# Patient Record
Sex: Female | Born: 1995 | Race: Black or African American | Hispanic: No | Marital: Single | Smoking: Never smoker
Health system: Southern US, Community
[De-identification: ages and names within clinical notes are randomized; demographics above are authoritative.]

## PROBLEM LIST (undated history)

## (undated) ENCOUNTER — Inpatient Hospital Stay (HOSPITAL_COMMUNITY): Payer: Self-pay

## (undated) DIAGNOSIS — J302 Other seasonal allergic rhinitis: Secondary | ICD-10-CM

## (undated) DIAGNOSIS — L910 Hypertrophic scar: Secondary | ICD-10-CM

## (undated) DIAGNOSIS — J40 Bronchitis, not specified as acute or chronic: Secondary | ICD-10-CM

## (undated) DIAGNOSIS — J45990 Exercise induced bronchospasm: Secondary | ICD-10-CM

## (undated) DIAGNOSIS — F329 Major depressive disorder, single episode, unspecified: Secondary | ICD-10-CM

## (undated) DIAGNOSIS — F419 Anxiety disorder, unspecified: Secondary | ICD-10-CM

## (undated) HISTORY — PX: EXTERNAL EAR SURGERY: SHX627

## (undated) HISTORY — DX: Hypertrophic scar: L91.0

## (undated) HISTORY — DX: Exercise induced bronchospasm: J45.990

## (undated) HISTORY — DX: Anxiety disorder, unspecified: F41.9

## (undated) HISTORY — DX: Other seasonal allergic rhinitis: J30.2

## (undated) HISTORY — PX: OTHER SURGICAL HISTORY: SHX169

## (undated) HISTORY — DX: Major depressive disorder, single episode, unspecified: F32.9

---
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
## 2016-02-07 ENCOUNTER — Emergency Department (HOSPITAL_COMMUNITY)
Admission: EM | Admit: 2016-02-07 | Discharge: 2016-02-08 | Disposition: A | Discharge status: Home / Self Care | Payer: Medicaid Other | Attending: Emergency Medicine | Admitting: Emergency Medicine

## 2016-02-07 ENCOUNTER — Encounter (HOSPITAL_COMMUNITY): Payer: Self-pay | Admitting: Emergency Medicine

## 2016-02-07 DIAGNOSIS — R109 Unspecified abdominal pain: Secondary | ICD-10-CM | POA: Insufficient documentation

## 2016-02-07 DIAGNOSIS — Z3202 Encounter for pregnancy test, result negative: Secondary | ICD-10-CM | POA: Insufficient documentation

## 2016-02-07 DIAGNOSIS — R110 Nausea: Secondary | ICD-10-CM | POA: Diagnosis not present

## 2016-02-07 DIAGNOSIS — F17200 Nicotine dependence, unspecified, uncomplicated: Secondary | ICD-10-CM | POA: Diagnosis not present

## 2016-02-07 DIAGNOSIS — Z7951 Long term (current) use of inhaled steroids: Secondary | ICD-10-CM | POA: Insufficient documentation

## 2016-02-07 DIAGNOSIS — R1030 Lower abdominal pain, unspecified: Secondary | ICD-10-CM | POA: Diagnosis present

## 2016-02-07 LAB — CBC
HCT: 37 % (ref 36.0–46.0)
Hemoglobin: 11.9 g/dL — ABNORMAL LOW (ref 12.0–15.0)
MCH: 28.1 pg (ref 26.0–34.0)
MCHC: 32.2 g/dL (ref 30.0–36.0)
MCV: 87.5 fL (ref 78.0–100.0)
PLATELETS: 211 10*3/uL (ref 150–400)
RBC: 4.23 MIL/uL (ref 3.87–5.11)
RDW: 12.7 % (ref 11.5–15.5)
WBC: 8.5 10*3/uL (ref 4.0–10.5)

## 2016-02-07 LAB — COMPREHENSIVE METABOLIC PANEL
ALT: 10 U/L — AB (ref 14–54)
AST: 24 U/L (ref 15–41)
Albumin: 4 g/dL (ref 3.5–5.0)
Alkaline Phosphatase: 69 U/L (ref 38–126)
Anion gap: 11 (ref 5–15)
BUN: 6 mg/dL (ref 6–20)
CALCIUM: 9.4 mg/dL (ref 8.9–10.3)
CHLORIDE: 105 mmol/L (ref 101–111)
CO2: 24 mmol/L (ref 22–32)
CREATININE: 0.72 mg/dL (ref 0.44–1.00)
Glucose, Bld: 96 mg/dL (ref 65–99)
Potassium: 3.8 mmol/L (ref 3.5–5.1)
Sodium: 140 mmol/L (ref 135–145)
Total Bilirubin: 0.6 mg/dL (ref 0.3–1.2)
Total Protein: 7.6 g/dL (ref 6.5–8.1)

## 2016-02-07 LAB — URINALYSIS, ROUTINE W REFLEX MICROSCOPIC
Bilirubin Urine: NEGATIVE
GLUCOSE, UA: NEGATIVE mg/dL
KETONES UR: NEGATIVE mg/dL
LEUKOCYTES UA: NEGATIVE
NITRITE: NEGATIVE
PROTEIN: NEGATIVE mg/dL
Specific Gravity, Urine: 1.029 (ref 1.005–1.030)
pH: 6.5 (ref 5.0–8.0)

## 2016-02-07 LAB — LIPASE, BLOOD: LIPASE: 21 U/L (ref 11–51)

## 2016-02-07 LAB — URINE MICROSCOPIC-ADD ON: WBC UA: NONE SEEN WBC/hpf (ref 0–5)

## 2016-02-07 LAB — POC URINE PREG, ED: Preg Test, Ur: NEGATIVE

## 2016-02-07 MED ORDER — IBUPROFEN 600 MG PO TABS: 600.0000 mg | ORAL_TABLET | Freq: Three times a day (TID) | ORAL | Status: DC | PRN

## 2016-02-07 MED ORDER — IBUPROFEN 400 MG PO TABS
600.0000 mg | ORAL_TABLET | Freq: Once | ORAL | Status: AC
  Administered 2016-02-07: 600 mg via ORAL
  Filled 2016-02-07: qty 1

## 2016-02-07 MED ORDER — HYDROCODONE-ACETAMINOPHEN 5-325 MG PO TABS
1.0000 | ORAL_TABLET | Freq: Once | ORAL | Status: AC
  Administered 2016-02-07: 1 via ORAL
  Filled 2016-02-07: qty 1

## 2016-02-07 MED ORDER — ONDANSETRON 8 MG PO TBDP: 8.0000 mg | ORAL_TABLET | Freq: Three times a day (TID) | ORAL | Status: DC | PRN

## 2016-02-07 NOTE — ED Provider Notes (Signed)
CSN: [id number]     Arrival date & time 02/07/16  1910 History  By signing my name below, I, Octavia Heirrianna Nassar, attest that this documentation has been prepared under the direction and in the presence of Azalia BilisKevin Eboney Claybrook, MD. Electronically Signed: Octavia HeirArianna Nassar, ED Scribe. 02/07/2016. 11:40 PM.    Chief Complaint  Patient presents with  . Abdominal Pain      The history is provided by the patient. No language interpreter was used.   HPI Comments: Kaitlin Montgomery is a 20 y.o. female who presents to the Emergency Department complaining of constant, gradual worsening, moderate lower abdominal cramps onset yesterday with associated nausea. Pt notes she started her menstrual cycle three days ago. She was having such intense cramps today that she was unable to go to work. Pt was supposed to get her depo shot on Feb 10th but missed her appointment and says she started bleeding shortly after. She says she is bleeding heavier than normal. Pt notes taking tramadol and ibuprofen to alleviate her pain with no relief. Denies fever, chills, dysuria, hematuria, or vomiting.  History reviewed. No pertinent past medical history. History reviewed. No pertinent past surgical history. Family History  Problem Relation Age of Onset  . Cancer Maternal Grandmother    Social History  Substance Use Topics  . Smoking status: Current Some Day Smoker  . Smokeless tobacco: None  . Alcohol Use: 0.0 oz/week    0 Standard drinks or equivalent per week   OB History    No data available     Review of Systems  A complete 10 system review of systems was obtained and all systems are negative except as noted in the HPI and PMH.    Allergies  Review of patient's allergies indicates no known allergies.  Home Medications   Prior to Admission medications   Medication Sig Start Date End Date Taking? Authorizing Provider  albuterol (PROVENTIL HFA;VENTOLIN HFA) 108 (90 BASE) MCG/ACT inhaler Inhale 2-4 puffs into the lungs  every 4 (four) hours as needed for wheezing (or cough). Patient not taking: Reported on 07/01/2015 04/09/15   Kalman JewelsShannon McQueen, MD  beclomethasone (QVAR) 80 MCG/ACT inhaler Inhale 1 puff into the lungs 2 (two) times daily. 07/01/15   Radene Gunningameron E Lang, MD  fluconazole (DIFLUCAN) 150 MG tablet Take 1 tablet and then take second tablet 72 hours later. Patient not taking: Reported on 07/01/2015 06/12/15   Radene Gunningameron E Lang, MD  FLUoxetine (PROZAC) 20 MG capsule Take 1 capsule (20 mg total) by mouth daily. 06/10/15 07/11/15  Kalman JewelsShannon McQueen, MD  ibuprofen (ADVIL,MOTRIN) 600 MG tablet Take 1 tab PO Q6h x 1-2 days then Q6h prn 08/17/15   Lowanda FosterMindy Brewer, NP   Triage vitals: BP 116/74 mmHg  Pulse 59  Temp(Src) 97.6 F (36.4 C) (Oral)  Resp 16  SpO2 98%  LMP 02/06/2016 Physical Exam  Constitutional: She is oriented to person, place, and time. She appears well-developed and well-nourished.  HENT:  Head: Normocephalic.  Eyes: EOM are normal.  Neck: Normal range of motion.  Pulmonary/Chest: Effort normal.  Abdominal: She exhibits no distension. There is no tenderness.  Musculoskeletal: Normal range of motion.  Neurological: She is alert and oriented to person, place, and time.  Psychiatric: She has a normal mood and affect.  Nursing note and vitals reviewed.   ED Course  Procedures  DIAGNOSTIC STUDIES: Oxygen Saturation is 98% on RA, normal by my interpretation.  COORDINATION OF CARE:  11:39 PM Discussed treatment plan which includes ibuprofen and  vicodin with pt at bedside and pt agreed to plan.  Labs Review Labs Reviewed  COMPREHENSIVE METABOLIC PANEL - Abnormal; Notable for the following:    ALT 10 (*)    All other components within normal limits  CBC - Abnormal; Notable for the following:    Hemoglobin 11.9 (*)    All other components within normal limits  URINALYSIS, ROUTINE W REFLEX MICROSCOPIC (NOT AT [facility]) - Abnormal; Notable for the following:    APPearance CLOUDY (*)    Hgb urine dipstick  LARGE (*)    All other components within normal limits  URINE MICROSCOPIC-ADD ON - Abnormal; Notable for the following:    Squamous Epithelial / LPF 0-5 (*)    Bacteria, UA RARE (*)    All other components within normal limits  LIPASE, BLOOD  POC URINE PREG, ED    Imaging Review No results found. I have personally reviewed and evaluated these images and lab results as part of my medical decision-making.   EKG Interpretation None      MDM   Final diagnoses:  None  Patient is overall well-appearing.  Vital signs are normal.  No significant abdominal tenderness to warrant additional workup at this time.  Likely related to menstrual cramping.  Pregnancy test is negative.  Labs are normal.  Discharge home in good condition.  Primary care and outpatient GYN follow-up.  She understands to return to the ER for new or worsening symptoms  I personally performed the services described in this documentation, which was scribed in my presence. The recorded information has been reviewed and is accurate.      Azalia Bilis, MD 02/07/16 [phone]

## 2016-02-07 NOTE — ED Notes (Signed)
Pt. reports low abdominal pain onset yesterday , denies nausea or vomitting , no fever or chills.

## 2016-02-07 NOTE — Discharge Instructions (Signed)

## 2016-02-07 NOTE — ED Notes (Signed)
Patient verbalized understanding of discharge instructions and denies any further needs or questions at this time. VS stable. Patient ambulatory with steady gait.  

## 2016-06-03 ENCOUNTER — Encounter: Payer: Self-pay | Admitting: Pediatrics

## 2016-06-04 ENCOUNTER — Encounter: Payer: Self-pay | Admitting: Pediatrics

## 2016-06-11 ENCOUNTER — Encounter (HOSPITAL_COMMUNITY): Payer: Self-pay | Admitting: *Deleted

## 2016-06-11 ENCOUNTER — Emergency Department (HOSPITAL_COMMUNITY)
Admission: EM | Admit: 2016-06-11 | Discharge: 2016-06-11 | Disposition: A | Discharge status: Home / Self Care | Payer: Medicaid Other | Attending: Emergency Medicine | Admitting: Emergency Medicine

## 2016-06-11 DIAGNOSIS — F17200 Nicotine dependence, unspecified, uncomplicated: Secondary | ICD-10-CM | POA: Diagnosis not present

## 2016-06-11 DIAGNOSIS — Z4800 Encounter for change or removal of nonsurgical wound dressing: Secondary | ICD-10-CM | POA: Insufficient documentation

## 2016-06-11 DIAGNOSIS — Z5189 Encounter for other specified aftercare: Secondary | ICD-10-CM

## 2016-06-11 MED ORDER — LIDOCAINE-EPINEPHRINE (PF) 2 %-1:200000 IJ SOLN
10.0000 mL | Freq: Once | INTRAMUSCULAR | Status: AC
  Administered 2016-06-11: 10 mL
  Filled 2016-06-11: qty 20

## 2016-06-11 NOTE — Discharge Instructions (Signed)
May wash wound with antibacterial soap and water. Otherwise, keep wound clean and dry. Avoid placing new jewelry in area until wound has healed. Follow up with your primary care doctor as needed. Return to the ED if you experience sever worsening of your symptoms, increased redness or swelling around your wound, fevers or chills.

## 2016-06-11 NOTE — ED Notes (Signed)
Patient able to ambulate independently  

## 2016-06-11 NOTE — ED Provider Notes (Signed)
[facility]-EMERGENCY DEPT Provider Note   CSN: [id number] Arrival date & time: 06/11/16  1528  First Provider Contact:  First MD Initiated Contact with Patient 06/11/16 1742   By signing my name below, I, Bridgette Habermann, attest that this documentation has been prepared under the direction and in the presence of Mertis Mosher, PA-C. Electronically Signed: Bridgette Habermann, ED Scribe. 06/11/16. 5:57 PM.  History   Chief Complaint Chief Complaint  Patient presents with  . Wound Check   HPI Comments: Kaitlin Montgomery is a 20 y.o. female who presents to the Emergency Department for mild swelling in the insertion site of her left lower back piercing that began to swell 2 weeks ago. Pt got the piercing a month ago. She thinks the area is "rejecting" the piercing. No drainage. Pt contacted the person who pierced the site and notes they told her to put Tylenol and witch hazel on the insertion site with no relief. Pt denies fever or any other associated symptoms. Pt denies any pain. Pt has no other complaints at this time.   The history is provided by the patient. No language interpreter was used.    History reviewed. No pertinent past medical history.  Patient Active Problem List   Diagnosis Date Noted  . Depressed mood 07/01/2015  . Anxiety 06/10/2015  . Birth control 04/09/2015  . 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 Seasonal allergies 04/09/2015  . Keloid 02/11/2015  . DUB (dysfunctional uterine bleeding) 11/30/2013    History reviewed. No pertinent surgical history.  OB History    No data available       Home Medications    Prior to Admission medications   Medication Sig Start Date End Date Taking? Authorizing Provider  ibuprofen (ADVIL,MOTRIN) 600 MG tablet Take 1 tablet (600 mg total) by mouth every 8 (eight) hours as needed. 02/07/16   Azalia Bilis, MD  ondansetron (ZOFRAN ODT) 8 MG disintegrating tablet Take 1 tablet (8 mg total) by mouth every 8 (eight) hours as needed for nausea or  vomiting. 02/07/16   Azalia Bilis, MD  traMADol (ULTRAM) 50 MG tablet Take 50 mg by mouth every 6 (six) hours as needed for moderate pain.    Historical Provider, MD    Family History Family History  Problem Relation Age of Onset  . Cancer Maternal Grandmother     Social History Social History  Substance Use Topics  . Smoking status: Current Some Day Smoker  . Smokeless tobacco: Not on file  . Alcohol use 0.0 oz/week     Allergies   Review of patient's allergies indicates no known allergies.   Review of Systems Review of Systems  Constitutional: Negative for fever.  Skin: Positive for wound.  All other systems reviewed and are negative.    Physical Exam Updated Vital Signs BP 107/72 (BP Location: Left Arm)   Pulse 80   Temp 98.1 F (36.7 C) (Oral)   Resp 14   LMP 06/03/2016   SpO2 99%   Physical Exam  Constitutional: She is oriented to person, place, and time. She appears well-developed and well-nourished. No distress.  HENT:  Head: Normocephalic and atraumatic.  Eyes: Conjunctivae are normal. Right eye exhibits no discharge. Left eye exhibits no discharge. No scleral icterus.  Cardiovascular: Normal rate.   Pulmonary/Chest: Effort normal.  Neurological: She is alert and oriented to person, place, and time. Coordination normal.  Skin: Skin is warm and dry. No rash noted. She is not diaphoretic. There is erythema. No pallor.  Right left  lower back dermal piercing with surrounding erythema.  Psychiatric: She has a normal mood and affect. Her behavior is normal.  Nursing note and vitals reviewed.    ED Treatments / Results  DIAGNOSTIC STUDIES: Oxygen Saturation is 99% on RA, normal by my interpretation.    COORDINATION OF CARE: 5:45 PM Discussed treatment plan with pt at bedside which includes removal  and pt agreed to plan.  Labs (all labs ordered are listed, but only abnormal results are displayed) Labs Reviewed - No data to display  EKG  EKG  Interpretation None       Radiology No results found.  Procedures .Foreign Body Removal Date/Time: 06/11/2016 6:15 PM Performed by: Gaylyn Rong TRIPP Authorized by: Gaylyn Rong TRIPP  Consent: Verbal consent obtained. Risks and benefits: risks, benefits and alternatives were discussed Consent given by: patient Patient understanding: patient states understanding of the procedure being performed Patient consent: the patient's understanding of the procedure matches consent given Procedure consent: procedure consent matches procedure scheduled Site marked: the operative site was marked Patient identity confirmed: verbally with patient Intake: Lower left back. Anesthesia: local infiltration  Anesthesia: Local Anesthetic: lidocaine 2% with epinephrine Patient restrained: no Patient cooperative: yes Complexity: simple 1 objects recovered. Objects recovered: Dermal piercing Post-procedure assessment: foreign body removed Patient tolerance: Patient tolerated the procedure well with no immediate complications   (including critical care time)  Medications Ordered in ED Medications - No data to display   Initial Impression / Assessment and Plan / ED Course  I have reviewed the triage vital signs and the nursing notes.  Pertinent labs & imaging results that were available during my care of the patient were reviewed by me and considered in my medical decision making (see chart for details).  Clinical Course    Patient presents with what appears to be an infected dermal piercing of her left lower back. Area was numbed with lidocaine and foreign body was removed. Patient tolerated the procedure well. No surrounding erythema. No abx indicated. Wound care discussed. Patient will follow-up with PCP as needed.  Final Clinical Impressions(s) / ED Diagnoses   Final diagnoses:  Visit for wound check    New Prescriptions New Prescriptions   No medications on file  I  personally performed the services described in this documentation, which was scribed in my presence. The recorded information has been reviewed and is accurate.      Lester Kinsman Taft, PA-C 06/12/16 4540    Arby Barrette, MD 06/13/16 [phone]

## 2016-06-11 NOTE — ED Triage Notes (Signed)
Pt has piercing to her left lower back and having mild swelling around insertion site. No redness or drainage noted.

## 2016-06-11 NOTE — ED Notes (Signed)
Suture cart at bedside 

## 2016-08-02 ENCOUNTER — Emergency Department (HOSPITAL_COMMUNITY): Payer: Medicaid Other

## 2016-08-02 ENCOUNTER — Encounter (HOSPITAL_COMMUNITY): Payer: Self-pay | Admitting: *Deleted

## 2016-08-02 ENCOUNTER — Emergency Department (HOSPITAL_COMMUNITY)
Admission: EM | Admit: 2016-08-02 | Discharge: 2016-08-02 | Disposition: A | Discharge status: Home / Self Care | Payer: Medicaid Other | Attending: Emergency Medicine | Admitting: Emergency Medicine

## 2016-08-02 DIAGNOSIS — F17200 Nicotine dependence, unspecified, uncomplicated: Secondary | ICD-10-CM | POA: Diagnosis not present

## 2016-08-02 DIAGNOSIS — J40 Bronchitis, not specified as acute or chronic: Secondary | ICD-10-CM | POA: Diagnosis not present

## 2016-08-02 DIAGNOSIS — R05 Cough: Secondary | ICD-10-CM | POA: Diagnosis present

## 2016-08-02 HISTORY — DX: Bronchitis, not specified as acute or chronic: J40

## 2016-08-02 MED ORDER — ALBUTEROL SULFATE HFA 108 (90 BASE) MCG/ACT IN AERS
2.0000 | INHALATION_SPRAY | Freq: Once | RESPIRATORY_TRACT | Status: AC
  Administered 2016-08-02: 2 via RESPIRATORY_TRACT
  Filled 2016-08-02: qty 6.7

## 2016-08-02 MED ORDER — PSEUDOEPHEDRINE HCL 30 MG PO TABS: 30.0000 mg | ORAL_TABLET | ORAL | 0 refills | Status: DC | PRN

## 2016-08-02 MED ORDER — PREDNISONE 20 MG PO TABS: 40.0000 mg | ORAL_TABLET | Freq: Every day | ORAL | 0 refills | Status: DC

## 2016-08-02 MED ORDER — CETIRIZINE HCL 10 MG PO TABS: 10.0000 mg | ORAL_TABLET | Freq: Every day | ORAL | 1 refills | Status: DC

## 2016-08-02 NOTE — ED Provider Notes (Signed)
[facility] Provider Note   CSN: [id number] Arrival date & time: 08/02/16  4098     History   Chief Complaint Chief Complaint  Patient presents with  . Cough    HPI Kaitlin Montgomery is a 20 y.o. female.  HPI Kaitlin Montgomery is a 20 y.o. female with history of seasonal allergies, exercise-induced asthma, bronchitis, presents to emergency department complaining of cough and wheezing. Patient states she has been coughing for 2 weeks. She reports associated wheezing and shortness of breath that is worse at nighttime. She reports nasal congestion and scratchy throat that is worse at nighttime and in the mornings. She has taken allergy pills and over-the-counter cold and flu medication which has not helped. She denies any known fever or chills. She reports some headache but probably because she can't sleep at night. She denies any difficulty with eating or drinking. No nausea or vomiting. No diarrhea. No chest pain.  Past Medical History:  Diagnosis Date  . Bronchitis     Patient Active Problem List   Diagnosis Date Noted  . Depressed mood 07/01/2015  . Anxiety 06/10/2015  . Birth control 04/09/2015  . 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 Seasonal allergies 04/09/2015  . Keloid 02/11/2015  . DUB (dysfunctional uterine bleeding) 11/30/2013    History reviewed. No pertinent surgical history.  OB History    No data available       Home Medications    Prior to Admission medications   Medication Sig Start Date End Date Taking? Authorizing Provider  ibuprofen (ADVIL,MOTRIN) 600 MG tablet Take 1 tablet (600 mg total) by mouth every 8 (eight) hours as needed. 02/07/16   Azalia Bilis, MD  ondansetron (ZOFRAN ODT) 8 MG disintegrating tablet Take 1 tablet (8 mg total) by mouth every 8 (eight) hours as needed for nausea or vomiting. 02/07/16   Azalia Bilis, MD  traMADol (ULTRAM) 50 MG tablet Take 50 mg by mouth every 6 (six) hours as needed for moderate pain.    Historical  Provider, MD    Family History Family History  Problem Relation Age of Onset  . Cancer Maternal Grandmother     Social History Social History  Substance Use Topics  . Smoking status: Current Some Day Smoker  . Smokeless tobacco: Never Used  . Alcohol use 0.0 oz/week     Allergies   Review of patient's allergies indicates no known allergies.   Review of Systems Review of Systems  Constitutional: Negative for chills and fever.  HENT: Positive for congestion, postnasal drip and sore throat. Negative for ear pain, mouth sores and voice change.   Respiratory: Positive for cough and wheezing. Negative for chest tightness and shortness of breath.   Cardiovascular: Negative for chest pain, palpitations and leg swelling.  Gastrointestinal: Negative for abdominal pain, diarrhea, nausea and vomiting.  Genitourinary: Negative for dysuria, flank pain, pelvic pain, vaginal bleeding, vaginal discharge and vaginal pain.  Musculoskeletal: Negative for arthralgias, myalgias, neck pain and neck stiffness.  Skin: Negative for rash.  Neurological: Negative for dizziness, weakness and headaches.  All other systems reviewed and are negative.    Physical Exam Updated Vital Signs BP 119/64 (BP Location: Left Arm)   Pulse 87   Temp 98.2 F (36.8 C) (Oral)   Resp 18   Ht 5\' 7"  (1.702 m)   Wt 68 kg   LMP 07/12/2016   SpO2 99%   BMI 23.49 kg/m   Physical Exam  Constitutional: She appears well-developed and well-nourished. No distress.  HENT:  Head: Normocephalic.  Right Ear: Tympanic membrane, external ear and ear canal normal.  Left Ear: Tympanic membrane, external ear and ear canal normal.  Nose: Mucosal edema and rhinorrhea present.  Mouth/Throat: Uvula is midline, oropharynx is clear and moist and mucous membranes are normal. No oropharyngeal exudate, posterior oropharyngeal edema, posterior oropharyngeal erythema or tonsillar abscesses.  Eyes: Conjunctivae are normal.  Neck: Neck  supple.  Cardiovascular: Normal rate, regular rhythm and normal heart sounds.   Pulmonary/Chest: Effort normal. No respiratory distress. She has wheezes. She has no rales.  Abdominal: Soft. Bowel sounds are normal. She exhibits no distension. There is no tenderness. There is no rebound.  Musculoskeletal: She exhibits no edema.  Neurological: She is alert.  Skin: Skin is warm and dry.  Psychiatric: She has a normal mood and affect. Her behavior is normal.  Nursing note and vitals reviewed.    ED Treatments / Results  Labs (all labs ordered are listed, but only abnormal results are displayed) Labs Reviewed - No data to display  EKG  EKG Interpretation None       Radiology Dg Chest 2 View  Result Date: 08/02/2016 CLINICAL DATA:  Cough and shortness of breath for the past 2 weeks. Smoker. EXAM: CHEST  2 VIEW COMPARISON:  01/03/2015. FINDINGS: Normal sized heart. Clear lungs. Mild diffuse peribronchial thickening. Unremarkable bones. IMPRESSION: Mild bronchitic changes. Electronically Signed   By: Beckie SaltsSteven  Reid M.D.   On: 08/02/2016 09:40    Procedures Procedures (including critical care time)  Medications Ordered in ED Medications  albuterol (PROVENTIL HFA;VENTOLIN HFA) 108 (90 Base) MCG/ACT inhaler 2 puff (not administered)     Initial Impression / Assessment and Plan / ED Course  I have reviewed the triage vital signs and the nursing notes.  Pertinent labs & imaging results that were available during my care of the patient were reviewed by me and considered in my medical decision making (see chart for details).  Clinical Course    Patient emergency department with nasal congestion, sore throat, cough. Symptoms for 2 weeks. Given the duration of the symptoms, will get chest x-ray to rule out pneumonia. Patient does have mild wheezing on exam, however normal vital signs. I will order her an inhaler.  cxr showing bronchitic changes. Patient is also complaining of some  allergy type symptoms. Will start on Zyrtec which she states has worked in the past, Sudafed for congestion, short course of prednisone for wheezing and cough, inhaler provided. Home with close outpatient follow-up. Patient discharged in stable condition with normal vital signs. No acute distress.  Vitals:   08/02/16 0650 08/02/16 0944  BP: 119/64 110/62  Pulse: 87 71  Resp: 18 18  Temp: 98.2 F (36.8 C) 98.1 F (36.7 C)  TempSrc: Oral Oral  SpO2: 99% 100%  Weight: 68 kg   Height: 5\' 7"  (1.702 m)      Final Clinical Impressions(s) / ED Diagnoses   Final diagnoses:  Bronchitis    New Prescriptions Discharge Medication List as of 08/02/2016  9:49 AM    START taking these medications   Details  cetirizine (ZYRTEC ALLERGY) 10 MG tablet Take 1 tablet (10 mg total) by mouth daily., Starting Sun 08/02/2016, Print    predniSONE (DELTASONE) 20 MG tablet Take 2 tablets (40 mg total) by mouth daily., Starting Sun 08/02/2016, Print    pseudoephedrine (SUDAFED) 30 MG tablet Take 1 tablet (30 mg total) by mouth every 4 (four) hours as needed for congestion., Starting Sun 08/02/2016, Print  Jaynie Crumble, PA-C 08/02/16 1142    Glynn Octave, MD 08/02/16 1243

## 2016-08-02 NOTE — Discharge Instructions (Signed)
Start zyrtec daily. Take sudafed in addition for congestion. Use inhaler 2 puffs every 4 hrs or as needed for wheezing and shortness of breath. Prednisone as prescribed until all gone. Follow up with your doctor for recheck.

## 2016-08-02 NOTE — ED Triage Notes (Signed)
The pt has had a cough for 2 weeks  She  Has a cold and is coughing up green thin sputum  She is a smoker  lmp sept 1st

## 2016-08-02 NOTE — ED Notes (Signed)
Pt escorted to xray.

## 2016-08-23 ENCOUNTER — Emergency Department (HOSPITAL_COMMUNITY)
Admission: EM | Admit: 2016-08-23 | Discharge: 2016-08-23 | Disposition: A | Discharge status: Home / Self Care | Payer: Medicaid Other | Attending: Emergency Medicine | Admitting: Emergency Medicine

## 2016-08-23 ENCOUNTER — Encounter (HOSPITAL_COMMUNITY): Payer: Self-pay | Admitting: *Deleted

## 2016-08-23 DIAGNOSIS — O26891 Other specified pregnancy related conditions, first trimester: Secondary | ICD-10-CM | POA: Insufficient documentation

## 2016-08-23 DIAGNOSIS — Z3A01 Less than 8 weeks gestation of pregnancy: Secondary | ICD-10-CM | POA: Diagnosis not present

## 2016-08-23 DIAGNOSIS — R109 Unspecified abdominal pain: Secondary | ICD-10-CM | POA: Diagnosis not present

## 2016-08-23 DIAGNOSIS — Z5321 Procedure and treatment not carried out due to patient leaving prior to being seen by health care provider: Secondary | ICD-10-CM | POA: Insufficient documentation

## 2016-08-23 LAB — COMPREHENSIVE METABOLIC PANEL
ALK PHOS: 47 U/L (ref 38–126)
ALT: 8 U/L — AB (ref 14–54)
AST: 17 U/L (ref 15–41)
Albumin: 4 g/dL (ref 3.5–5.0)
Anion gap: 7 (ref 5–15)
BUN: 6 mg/dL (ref 6–20)
CALCIUM: 9.4 mg/dL (ref 8.9–10.3)
CO2: 22 mmol/L (ref 22–32)
CREATININE: 0.67 mg/dL (ref 0.44–1.00)
Chloride: 106 mmol/L (ref 101–111)
Glucose, Bld: 88 mg/dL (ref 65–99)
Potassium: 3.9 mmol/L (ref 3.5–5.1)
Sodium: 135 mmol/L (ref 135–145)
Total Bilirubin: 0.4 mg/dL (ref 0.3–1.2)
Total Protein: 6.8 g/dL (ref 6.5–8.1)

## 2016-08-23 LAB — URINALYSIS, ROUTINE W REFLEX MICROSCOPIC
Bilirubin Urine: NEGATIVE
Glucose, UA: NEGATIVE mg/dL
HGB URINE DIPSTICK: NEGATIVE
KETONES UR: NEGATIVE mg/dL
Nitrite: NEGATIVE
PROTEIN: NEGATIVE mg/dL
Specific Gravity, Urine: 1.02 (ref 1.005–1.030)
pH: 7 (ref 5.0–8.0)

## 2016-08-23 LAB — CBC
HCT: 33.7 % — ABNORMAL LOW (ref 36.0–46.0)
Hemoglobin: 11 g/dL — ABNORMAL LOW (ref 12.0–15.0)
MCH: 28.3 pg (ref 26.0–34.0)
MCHC: 32.6 g/dL (ref 30.0–36.0)
MCV: 86.6 fL (ref 78.0–100.0)
PLATELETS: 197 10*3/uL (ref 150–400)
RBC: 3.89 MIL/uL (ref 3.87–5.11)
RDW: 13.3 % (ref 11.5–15.5)
WBC: 8.7 10*3/uL (ref 4.0–10.5)

## 2016-08-23 LAB — LIPASE, BLOOD: Lipase: 22 U/L (ref 11–51)

## 2016-08-23 LAB — URINE MICROSCOPIC-ADD ON

## 2016-08-23 LAB — HCG, QUANTITATIVE, PREGNANCY: HCG, BETA CHAIN, QUANT, S: 35777 m[IU]/mL — AB (ref <5)

## 2016-08-23 LAB — POC URINE PREG, ED: Preg Test, Ur: POSITIVE — AB

## 2016-08-23 NOTE — ED Notes (Signed)
Registration states pt left 

## 2016-08-23 NOTE — ED Triage Notes (Signed)
Ptc/o lower abd and back pain x 3 weeks. Reports blood on tissues one one episode. Pt has had a positive preg test at home. LMP 9/2

## 2016-08-27 ENCOUNTER — Ambulatory Visit (INDEPENDENT_AMBULATORY_CARE_PROVIDER_SITE_OTHER): Payer: Medicaid Other | Admitting: *Deleted

## 2016-08-27 DIAGNOSIS — Z3201 Encounter for pregnancy test, result positive: Secondary | ICD-10-CM | POA: Diagnosis not present

## 2016-08-27 DIAGNOSIS — N912 Amenorrhea, unspecified: Secondary | ICD-10-CM

## 2016-08-27 LAB — POCT URINE PREGNANCY: Preg Test, Ur: POSITIVE — AB

## 2016-09-14 ENCOUNTER — Other Ambulatory Visit (HOSPITAL_COMMUNITY)
Admission: RE | Admit: 2016-09-14 | Discharge: 2016-09-14 | Disposition: A | Discharge status: Home / Self Care | Payer: Medicaid Other | Source: Ambulatory Visit | Attending: Obstetrics & Gynecology | Admitting: Obstetrics & Gynecology

## 2016-09-14 ENCOUNTER — Encounter: Payer: Self-pay | Admitting: Obstetrics & Gynecology

## 2016-09-14 ENCOUNTER — Ambulatory Visit (INDEPENDENT_AMBULATORY_CARE_PROVIDER_SITE_OTHER): Payer: Medicaid Other | Admitting: Obstetrics & Gynecology

## 2016-09-14 VITALS — BP 119/79 | HR 106 | Temp 97.4°F | Wt 153.8 lb

## 2016-09-14 DIAGNOSIS — Z113 Encounter for screening for infections with a predominantly sexual mode of transmission: Secondary | ICD-10-CM | POA: Insufficient documentation

## 2016-09-14 DIAGNOSIS — Z3401 Encounter for supervision of normal first pregnancy, first trimester: Secondary | ICD-10-CM

## 2016-09-14 DIAGNOSIS — Z3400 Encounter for supervision of normal first pregnancy, unspecified trimester: Secondary | ICD-10-CM | POA: Insufficient documentation

## 2016-09-14 DIAGNOSIS — O219 Vomiting of pregnancy, unspecified: Secondary | ICD-10-CM

## 2016-09-14 LAB — POCT URINALYSIS DIPSTICK
BILIRUBIN UA: NEGATIVE
Blood, UA: NEGATIVE
Glucose, UA: NEGATIVE
KETONES UA: NEGATIVE
NITRITE UA: NEGATIVE
PROTEIN UA: NEGATIVE
Spec Grav, UA: <=1.005
Urobilinogen, UA: NEGATIVE
pH, UA: 8

## 2016-09-14 MED ORDER — PROMETHAZINE HCL 25 MG PO TABS: 25.0000 mg | ORAL_TABLET | Freq: Four times a day (QID) | ORAL | 2 refills | Status: DC | PRN

## 2016-09-14 MED ORDER — PREPLUS 27-1 MG PO TABS: 1.0000 | ORAL_TABLET | Freq: Every day | ORAL | 13 refills | Status: DC

## 2016-09-14 NOTE — Patient Instructions (Signed)
Thank you for enrolling in MyChart. Please follow the instructions below to securely access your online medical record. MyChart allows you to send messages to your doctor, view your test results, manage appointments, and more.   How Do I Sign Up? 1. In your Internet browser, go to [employer] and enter [email or web address]. 2. Click on the Sign Up Now link in the Sign In box. You will see the New Member Sign Up page. 3. Enter your MyChart Access Code exactly as it appears below. You will not need to use this code after you've completed the sign-up process. If you do not sign up before the expiration date, you must request a new code.  MyChart Access Code: [email or web address] Expires: 10/26/2016 10:30 AM  4. Enter your Social Security Number ([id number]) and Date of Birth (mm/dd/yyyy) as indicated and click Submit. You will be taken to the next sign-up page. 5. Create a MyChart ID. This will be your MyChart login ID and cannot be changed, so think of one that is secure and easy to remember. 6. Create a MyChart password. You can change your password at any time. 7. Enter your Password Reset Question and Answer. This can be used at a later time if you forget your password.  8. Enter your e-mail address. You will receive e-mail notification when new information is available in MyChart. 9. Click Sign Up. You can now view your medical record.   Additional Information Remember, MyChart is NOT to be used for urgent needs. For medical emergencies, dial 911.   First Trimester of Pregnancy The first trimester of pregnancy is from week 1 until the end of week 12 (months 1 through 3). A week after a sperm fertilizes an egg, the egg will implant on the wall of the uterus. This embryo will begin to develop into a baby. Genes from you and your partner are forming the baby. The female genes determine whether the baby is a boy or a girl. At 6-8 weeks, the eyes and face are formed, and the heartbeat  can be seen on ultrasound. At the end of 12 weeks, all the baby's organs are formed.  Now that you are pregnant, you will want to do everything you can to have a healthy baby. Two of the most important things are to get good prenatal care and to follow your health care provider's instructions. Prenatal care is all the medical care you receive before the baby's birth. This care will help prevent, find, and treat any problems during the pregnancy and childbirth. BODY CHANGES Your body goes through many changes during pregnancy. The changes vary from woman to woman.   You may gain or lose a couple of pounds at first.  You may feel sick to your stomach (nauseous) and throw up (vomit). If the vomiting is uncontrollable, call your health care provider.  You may tire easily.  You may develop headaches that can be relieved by medicines approved by your health care provider.  You may urinate more often. Painful urination may mean you have a bladder infection.  You may develop heartburn as a result of your pregnancy.  You may develop constipation because certain hormones are causing the muscles that push waste through your intestines to slow down.  You may develop hemorrhoids or swollen, bulging veins (varicose veins).  Your breasts may begin to grow larger and become tender. Your nipples may stick out more, and the tissue that surrounds them (areola) may become darker.  Your gums may bleed  and may be sensitive to brushing and flossing.  Dark spots or blotches (chloasma, mask of pregnancy) may develop on your face. This will likely fade after the baby is born.  Your menstrual periods will stop.  You may have a loss of appetite.  You may develop cravings for certain kinds of food.  You may have changes in your emotions from day to day, such as being excited to be pregnant or being concerned that something may go wrong with the pregnancy and baby.  You may have more vivid and strange  dreams.  You may have changes in your hair. These can include thickening of your hair, rapid growth, and changes in texture. Some women also have hair loss during or after pregnancy, or hair that feels dry or thin. Your hair will most likely return to normal after your baby is born. WHAT TO EXPECT AT YOUR PRENATAL VISITS During a routine prenatal visit:  You will be weighed to make sure you and the baby are growing normally.  Your blood pressure will be taken.  Your abdomen will be measured to track your baby's growth.  The fetal heartbeat will be listened to starting around week 10 or 12 of your pregnancy.  Test results from any previous visits will be discussed. Your health care provider may ask you:  How you are feeling.  If you are feeling the baby move.  If you have had any abnormal symptoms, such as leaking fluid, bleeding, severe headaches, or abdominal cramping.  If you are using any tobacco products, including cigarettes, chewing tobacco, and electronic cigarettes.  If you have any questions. Other tests that may be performed during your first trimester include:  Blood tests to find your blood type and to check for the presence of any previous infections. They will also be used to check for low iron levels (anemia) and Rh antibodies. Later in the pregnancy, blood tests for diabetes will be done along with other tests if problems develop.  Urine tests to check for infections, diabetes, or protein in the urine.  An ultrasound to confirm the proper growth and development of the baby.  An amniocentesis to check for possible genetic problems.  Fetal screens for spina bifida and Down syndrome.  You may need other tests to make sure you and the baby are doing well.  HIV (human immunodeficiency virus) testing. Routine prenatal testing includes screening for HIV, unless you choose not to have this test. HOME CARE INSTRUCTIONS  Medicines  Follow your health care provider's  instructions regarding medicine use. Specific medicines may be either safe or unsafe to take during pregnancy.  Take your prenatal vitamins as directed.  If you develop constipation, try taking a stool softener if your health care provider approves. Diet  Eat regular, well-balanced meals. Choose a variety of foods, such as meat or vegetable-based protein, fish, milk and low-fat dairy products, vegetables, fruits, and whole grain breads and cereals. Your health care provider will help you determine the amount of weight gain that is right for you.  Avoid raw meat and uncooked cheese. These carry germs that can cause birth defects in the baby.  Eating four or five small meals rather than three large meals a day may help relieve nausea and vomiting. If you start to feel nauseous, eating a few soda crackers can be helpful. Drinking liquids between meals instead of during meals also seems to help nausea and vomiting.  If you develop constipation, eat more high-fiber foods, such as  fresh vegetables or fruit and whole grains. Drink enough fluids to keep your urine clear or pale yellow. Activity and Exercise  Exercise only as directed by your health care provider. Exercising will help you:  Control your weight.  Stay in shape.  Be prepared for labor and delivery.  Experiencing pain or cramping in the lower abdomen or low back is a good sign that you should stop exercising. Check with your health care provider before continuing normal exercises.  Try to avoid standing for long periods of time. Move your legs often if you must stand in one place for a long time.  Avoid heavy lifting.  Wear low-heeled shoes, and practice good posture.  You may continue to have sex unless your health care provider directs you otherwise. Relief of Pain or Discomfort  Wear a good support bra for breast tenderness.   Take warm sitz baths to soothe any pain or discomfort caused by hemorrhoids. Use hemorrhoid  cream if your health care provider approves.   Rest with your legs elevated if you have leg cramps or low back pain.  If you develop varicose veins in your legs, wear support hose. Elevate your feet for 15 minutes, 3-4 times a day. Limit salt in your diet. Prenatal Care  Schedule your prenatal visits by the twelfth week of pregnancy. They are usually scheduled monthly at first, then more often in the last 2 months before delivery.  Write down your questions. Take them to your prenatal visits.  Keep all your prenatal visits as directed by your health care provider. Safety  Wear your seat belt at all times when driving.  Make a list of emergency phone numbers, including numbers for family, friends, the hospital, and police and fire departments. General Tips  Ask your health care provider for a referral to a local prenatal education class. Begin classes no later than at the beginning of month 6 of your pregnancy.  Ask for help if you have counseling or nutritional needs during pregnancy. Your health care provider can offer advice or refer you to specialists for help with various needs.  Do not use hot tubs, steam rooms, or saunas.  Do not douche or use tampons or scented sanitary pads.  Do not cross your legs for long periods of time.  Avoid cat litter boxes and soil used by cats. These carry germs that can cause birth defects in the baby and possibly loss of the fetus by miscarriage or stillbirth.  Avoid all smoking, herbs, alcohol, and medicines not prescribed by your health care provider. Chemicals in these affect the formation and growth of the baby.  Do not use any tobacco products, including cigarettes, chewing tobacco, and electronic cigarettes. If you need help quitting, ask your health care provider. You may receive counseling support and other resources to help you quit.  Schedule a dentist appointment. At home, brush your teeth with a soft toothbrush and be gentle when you  floss. SEEK MEDICAL CARE IF:   You have dizziness.  You have mild pelvic cramps, pelvic pressure, or nagging pain in the abdominal area.  You have persistent nausea, vomiting, or diarrhea.  You have a bad smelling vaginal discharge.  You have pain with urination.  You notice increased swelling in your face, hands, legs, or ankles. SEEK IMMEDIATE MEDICAL CARE IF:   You have a fever.  You are leaking fluid from your vagina.  You have spotting or bleeding from your vagina.  You have severe abdominal cramping or  pain.  You have rapid weight gain or loss.  You vomit blood or material that looks like coffee grounds.  You are exposed to [place] measles and have never had them.  You are exposed to fifth disease or chickenpox.  You develop a severe headache.  You have shortness of breath.  You have any kind of trauma, such as from a fall or a car accident.   This information is not intended to replace advice given to you by your health care provider. Make sure you discuss any questions you have with your health care provider.   Document Released: 10/20/2001 Document Revised: 11/16/2014 Document Reviewed: 09/05/2013 Elsevier Interactive Patient Education [employer]2016 Elsevier Inc.

## 2016-09-14 NOTE — Addendum Note (Signed)
Addended by: Natale MilchSTALLING, Ellamae Lybeck D on: 09/14/2016 02:46 PM   Modules accepted: Orders

## 2016-09-14 NOTE — Progress Notes (Signed)
Subjective:   Nicolette BangDestiny Rodelo is a 20 y.o. G1P0 at 7227w3d by LMP being seen today for her first obstetrical visit.  Her obstetrical history is significant for being a teenager. Patient does intend to breast feed. Pregnancy history fully reviewed.  Patient reports nausea and vomiting, desires other medication as Vitamin B6 + Unisom is not working.  HISTORY: Obstetric History   G1   P0   T0   P0   A0   L0    SAB0   TAB0   Ectopic0   Multiple0   Live Births0     # Outcome Date GA Lbr Len/2nd Weight Sex Delivery Anes PTL Lv  1 Current              Past Medical History:  Diagnosis Date  . Anxiety 06/10/2015  . Bronchitis   . Bronchitis   . Depressed mood 07/01/2015  . 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 Keloid 02/11/2015  . Seasonal allergies 04/09/2015   History reviewed. No pertinent surgical history. Family History  Problem Relation Age of Onset  . Cancer Maternal Grandmother    Social History  Substance Use Topics  . Smoking status: Former Smoker    Types: Cigars    Quit date: 08/25/2016  . Smokeless tobacco: Never Used  . Alcohol use No   No Known Allergies Current Outpatient Prescriptions on File Prior to Visit  Medication Sig Dispense Refill  . cetirizine (ZYRTEC ALLERGY) 10 MG tablet Take 1 tablet (10 mg total) by mouth daily. (Patient not taking: Reported on 09/14/2016) 30 tablet 1  . ibuprofen (ADVIL,MOTRIN) 600 MG tablet Take 1 tablet (600 mg total) by mouth every 8 (eight) hours as needed. (Patient not taking: Reported on 09/14/2016) 15 tablet 0  . ondansetron (ZOFRAN ODT) 8 MG disintegrating tablet Take 1 tablet (8 mg total) by mouth every 8 (eight) hours as needed for nausea or vomiting. (Patient not taking: Reported on 09/14/2016) 12 tablet 0  . predniSONE (DELTASONE) 20 MG tablet Take 2 tablets (40 mg total) by mouth daily. (Patient not taking: Reported on 09/14/2016) 10 tablet 0  . pseudoephedrine (SUDAFED) 30 MG tablet Take 1 tablet (30 mg total) by mouth every  4 (four) hours as needed for congestion. (Patient not taking: Reported on 09/14/2016) 15 tablet 0  . traMADol (ULTRAM) 50 MG tablet Take 50 mg by mouth every 6 (six) hours as needed for moderate pain.     No current facility-administered medications on file prior to visit.      Exam   Vitals:   09/14/16 1355  BP: 119/79  Pulse: (!) 106  Temp: 97.4 F (36.3 C)  Weight: 153 lb 12.8 oz (69.8 kg)   Fetal Heart Rate (bpm): + on u/s. Bedside u/s showed SIUP with +heartbeat.  System: Breast:  normal appearance, no masses or tenderness   Skin: normal coloration and turgor, no rashes   Neurologic: oriented, normal, negative, normal mood   Extremities: normal strength, tone, and muscle mass, ROM of all joints is normal   HEENT PERRLA, extra ocular movement intact and sclera clear, anicteric   Mouth/Teeth mucous membranes moist, pharynx normal without lesions and dental hygiene good   Neck supple and no masses   Cardiovascular: regular rate and rhythm   Respiratory:  appears well, vitals normal, no respiratory distress, normal breath sounds   Abdomen: soft, non-tender; bowel sounds normal; no masses,  no organomegaly     Assessment:   Pregnancy: G1P0 Patient Active Problem  List   Diagnosis Date Noted  . Supervision of normal first teen pregnancy 09/14/2016  . Depressed mood 07/01/2015  . Anxiety 06/10/2015     Plan:  1. Nausea and vomiting during pregnancy Antiemetic prescribed. - promethazine (PHENERGAN) 25 MG tablet; Take 1 tablet (25 mg total) by mouth every 6 (six) hours as needed for nausea or vomiting.  Dispense: 30 tablet; Refill: 2  2. Supervision of normal first teen pregnancy in first trimester Initial labs drawn, prenatal vitamins prescribed. - GC/Chlamydia probe amp ([place] - Culture, OB Urine - ToxASSURE Select 13 (MW), Urine - Hemoglobinopathy evaluation - US MFM Fetal Nuchal Translucency; Future - Obstetric Panel, Including HIV - Prenatal  Vit-Fe Fumarate-FA (PREPLUS) 27-1 MG TABS; Take 1 tablet by mouth daily.  Dispense: 30 tablet; Refill: 13 Genetic Screening discussed, First trimester screen: ordered. Ultrasound discussed; fetal anatomic survey: to be ordered later. Problem list reviewed and updated. The nature [facility] with multiple MDs and other Advanced Practice Providers was explained to patient; also emphasized that residents, students are part of our team. Routine obstetric precautions reviewed. Follow up in 4 weeks.    Jaynie CollinsUGONNA  ANYANWU, MD, FACOG Attending Obstetrician & Gynecologist, [facility], [facility]

## 2016-09-15 LAB — GC/CHLAMYDIA PROBE AMP (~~LOC~~) NOT AT ARMC
CHLAMYDIA, DNA PROBE: NEGATIVE
Neisseria Gonorrhea: NEGATIVE

## 2016-09-16 ENCOUNTER — Encounter (HOSPITAL_COMMUNITY): Payer: Self-pay | Admitting: *Deleted

## 2016-09-16 ENCOUNTER — Inpatient Hospital Stay (HOSPITAL_COMMUNITY)
Admission: AD | Admit: 2016-09-16 | Discharge: 2016-09-16 | Disposition: A | Discharge status: Home / Self Care | Payer: Medicaid Other | Source: Ambulatory Visit | Attending: Obstetrics & Gynecology | Admitting: Obstetrics & Gynecology

## 2016-09-16 DIAGNOSIS — Y93E1 Activity, personal bathing and showering: Secondary | ICD-10-CM | POA: Insufficient documentation

## 2016-09-16 DIAGNOSIS — R109 Unspecified abdominal pain: Secondary | ICD-10-CM | POA: Diagnosis present

## 2016-09-16 DIAGNOSIS — O209 Hemorrhage in early pregnancy, unspecified: Secondary | ICD-10-CM | POA: Diagnosis not present

## 2016-09-16 DIAGNOSIS — O9A211 Injury, poisoning and certain other consequences of external causes complicating pregnancy, first trimester: Secondary | ICD-10-CM

## 2016-09-16 DIAGNOSIS — W182XXA Fall in (into) shower or empty bathtub, initial encounter: Secondary | ICD-10-CM | POA: Insufficient documentation

## 2016-09-16 DIAGNOSIS — O26891 Other specified pregnancy related conditions, first trimester: Secondary | ICD-10-CM

## 2016-09-16 DIAGNOSIS — Z87891 Personal history of nicotine dependence: Secondary | ICD-10-CM | POA: Insufficient documentation

## 2016-09-16 DIAGNOSIS — Z3A09 9 weeks gestation of pregnancy: Secondary | ICD-10-CM | POA: Diagnosis not present

## 2016-09-16 LAB — URINALYSIS, ROUTINE W REFLEX MICROSCOPIC
BILIRUBIN URINE: NEGATIVE
GLUCOSE, UA: NEGATIVE mg/dL
Hgb urine dipstick: NEGATIVE
KETONES UR: 40 mg/dL — AB
Nitrite: NEGATIVE
PROTEIN: NEGATIVE mg/dL
Specific Gravity, Urine: 1.015 (ref 1.005–1.030)
pH: 7 (ref 5.0–8.0)

## 2016-09-16 LAB — URINE MICROSCOPIC-ADD ON

## 2016-09-16 NOTE — MAU Note (Signed)
Pt states she fell getting out of the shower last night, hit chest & abdomen on the toilet.  Started having lower abd pain last night, took tylenol.  Started vomiting & spotting this a.m., pain continues.

## 2016-09-16 NOTE — MAU Provider Note (Signed)
Faculty Practice OB/GYN MAU Attending Note  History     CSN: [id number]  Arrival date & time 09/16/16  1657   First Provider Initiated Contact with Patient 09/16/16 1836      Chief Complaint  Patient presents with  . Fall  . Abdominal Pain    Kaitlin Montgomery is a 20 y.o. G2P0010 at 301w5d who presents to MAU today for evaluation of vaginal spotting, abdominal pain after fall.  [facility] for initial OB visit. According to patient and RN note: "She fell getting out of the shower last night, hit chest & abdomen on the toilet.  Started having lower abdominal pain last night, took tylenol.  Started vomiting & spotting this a.m., pain continues." She currently complains of cramping, and some nausea.  Had some brown spotting, no current bleeding currently.  She is very worried about baby.  Accompanied by her mother. Denies any abnormal vaginal discharge, fevers, chills, sweats, dysuria, other GI or GU symptoms or other general symptoms.   Obstetric History   G2   P0   T0   P0   A1   L0    SAB1   TAB0   Ectopic0   Multiple0   Live Births0     # Outcome Date GA Lbr Len/2nd Weight Sex Delivery Anes PTL Lv  2 Current           1 SAB               Past Medical History:  Diagnosis Date  . Anxiety 06/10/2015  . Bronchitis   . Bronchitis   . Depressed mood 07/01/2015  . 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 Keloid 02/11/2015  . Seasonal allergies 04/09/2015    Past Surgical History:  Procedure Laterality Date  . EXTERNAL EAR SURGERY Right     Family History  Problem Relation Age of Onset  . Cancer Maternal Grandmother     Social History  Substance Use Topics  . Smoking status: Former Smoker    Types: Cigars    Quit date: 08/25/2016  . Smokeless tobacco: Never Used  . Alcohol use No    No Known Allergies  Prescriptions Prior to Admission  Medication Sig Dispense Refill Last Dose  . acetaminophen (TYLENOL) 325 MG tablet Take 325 mg by mouth every 6 (six)  hours as needed for moderate pain.   09/15/2016 at Unknown time  . Meclizine HCl (BONINE PO) Take 1 tablet by mouth daily as needed (motion sickness).   Past Week at Unknown time  . cetirizine (ZYRTEC ALLERGY) 10 MG tablet Take 1 tablet (10 mg total) by mouth daily. (Patient not taking: Reported on 09/14/2016) 30 tablet 1 Not Taking  . ibuprofen (ADVIL,MOTRIN) 600 MG tablet Take 1 tablet (600 mg total) by mouth every 8 (eight) hours as needed. (Patient not taking: Reported on 09/14/2016) 15 tablet 0 Not Taking  . ondansetron (ZOFRAN ODT) 8 MG disintegrating tablet Take 1 tablet (8 mg total) by mouth every 8 (eight) hours as needed for nausea or vomiting. (Patient not taking: Reported on 09/14/2016) 12 tablet 0 Not Taking  . predniSONE (DELTASONE) 20 MG tablet Take 2 tablets (40 mg total) by mouth daily. (Patient not taking: Reported on 09/14/2016) 10 tablet 0 Not Taking  . Prenatal Vit-Fe Fumarate-FA (PREPLUS) 27-1 MG TABS Take 1 tablet by mouth daily. (Patient not taking: Reported on 09/16/2016) 30 tablet 13 Not Taking at Unknown time  . promethazine (PHENERGAN) 25 MG tablet Take 1 tablet (  25 mg total) by mouth every 6 (six) hours as needed for nausea or vomiting. (Patient not taking: Reported on 09/16/2016) 30 tablet 2 Not Taking at Unknown time  . pseudoephedrine (SUDAFED) 30 MG tablet Take 1 tablet (30 mg total) by mouth every 4 (four) hours as needed for congestion. (Patient not taking: Reported on 09/14/2016) 15 tablet 0 Not Taking     Physical Exam  BP 107/58 (BP Location: Right Arm)   Pulse 90   Temp 98 F (36.7 C) (Oral)   Resp 16   Ht 5\' 7"  (1.702 m)   Wt 150 lb (68 kg)   LMP 07/10/2016   BMI 23.49 kg/m  GENERAL: Well-developed, well-nourished female in no acute distress  SKIN: Warm, dry and without erythema PSYCH: Normal mood and affect HEENT: Normocephalic, atraumatic.   LUNGS: Normal respiratory effort, normal breath sounds HEART: Regular rate noted ABDOMEN: Soft, nondistended,  nontender PELVIC: Scant brown blood on pad EXTREMITIES: No edema, no cyanosis, normal range of movement  MAU Course/MDM  Bedside ultrasound showed viable SIUP, +fetal movement, normal AFV, CRL measured [redacted]w[redacted]d c/w with LMP.  Patient and mother reassured.   Labs and Imaging   Results for orders placed or performed during the hospital encounter of 09/16/16 (from the past 24 hour(s))  Urinalysis, Routine w reflex microscopic (not at [facility])     Status: Abnormal   Collection Time: 09/16/16  5:15 PM  Result Value Ref Range   Color, Urine YELLOW YELLOW   APPearance CLOUDY (A) CLEAR   Specific Gravity, Urine 1.015 1.005 - 1.030   pH 7.0 5.0 - 8.0   Glucose, UA NEGATIVE NEGATIVE mg/dL   Hgb urine dipstick NEGATIVE NEGATIVE   Bilirubin Urine NEGATIVE NEGATIVE   Ketones, ur 40 (A) NEGATIVE mg/dL   Protein, ur NEGATIVE NEGATIVE mg/dL   Nitrite NEGATIVE NEGATIVE   Leukocytes, UA MODERATE (A) NEGATIVE  Urine microscopic-add on     Status: Abnormal   Collection Time: 09/16/16  5:15 PM  Result Value Ref Range   Squamous Epithelial / LPF 6-30 (A) NONE SEEN   WBC, UA 0-5 0 - 5 WBC/hpf   RBC / HPF 0-5 0 - 5 RBC/hpf   Bacteria, UA FEW (A) NONE SEEN   Urine-Other AMORPHOUS URATES/PHOSPHATES    No results found.  Assessment and Plan   1. Abdominal pain during pregnancy, first trimester   2. First trimester bleeding   3. Traumatic injury during pregnancy, antepartum, first trimester     IUP at [redacted]w[redacted]d Told to take Tylenol for pain; take Phenergan as prescribed for nausea.  Was told to return to MAU for any pain, bleeding or other concerns, or if her condition were to change or worsen. Discharged to home in stable condition      Medication List    STOP taking these medications   cetirizine 10 MG tablet Commonly known as:  ZYRTEC ALLERGY   ibuprofen 600 MG tablet Commonly known as:  ADVIL,MOTRIN   ondansetron 8 MG disintegrating tablet Commonly known as:  ZOFRAN ODT   predniSONE 20 MG  tablet Commonly known as:  DELTASONE   pseudoephedrine 30 MG tablet Commonly known as:  SUDAFED     TAKE these medications   acetaminophen 325 MG tablet Commonly known as:  TYLENOL Take 325 mg by mouth every 6 (six) hours as needed for moderate pain.   BONINE PO Take 1 tablet by mouth daily as needed (motion sickness).   PREPLUS 27-1 MG Tabs Take 1 tablet by  mouth daily.   promethazine 25 MG tablet Commonly known as:  PHENERGAN Take 1 tablet (25 mg total) by mouth every 6 (six) hours as needed for nausea or vomiting.        Jaynie CollinsUGONNA  Myka Lukins, MD, FACOG Attending Obstetrician & Gynecologist, [facility], [facility]

## 2016-09-16 NOTE — Discharge Instructions (Signed)
Vaginal Bleeding During Pregnancy, First Trimester A small amount of bleeding (spotting) from the vagina is relatively common in early pregnancy. It usually stops on its own. Various things may cause bleeding or spotting in early pregnancy. Some bleeding may be related to the pregnancy, and some may not. In most cases, the bleeding is normal and is not a problem. However, bleeding can also be a sign of something serious. Be sure to tell your health care provider about any vaginal bleeding right away. Some possible causes of vaginal bleeding during the first trimester include:  Infection or inflammation of the cervix.  Growths (polyps) on the cervix.  Miscarriage or threatened miscarriage.  Trauma or fall.  Pregnancy tissue has developed outside of the uterus and in a fallopian tube (tubal pregnancy).  Tiny cysts have developed in the uterus instead of pregnancy tissue (molar pregnancy). HOME CARE INSTRUCTIONS  Watch your condition for any changes. The following actions may help to lessen any discomfort you are feeling:  Follow your health care provider's instructions for limiting your activity. If your health care provider orders bed rest, you may need to stay in bed and only get up to use the bathroom. However, your health care provider may allow you to continue light activity.  If needed, make plans for someone to help with your regular activities and responsibilities while you are on bed rest.  Keep track of the number of pads you use each day, how often you change pads, and how soaked (saturated) they are. Write this down.  Do not use tampons. Do not douche.  Do not have sexual intercourse or orgasms until approved by your health care provider.  If you pass any tissue from your vagina, save the tissue so you can show it to your health care provider.  Only take over-the-counter or prescription medicines as directed by your health care provider.  Do not take aspirin because it can  make you bleed.  Keep all follow-up appointments as directed by your health care provider. SEEK MEDICAL CARE IF:  You have any vaginal bleeding during any part of your pregnancy.  You have cramps or labor pains.  You have a fever, not controlled by medicine. SEEK IMMEDIATE MEDICAL CARE IF:   You have severe cramps in your back or belly (abdomen).  You pass large clots or tissue from your vagina.  Your bleeding increases.  You feel light-headed or weak, or you have fainting episodes.  You have chills.  You are leaking fluid or have a gush of fluid from your vagina.  You pass out while having a bowel movement. MAKE SURE YOU:  Understand these instructions.  Will watch your condition.  Will get help right away if you are not doing well or get worse.   This information is not intended to replace advice given to you by your health care provider. Make sure you discuss any questions you have with your health care provider.   Document Released: 08/05/2005 Document Revised: 10/31/2013 Document Reviewed: 07/03/2013 Elsevier Interactive Patient Education [employer]2016 [employer] Inc.

## 2016-09-18 ENCOUNTER — Encounter (HOSPITAL_COMMUNITY): Payer: Self-pay | Admitting: Obstetrics & Gynecology

## 2016-09-18 LAB — OBSTETRIC PANEL, INCLUDING HIV
ANTIBODY SCREEN: NEGATIVE
BASOS ABS: 0 10*3/uL (ref 0.0–0.2)
BASOS: 0 %
EOS (ABSOLUTE): 0.2 10*3/uL (ref 0.0–0.4)
Eos: 3 %
HEMATOCRIT: 32.2 % — AB (ref 34.0–46.6)
HIV SCREEN 4TH GENERATION: NONREACTIVE
Hemoglobin: 10.9 g/dL — ABNORMAL LOW (ref 11.1–15.9)
Hepatitis B Surface Ag: NEGATIVE
Immature Grans (Abs): 0 10*3/uL (ref 0.0–0.1)
Immature Granulocytes: 0 %
LYMPHS ABS: 1.7 10*3/uL (ref 0.7–3.1)
Lymphs: 23 %
MCH: 29 pg (ref 26.6–33.0)
MCHC: 33.9 g/dL (ref 31.5–35.7)
MCV: 86 fL (ref 79–97)
Monocytes Absolute: 0.4 10*3/uL (ref 0.1–0.9)
Monocytes: 5 %
NEUTROS ABS: 5.2 10*3/uL (ref 1.4–7.0)
Neutrophils: 69 %
PLATELETS: 214 10*3/uL (ref 150–379)
RBC: 3.76 x10E6/uL — ABNORMAL LOW (ref 3.77–5.28)
RDW: 13.6 % (ref 12.3–15.4)
RPR: NONREACTIVE
RUBELLA: 5.11 {index} (ref >0.99)
Rh Factor: POSITIVE
WBC: 7.6 10*3/uL (ref 3.4–10.8)

## 2016-09-18 LAB — HEMOGLOBINOPATHY EVALUATION
HGB A: 97.6 % (ref 94.0–98.0)
HGB C: 0 %
HGB S: 0 %
Hemoglobin A2 Quantitation: 2.4 % (ref 0.7–3.1)
Hemoglobin F Quantitation: 0 % (ref 0.0–2.0)

## 2016-09-18 LAB — TOXASSURE SELECT 13 (MW), URINE

## 2016-09-18 LAB — CULTURE, OB URINE

## 2016-09-18 LAB — URINE CULTURE, OB REFLEX

## 2016-09-26 ENCOUNTER — Inpatient Hospital Stay (HOSPITAL_COMMUNITY)
Admission: AD | Admit: 2016-09-26 | Discharge: 2016-09-26 | Disposition: A | Discharge status: Home / Self Care | Payer: Medicaid Other | Source: Ambulatory Visit | Attending: Family Medicine | Admitting: Family Medicine

## 2016-09-26 ENCOUNTER — Encounter (HOSPITAL_COMMUNITY): Payer: Self-pay

## 2016-09-26 DIAGNOSIS — Z3A11 11 weeks gestation of pregnancy: Secondary | ICD-10-CM | POA: Insufficient documentation

## 2016-09-26 DIAGNOSIS — B373 Candidiasis of vulva and vagina: Secondary | ICD-10-CM | POA: Diagnosis not present

## 2016-09-26 DIAGNOSIS — Z87891 Personal history of nicotine dependence: Secondary | ICD-10-CM | POA: Diagnosis not present

## 2016-09-26 DIAGNOSIS — O210 Mild hyperemesis gravidarum: Secondary | ICD-10-CM | POA: Diagnosis present

## 2016-09-26 DIAGNOSIS — B3731 Acute candidiasis of vulva and vagina: Secondary | ICD-10-CM

## 2016-09-26 DIAGNOSIS — O98811 Other maternal infectious and parasitic diseases complicating pregnancy, first trimester: Secondary | ICD-10-CM | POA: Insufficient documentation

## 2016-09-26 LAB — URINE MICROSCOPIC-ADD ON

## 2016-09-26 LAB — URINALYSIS, ROUTINE W REFLEX MICROSCOPIC
Glucose, UA: NEGATIVE mg/dL
Hgb urine dipstick: NEGATIVE
Ketones, ur: 40 mg/dL — AB
NITRITE: NEGATIVE
PH: 6 (ref 5.0–8.0)
Protein, ur: 30 mg/dL — AB
SPECIFIC GRAVITY, URINE: 1.025 (ref 1.005–1.030)

## 2016-09-26 MED ORDER — ONDANSETRON 4 MG PO TBDP: 4.0000 mg | ORAL_TABLET | Freq: Three times a day (TID) | ORAL | 0 refills | Status: DC | PRN

## 2016-09-26 MED ORDER — ACETAMINOPHEN 500 MG PO TABS
1000.0000 mg | ORAL_TABLET | Freq: Once | ORAL | Status: AC
  Administered 2016-09-26: 1000 mg via ORAL
  Filled 2016-09-26: qty 2

## 2016-09-26 MED ORDER — DEXTROSE 5 % IN LACTATED RINGERS IV BOLUS
1000.0000 mL | Freq: Once | INTRAVENOUS | Status: AC
  Administered 2016-09-26: 1000 mL via INTRAVENOUS

## 2016-09-26 MED ORDER — PROMETHAZINE HCL 25 MG/ML IJ SOLN
25.0000 mg | Freq: Once | INTRAVENOUS | Status: AC
  Administered 2016-09-26: 25 mg via INTRAVENOUS
  Filled 2016-09-26: qty 1

## 2016-09-26 MED ORDER — ACETAMINOPHEN 500 MG PO TABS: 1000.0000 mg | ORAL_TABLET | Freq: Once | ORAL | Status: DC

## 2016-09-26 MED ORDER — TERCONAZOLE 0.4 % VA CREA: 1.0000 | TOPICAL_CREAM | Freq: Every day | VAGINAL | 0 refills | Status: DC

## 2016-09-26 NOTE — MAU Note (Signed)
Nausea for several days. Nasal congestion. Bad headache. Unable to keep down anything since last night. Has promethazine but unable to keep it down. Leaking clear fld all day. Some abd cramping and lower back pain

## 2016-09-26 NOTE — MAU Note (Addendum)
Pt here for n/v-states that she always has n/v but started getting worse yesterday. Has promethazine rx, took last night but threw it up. Did not take any today. Has a HA-took tylenol last night, but none today. States that she is unable to keep anything down. Denies vag bleeding, but is having some lower abdominal and back pain/cramping. States she started leaking some clear fluid and some vaginal itching-denies odor.

## 2016-09-26 NOTE — Discharge Instructions (Signed)
Eating Plan for Hyperemesis Gravidarum °Hyperemesis gravidarum is a severe form of morning sickness. Because this condition causes severe nausea and vomiting, it can lead to dehydration, malnutrition, and weight loss. One way to lessen the symptoms of nausea and vomiting is to follow the eating plan for hyperemesis gravidarum. It is often used along with prescribed medicines to control your symptoms. °What can I do to relieve my symptoms? °Listen to your body. Everyone is different and has different preferences. Find what works best for you. Take any of the following actions that are helpful to you: °· Eat and drink slowly. °· Eat 5-6 small meals daily instead of 3 large meals. °· Eat crackers before you get out of bed in the morning. °· Try having a snack in the middle of the night. °· Starchy foods are usually tolerated well. Examples include cereal, toast, bread, potatoes, pasta, rice, and pretzels. °· Ginger may help with nausea. Add ¼ tsp ground ginger to hot tea or choose ginger tea. °· Try drinking 100% fruit juice or an electrolyte drink. An electrolyte drink contains sodium, potassium, and chloride. °· Continue to take your prenatal vitamins as told by your health care provider. If you are having trouble taking your prenatal vitamins, talk with your health care provider about different options. °· Include at least 1 serving of protein with your meals and snacks. Protein options include meats or poultry, beans, nuts, eggs, and yogurt. Try eating a protein-rich snack before bed. Examples of these snacks include cheese and crackers or half of a peanut butter or turkey sandwich. °· Consider eliminating foods that trigger your symptoms. These may include spicy foods, coffee, high-fat foods, very sweet foods, and acidic foods. °· Try meals that have more protein combined with bland, salty, lower-fat, and dry foods, such as nuts, seeds, pretzels, crackers, and cereal. °· Talk with your healthcare provider about  starting a supplement of vitamin B6. °· Have fluids that are cold, clear, and carbonated or sour. Examples include lemonade, ginger ale, lemon-lime soda, ice water, and sparkling water. °· Try lemon or mint tea. °· Try brushing your teeth or using a mouth rinse after meals. °What should I avoid to reduce my symptoms? °Avoiding some of the following things may help reduce your symptoms. °· Foods with strong smells. Try eating meals in well-ventilated areas that are free of odors. °· Drinking water or other beverages with meals. Try not to drink anything during the 30 minutes before and after your meals. °· Drinking more than 1 cup of fluid at a time. Sometimes using a straw helps. °· Fried or high-fat foods, such as butter and cream sauces. °· Spicy foods. °· Skipping meals as best as you can. Nausea can be more intense on an empty stomach. If you cannot tolerate food at that time, do not force it. Try sucking on ice chips or other frozen items, and make up for missed calories later. °· Lying down within 2 hours after eating. °· Environmental triggers. These may include smoky rooms, closed spaces, rooms with strong smells, warm or humid places, overly loud and noisy rooms, and rooms with motion or flickering lights. °· Quick and sudden changes in your movement. °This information is not intended to replace advice given to you by your health care provider. Make sure you discuss any questions you have with your health care provider. °Document Released: 08/23/2007 Document Revised: 06/24/2016 Document Reviewed: 05/26/2016 °Elsevier Interactive Patient Education © 2017 Elsevier Inc. °Hyperemesis Gravidarum °Hyperemesis gravidarum is a   severe form of nausea and vomiting that happens during pregnancy. Hyperemesis is worse than morning sickness. It may cause you to have nausea or vomiting all day for many days. It may keep you from eating and drinking enough food and liquids. Hyperemesis usually occurs during the first half  (the first 20 weeks) of pregnancy. It often goes away once a woman is in her second half of pregnancy. However, sometimes hyperemesis continues through an entire pregnancy. °What are the causes? °The cause of this condition is not known. It may be related to changes in chemicals (hormones) in the body during pregnancy, such as the high level of pregnancy hormone (human chorionic gonadotropin) or the increase in the female sex hormone (estrogen). °What are the signs or symptoms? °Symptoms of this condition include: °· Severe nausea and vomiting. °· Nausea that does not go away. °· Vomiting that does not allow you to keep any food down. °· Weight loss. °· Body fluid loss (dehydration). °· Having no desire to eat, or not liking food that you have previously enjoyed. °How is this diagnosed? °This condition may be diagnosed based on: °· A physical exam. °· Your medical history. °· Your symptoms. °· Blood tests. °· Urine tests. °How is this treated? °This condition may be managed with medicine. If medicines to do not help relieve nausea and vomiting, you may need to receive fluids through an IV tube at the hospital. °Follow these instructions at home: °· Take over-the-counter and prescription medicines only as told by your health care provider. °· Avoid iron pills and multivitamins that contain iron for the first 3-4 months of pregnancy. If you take prescription iron pills, do not stop taking them unless your health care provider approves. °· Take the following actions to help prevent nausea and vomiting: °¨ In the morning, before getting out of bed, try eating a couple of dry crackers or a piece of toast. °¨ Avoid foods and smells that upset your stomach. Fatty and spicy foods may make nausea worse. °¨ Eat 5-6 small meals a day. °¨ Do not drink fluids while eating meals. Drink between meals. °¨ Eat or suck on things that have ginger in them. Ginger can help relieve nausea. °¨ Avoid food preparation. The smell of food can  spoil your appetite or trigger nausea. °· Follow instructions from your health care provider about eating or drinking restrictions. °· For snacks, eat high-protein foods, such as cheese. °· Keep all follow-up and pre-birth (prenatal) visits as told by your health care provider. This is important. °Contact a health care provider if: °· You have pain in your abdomen. °· You have a severe headache. °· You have vision problems. °· You are losing weight. °Get help right away if: °· You cannot drink fluids without vomiting. °· You vomit blood. °· You have constant nausea and vomiting. °· You are very weak. °· You are very thirsty. °· You feel dizzy. °· You faint. °· You have a fever or other symptoms that last for more than 2-3 days. °· You have a fever and your symptoms suddenly get worse. °Summary °· Hyperemesis gravidarum is a severe form of nausea and vomiting that happens during pregnancy. °· Making some changes to your eating habits may help relieve nausea and vomiting. °· This condition may be managed with medicine. °· If medicines to do not help relieve nausea and vomiting, you may need to receive fluids through an IV tube at the hospital. °This information is not intended to replace advice   given to you by your health care provider. Make sure you discuss any questions you have with your health care provider. °Document Released: 10/26/2005 Document Revised: 06/24/2016 Document Reviewed: 06/24/2016 °Elsevier Interactive Patient Education © 2017 Elsevier Inc. ° °

## 2016-09-26 NOTE — MAU Provider Note (Signed)
History     CSN: [id number]  Arrival date and time: 09/26/16 1946   None     Chief Complaint  Patient presents with  . Emesis During Pregnancy  . Headache   HPI   Kaitlin Montgomery is a  20 y.o. female G2P0010 @ [id number] here in MAU with nausea/vomiting and HA.  She has had off and on N/V throughout the pregnancy and she feels like it is getting worse. She tried take phenergan PO last night and she vomited.  She has vomited twice this morning, however has had nausea all day long. She vomited 8 times today. She had a bag of chips here while waiting and feels ok after eating that. She reports weight loss.   + clumpy white discharge and clear discharge at times.   OB History    Gravida Para Term Preterm AB Living   2       1 0   SAB TAB Ectopic Multiple Live Births   1              Past Medical History:  Diagnosis Date  . Anxiety 06/10/2015  . Bronchitis   . Bronchitis   . Depressed mood 07/01/2015  . 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 Keloid 02/11/2015  . Seasonal allergies 04/09/2015    Past Surgical History:  Procedure Laterality Date  . EXTERNAL EAR SURGERY Right     Family History  Problem Relation Age of Onset  . Cancer Maternal Grandmother     Social History  Substance Use Topics  . Smoking status: Former Smoker    Types: Cigars    Quit date: 08/25/2016  . Smokeless tobacco: Never Used  . Alcohol use No    Allergies: No Known Allergies  Prescriptions Prior to Admission  Medication Sig Dispense Refill Last Dose  . acetaminophen (TYLENOL) 325 MG tablet Take 325 mg by mouth every 6 (six) hours as needed for moderate pain.   09/25/2016 at Unknown time  . Meclizine HCl (BONINE PO) Take 1 tablet by mouth daily as needed (motion sickness).   Past Month at Unknown time  . Prenatal Vit-Fe Fumarate-FA (PREPLUS) 27-1 MG TABS Take 1 tablet by mouth daily. 30 tablet 13 Past Week at Unknown time  . promethazine (PHENERGAN) 25 MG tablet Take 1 tablet (25 mg total)  by mouth every 6 (six) hours as needed for nausea or vomiting. 30 tablet 2 09/25/2016 at Unknown time   Results for orders placed or performed during the hospital encounter of 09/26/16 (from the past 48 hour(s))  Urinalysis, Routine w reflex microscopic ([facility])     Status: Abnormal   Collection Time: 09/26/16  8:15 PM  Result Value Ref Range   Color, Urine YELLOW YELLOW   APPearance CLEAR CLEAR   Specific Gravity, Urine 1.025 1.005 - 1.030   pH 6.0 5.0 - 8.0   Glucose, UA NEGATIVE NEGATIVE mg/dL   Hgb urine dipstick NEGATIVE NEGATIVE   Bilirubin Urine SMALL (A) NEGATIVE   Ketones, ur 40 (A) NEGATIVE mg/dL   Protein, ur 30 (A) NEGATIVE mg/dL   Nitrite NEGATIVE NEGATIVE   Leukocytes, UA SMALL (A) NEGATIVE  Urine microscopic-add on     Status: Abnormal   Collection Time: 09/26/16  8:15 PM  Result Value Ref Range   Squamous Epithelial / LPF 6-30 (A) NONE SEEN   WBC, UA 0-5 0 - 5 WBC/hpf   RBC / HPF 0-5 0 - 5 RBC/hpf   Bacteria, UA FEW (A) NONE SEEN  Urine-Other YEAST PRESENT     Comment: MUCOUS PRESENT   Review of Systems  Gastrointestinal: Positive for nausea and vomiting. Negative for diarrhea.  Genitourinary: Negative for dysuria.  Neurological: Positive for headaches.   Physical Exam   Blood pressure 111/66, pulse 92, resp. rate 18, height 5\' 7"  (1.702 m), weight 144 lb 6.4 oz (65.5 kg), last menstrual period 07/10/2016.  Physical Exam  Constitutional: She is oriented to person, place, and time. She appears well-developed and well-nourished. No distress.  HENT:  Head: Normocephalic.  Eyes: Pupils are equal, round, and reactive to light.  Respiratory: Effort normal.  Musculoskeletal: Normal range of motion.  Neurological: She is alert and oriented to person, place, and time.  Skin: Skin is warm. She is not diaphoretic.  Psychiatric: Her behavior is normal.    MAU Course  Procedures  None  MDM  UA + fetal heart tones via doppler  D5LR bolus X 1 LR bolus  with 25 mg of phenergan X 1 Patient has had a 9 lb weight loss, will add Zofran to her medication regimen.  Po challenge: pass   Assessment and Plan   A:  1. Hyperemesis of pregnancy   2. Yeast vaginitis     P:  Discharge home in stable condition Rx: Terazol, Zofran Ok to use phenergan vaginally Return to MAU if symptoms worsen  Small, frequent meals   Duane LopeJennifer I Rasch, NP 09/26/2016 11:36 PM

## 2016-10-07 ENCOUNTER — Ambulatory Visit (HOSPITAL_COMMUNITY)
Admission: RE | Admit: 2016-10-07 | Discharge: 2016-10-07 | Disposition: A | Discharge status: Home / Self Care | Payer: Medicaid Other | Source: Ambulatory Visit | Attending: Obstetrics & Gynecology | Admitting: Obstetrics & Gynecology

## 2016-10-07 ENCOUNTER — Encounter (HOSPITAL_COMMUNITY): Payer: Self-pay

## 2016-10-07 DIAGNOSIS — Z3401 Encounter for supervision of normal first pregnancy, first trimester: Secondary | ICD-10-CM | POA: Diagnosis present

## 2016-10-07 DIAGNOSIS — Z3A12 12 weeks gestation of pregnancy: Secondary | ICD-10-CM | POA: Diagnosis not present

## 2016-10-12 ENCOUNTER — Encounter: Payer: Medicaid Other | Admitting: Obstetrics and Gynecology

## 2016-10-15 ENCOUNTER — Other Ambulatory Visit (HOSPITAL_COMMUNITY): Payer: Self-pay

## 2016-10-29 ENCOUNTER — Ambulatory Visit (INDEPENDENT_AMBULATORY_CARE_PROVIDER_SITE_OTHER): Payer: Medicaid Other | Admitting: Obstetrics and Gynecology

## 2016-10-29 ENCOUNTER — Encounter: Payer: Self-pay | Admitting: Obstetrics and Gynecology

## 2016-10-29 VITALS — BP 107/65 | HR 89 | Wt 146.6 lb

## 2016-10-29 DIAGNOSIS — Z3402 Encounter for supervision of normal first pregnancy, second trimester: Secondary | ICD-10-CM

## 2016-10-29 NOTE — Progress Notes (Signed)
Patient wants to know when she can find out the gender

## 2016-10-29 NOTE — Patient Instructions (Signed)
Second Trimester of Pregnancy The second trimester is from week 13 through week 28 (months 4 through 6). The second trimester is often a time when you feel your best. Your body has also adjusted to being pregnant, and you begin to feel better physically. Usually, morning sickness has lessened or quit completely, you may have more energy, and you may have an increase in appetite. The second trimester is also a time when the fetus is growing rapidly. At the end of the sixth month, the fetus is about 9 inches long and weighs about 1 pounds. You will likely begin to feel the baby move (quickening) between 18 and 20 weeks of the pregnancy. Body changes during your second trimester Your body continues to go through many changes during your second trimester. The changes vary from woman to woman.  Your weight will continue to increase. You will notice your lower abdomen bulging out.  You may begin to get stretch marks on your hips, abdomen, and breasts.  You may develop headaches that can be relieved by medicines. The medicines should be approved by your health care provider.  You may urinate more often because the fetus is pressing on your bladder.  You may develop or continue to have heartburn as a result of your pregnancy.  You may develop constipation because certain hormones are causing the muscles that push waste through your intestines to slow down.  You may develop hemorrhoids or swollen, bulging veins (varicose veins).  You may have back pain. This is caused by:  Weight gain.  Pregnancy hormones that are relaxing the joints in your pelvis.  A shift in weight and the muscles that support your balance.  Your breasts will continue to grow and they will continue to become tender.  Your gums may bleed and may be sensitive to brushing and flossing.  Dark spots or blotches (chloasma, mask of pregnancy) may develop on your face. This will likely fade after the baby is born.  A dark line  from your belly button to the pubic area (linea nigra) may appear. This will likely fade after the baby is born.  You may have changes in your hair. These can include thickening of your hair, rapid growth, and changes in texture. Some women also have hair loss during or after pregnancy, or hair that feels dry or thin. Your hair will most likely return to normal after your baby is born. What to expect at prenatal visits During a routine prenatal visit:  You will be weighed to make sure you and the fetus are growing normally.  Your blood pressure will be taken.  Your abdomen will be measured to track your baby's growth.  The fetal heartbeat will be listened to.  Any test results from the previous visit will be discussed. Your health care provider may ask you:  How you are feeling.  If you are feeling the baby move.  If you have had any abnormal symptoms, such as leaking fluid, bleeding, severe headaches, or abdominal cramping.  If you are using any tobacco products, including cigarettes, chewing tobacco, and electronic cigarettes.  If you have any questions. Other tests that may be performed during your second trimester include:  Blood tests that check for:  Low iron levels (anemia).  Gestational diabetes (between 24 and 28 weeks).  Rh antibodies. This is to check for a protein on red blood cells (Rh factor).  Urine tests to check for infections, diabetes, or protein in the urine.  An ultrasound to   confirm the proper growth and development of the baby.  An amniocentesis to check for possible genetic problems.  Fetal screens for spina bifida and Down syndrome.  HIV (human immunodeficiency virus) testing. Routine prenatal testing includes screening for HIV, unless you choose not to have this test. Follow these instructions at home: Eating and drinking  Continue to eat regular, healthy meals.  Avoid raw meat, uncooked cheese, cat litter boxes, and soil used by cats. These  carry germs that can cause birth defects in the baby.  Take your prenatal vitamins.  Take 1500-2000 mg of calcium daily starting at the 20th week of pregnancy until you deliver your baby.  If you develop constipation:  Take over-the-counter or prescription medicines.  Drink enough fluid to keep your urine clear or pale yellow.  Eat foods that are high in fiber, such as fresh fruits and vegetables, whole grains, and beans.  Limit foods that are high in fat and processed sugars, such as fried and sweet foods. Activity  Exercise only as directed by your health care provider. Experiencing uterine cramps is a good sign to stop exercising.  Avoid heavy lifting, wear low heel shoes, and practice good posture.  Wear your seat belt at all times when driving.  Rest with your legs elevated if you have leg cramps or low back pain.  Wear a good support bra for breast tenderness.  Do not use hot tubs, steam rooms, or saunas. Lifestyle  Avoid all smoking, herbs, alcohol, and unprescribed drugs. These chemicals affect the formation and growth of the baby.  Do not use any products that contain nicotine or tobacco, such as cigarettes and e-cigarettes. If you need help quitting, ask your health care provider.  A sexual relationship may be continued unless your health care provider directs you otherwise. General instructions  Follow your health care provider's instructions regarding medicine use. There are medicines that are either safe or unsafe to take during pregnancy.  Take warm sitz baths to soothe any pain or discomfort caused by hemorrhoids. Use hemorrhoid cream if your health care provider approves.  If you develop varicose veins, wear support hose. Elevate your feet for 15 minutes, 3-4 times a day. Limit salt in your diet.  Visit your dentist if you have not gone yet during your pregnancy. Use a soft toothbrush to brush your teeth and be gentle when you floss.  Keep all follow-up  prenatal visits as told by your health care provider. This is important. Contact a health care provider if:  You have dizziness.  You have mild pelvic cramps, pelvic pressure, or nagging pain in the abdominal area.  You have persistent nausea, vomiting, or diarrhea.  You have a bad smelling vaginal discharge.  You have pain with urination. Get help right away if:  You have a fever.  You are leaking fluid from your vagina.  You have spotting or bleeding from your vagina.  You have severe abdominal cramping or pain.  You have rapid weight gain or weight loss.  You have shortness of breath with chest pain.  You notice sudden or extreme swelling of your face, hands, ankles, feet, or legs.  You have not felt your baby move in over an hour.  You have severe headaches that do not go away with medicine.  You have vision changes. Summary  The second trimester is from week 13 through week 28 (months 4 through 6). It is also a time when the fetus is growing rapidly.  Your body goes   through many changes during pregnancy. The changes vary from woman to woman.  Avoid all smoking, herbs, alcohol, and unprescribed drugs. These chemicals affect the formation and growth your baby.  Do not use any tobacco products, such as cigarettes, chewing tobacco, and e-cigarettes. If you need help quitting, ask your health care provider.  Contact your health care provider if you have any questions. Keep all prenatal visits as told by your health care provider. This is important. This information is not intended to replace advice given to you by your health care provider. Make sure you discuss any questions you have with your health care provider. Document Released: 10/20/2001 Document Revised: 04/02/2016 Document Reviewed: 12/27/2012 Elsevier Interactive Patient Education  2017 [employer]  

## 2016-10-29 NOTE — Progress Notes (Signed)
Subjective:  Kaitlin Montgomery is a 20 y.o. G1P0000 at 5116w6d being seen today for ongoing prenatal care.  She is currently monitored for the following issues for this low-risk pregnancy and has Anxiety; Depressed mood; and Supervision of normal first teen pregnancy on her problem list.  Patient reports no complaints.  Contractions: Not present. Vag. Bleeding: None.   . Denies leaking of fluid.   The following portions of the patient's history were reviewed and updated as appropriate: allergies, current medications, past family history, past medical history, past social history, past surgical history and problem list. Problem list updated.  Objective:   Vitals:   10/29/16 1052  BP: 107/65  Pulse: 89  Weight: 146 lb 9.6 oz (66.5 kg)    Fetal Status: Fetal Heart Rate (bpm): 160         General:  Alert, oriented and cooperative. Patient is in no acute distress.  Skin: Skin is warm and dry. No rash noted.   Cardiovascular: Normal heart rate noted  Respiratory: Normal respiratory effort, no problems with respiration noted  Abdomen: Soft, gravid, appropriate for gestational age. Pain/Pressure: Absent     Pelvic:  Cervical exam deferred        Extremities: Normal range of motion.  Edema: None  Mental Status: Normal mood and affect. Normal behavior. Normal judgment and thought content.   Urinalysis:      Assessment and Plan:  Pregnancy: G1P0000 at 4816w6d  1. Supervision of normal first teen pregnancy in second trimester Declines AFP and Flu vaccine N/V improving - US MFM OB COMP + 14 WK; Future  Preterm labor symptoms and general obstetric precautions including but not limited to vaginal bleeding, contractions, leaking of fluid and fetal movement were reviewed in detail with the patient. Please refer to After Visit Summary for other counseling recommendations.  Return in about 4 weeks (around 11/26/2016) for OB visit.   Hermina StaggersMichael L Shrika Milos, MD

## 2016-11-09 NOTE — L&D Delivery Note (Addendum)
21 y.o. G1P0000 at 7510w5d delivered a viable female infant in cephalic, LOP position. No nuchal cord. Right anterior shoulder delivered with ease. 60 sec delayed cord clamping. Cord clamped x2 and cut. Placenta delivered spontaneously intact, with 3VC. Fundus firm on exam with massage and pitocin. Good hemostasis noted.  Anesthesia: Epidural; Local anesthesia for repair Laceration: 1st degree Suture: 3-0 Vicryl Good hemostasis noted. EBL: 100cc  Mom and baby recovering in LDR.    Apgars: APGAR (1 MIN): 9   APGAR (5 MINS): 9     Weight: Pending skin to skin  Sponge and instrument count were correct x2. Placenta sent to L&D.  Jeanann LewandowskyBethany Eaton, PA-student, present and assisted with delivery.  Jen MowElizabeth Kealan Buchan, DO OB Fellow [facility] 04/21/2017, 7:34 AM

## 2016-11-13 ENCOUNTER — Ambulatory Visit (HOSPITAL_COMMUNITY): Payer: Medicaid Other | Attending: Obstetrics and Gynecology

## 2016-11-23 ENCOUNTER — Ambulatory Visit (HOSPITAL_COMMUNITY)
Admission: RE | Admit: 2016-11-23 | Discharge: 2016-11-23 | Disposition: A | Discharge status: Home / Self Care | Payer: Medicaid Other | Source: Ambulatory Visit | Attending: Obstetrics and Gynecology | Admitting: Obstetrics and Gynecology

## 2016-11-23 ENCOUNTER — Other Ambulatory Visit: Payer: Self-pay | Admitting: Obstetrics and Gynecology

## 2016-11-23 DIAGNOSIS — Z369 Encounter for antenatal screening, unspecified: Secondary | ICD-10-CM

## 2016-11-23 DIAGNOSIS — Z3689 Encounter for other specified antenatal screening: Secondary | ICD-10-CM | POA: Diagnosis not present

## 2016-11-23 DIAGNOSIS — Z3402 Encounter for supervision of normal first pregnancy, second trimester: Secondary | ICD-10-CM

## 2016-11-23 DIAGNOSIS — Z3A19 19 weeks gestation of pregnancy: Secondary | ICD-10-CM | POA: Insufficient documentation

## 2016-11-24 ENCOUNTER — Other Ambulatory Visit: Payer: Self-pay | Admitting: *Deleted

## 2016-11-24 DIAGNOSIS — Z3492 Encounter for supervision of normal pregnancy, unspecified, second trimester: Secondary | ICD-10-CM

## 2016-11-24 NOTE — Progress Notes (Signed)
U/s ordered per Dr Alysia PennaErvin.

## 2016-11-26 ENCOUNTER — Encounter: Payer: Medicaid Other | Admitting: Family Medicine

## 2016-12-07 ENCOUNTER — Encounter: Payer: Medicaid Other | Admitting: Obstetrics and Gynecology

## 2016-12-08 ENCOUNTER — Encounter: Payer: Medicaid Other | Admitting: Obstetrics & Gynecology

## 2016-12-17 ENCOUNTER — Ambulatory Visit (INDEPENDENT_AMBULATORY_CARE_PROVIDER_SITE_OTHER): Payer: Medicaid Other | Admitting: Certified Nurse Midwife

## 2016-12-17 ENCOUNTER — Other Ambulatory Visit (HOSPITAL_COMMUNITY)
Admission: RE | Admit: 2016-12-17 | Discharge: 2016-12-17 | Disposition: A | Discharge status: Home / Self Care | Payer: Medicaid Other | Source: Ambulatory Visit | Attending: Certified Nurse Midwife | Admitting: Certified Nurse Midwife

## 2016-12-17 VITALS — BP 102/69 | HR 89 | Wt 151.0 lb

## 2016-12-17 DIAGNOSIS — Z3A22 22 weeks gestation of pregnancy: Secondary | ICD-10-CM | POA: Diagnosis not present

## 2016-12-17 DIAGNOSIS — N898 Other specified noninflammatory disorders of vagina: Secondary | ICD-10-CM | POA: Insufficient documentation

## 2016-12-17 DIAGNOSIS — O26892 Other specified pregnancy related conditions, second trimester: Secondary | ICD-10-CM | POA: Diagnosis not present

## 2016-12-17 DIAGNOSIS — Z3402 Encounter for supervision of normal first pregnancy, second trimester: Secondary | ICD-10-CM

## 2016-12-17 NOTE — Progress Notes (Signed)
Pt states change in d/c, would like check today.

## 2016-12-17 NOTE — Progress Notes (Signed)
   PRENATAL VISIT NOTE  Subjective:  Kaitlin Montgomery is a 21 y.o. G1P0000 at 1575w6d being seen today for ongoing prenatal care.  She is currently monitored for the following issues for this low-risk pregnancy and has Anxiety; Depressed mood; and Supervision of normal first teen pregnancy on her problem list.  Patient reports no complaints.  Contractions: Not present. Vag. Bleeding: None.  Movement: Present. Denies leaking of fluid.   The following portions of the patient's history were reviewed and updated as appropriate: allergies, current medications, past family history, past medical history, past social history, past surgical history and problem list. Problem list updated.  Objective:   Vitals:   12/17/16 1550  BP: 102/69  Pulse: 89  Weight: 151 lb (68.5 kg)    Fetal Status: Fetal Heart Rate (bpm): 138 Fundal Height: 22 cm Movement: Present     General:  Alert, oriented and cooperative. Patient is in no acute distress.  Skin: Skin is warm and dry. No rash noted.   Cardiovascular: Normal heart rate noted  Respiratory: Normal respiratory effort, no problems with respiration noted  Abdomen: Soft, gravid, appropriate for gestational age. Pain/Pressure: Present     Pelvic:  Cervical exam deferred        Extremities: Normal range of motion.     Mental Status: Normal mood and affect. Normal behavior. Normal judgment and thought content.   Assessment and Plan:  Pregnancy: G1P0000 at 6975w6d  1. Supervision of normal first teen pregnancy in second trimester      Doing well.   2. Vaginal discharge during pregnancy in second trimester  - Cervicovaginal ancillary only  Preterm labor symptoms and general obstetric precautions including but not limited to vaginal bleeding, contractions, leaking of fluid and fetal movement were reviewed in detail with the patient. Please refer to After Visit Summary for other counseling recommendations.  Return in about 4 weeks (around 01/14/2017) for  ROB.   Roe Coombsachelle A Carless Slatten, CNM

## 2016-12-17 NOTE — Patient Instructions (Signed)
AREA PEDIATRIC/FAMILY PRACTICE PHYSICIANS  [facility] [address], [address] [place], [place]  [address] Phone - [phone]   Fax - [phone]  [facility] [address] [place], [place] [address] Phone - [phone]   Fax - [phone]  [facility] [address] [place], [place]  [address] Phone - [phone]   Fax - [phone]  [facility] [address] [place], [place]  [address] Phone - [phone]   Fax - [phone]  [facility] [address] [place], [place]  [address] Phone - [phone]   Fax - [phone]  [facility] [address], [address] [place], [place]  [address] Phone - [phone]   Fax - [phone]  [facility] [address] [place], [place]  [address] Phone - [phone]   Fax - [phone]  [facility] [address], [address] [place], [place]  [address] Phone - [phone]   Fax - [phone]  [facility] [address] [place], [place]  [address] Phone - [phone]   Fax - [phone] [facility] [address] [place], [place]  [address] Phone - [phone]   Fax - [phone]  [facility] [address] [place], [place]  [address] Phone - [phone]   Fax - [phone]  [facility] [address] [place], [place]  [address] Phone - [phone]   Fax - [phone]  [facility] [address] [place], [place]  [address] Phone - [phone]   Fax - [phone]  [facility] [address] [place], [place]  [address] Phone - [phone]  [facility] [address] [place], [place]  [address] Phone - [phone]   Fax - [phone]  [facility] [address] [place], [place]  [address] Phone - [phone]   Fax - [phone]  [facility] [address] [place], [place]  [address] Phone - [phone]   Fax - [phone]  [facility] [address] [place], [place]  [address] Phone - [phone]   Fax - [phone]   [facility] [address] [place], [place]  [address] Phone - [phone]   Fax - [phone]  [facility] [address] [place], [place]  [address] Phone - [phone]   Fax - [phone]  [facility] [address] [place], [place]  [address] Phone - [phone]   Fax - [phone]  [facility] [address] [place], [place]  [address] Phone - [phone]   Fax - [phone]  [facility] [address] , [place]  [address] Phone - [phone]   Fax - [phone] [facility] [address] [place], [place]  [address] Phone - [phone]   Fax - [phone]  [facility] [address] [place], [place]  [address] Phone - [phone]   Fax - [phone]  [facility] [address] ,   [address] Phone - [phone]   Fax - [phone]   

## 2016-12-18 LAB — CERVICOVAGINAL ANCILLARY ONLY
Bacterial vaginitis: NEGATIVE
CHLAMYDIA, DNA PROBE: NEGATIVE
Candida vaginitis: POSITIVE — AB
Neisseria Gonorrhea: NEGATIVE
Trichomonas: NEGATIVE

## 2016-12-21 ENCOUNTER — Other Ambulatory Visit: Payer: Self-pay | Admitting: Certified Nurse Midwife

## 2016-12-21 ENCOUNTER — Ambulatory Visit (HOSPITAL_COMMUNITY)
Admission: RE | Admit: 2016-12-21 | Discharge: 2016-12-21 | Disposition: A | Discharge status: Home / Self Care | Payer: Medicaid Other | Source: Ambulatory Visit | Attending: Obstetrics and Gynecology | Admitting: Obstetrics and Gynecology

## 2016-12-21 DIAGNOSIS — Z362 Encounter for other antenatal screening follow-up: Secondary | ICD-10-CM | POA: Diagnosis not present

## 2016-12-21 DIAGNOSIS — Z3A23 23 weeks gestation of pregnancy: Secondary | ICD-10-CM | POA: Diagnosis not present

## 2016-12-21 DIAGNOSIS — Z3492 Encounter for supervision of normal pregnancy, unspecified, second trimester: Secondary | ICD-10-CM

## 2016-12-21 DIAGNOSIS — B3731 Acute candidiasis of vulva and vagina: Secondary | ICD-10-CM

## 2016-12-21 DIAGNOSIS — B373 Candidiasis of vulva and vagina: Secondary | ICD-10-CM

## 2016-12-21 MED ORDER — FLUCONAZOLE 150 MG PO TABS: 150.0000 mg | ORAL_TABLET | Freq: Once | ORAL | 0 refills | Status: AC

## 2016-12-21 MED ORDER — TERCONAZOLE 0.8 % VA CREA: 1.0000 | TOPICAL_CREAM | Freq: Every day | VAGINAL | 0 refills | Status: DC

## 2016-12-29 ENCOUNTER — Telehealth: Payer: Self-pay

## 2016-12-29 ENCOUNTER — Observation Stay (HOSPITAL_COMMUNITY)
Admission: AD | Admit: 2016-12-29 | Discharge: 2016-12-31 | Disposition: A | Discharge status: Home / Self Care | Payer: Medicaid Other | Source: Ambulatory Visit | Attending: Family Medicine | Admitting: Family Medicine

## 2016-12-29 ENCOUNTER — Inpatient Hospital Stay (HOSPITAL_COMMUNITY): Payer: Medicaid Other

## 2016-12-29 ENCOUNTER — Encounter (HOSPITAL_COMMUNITY): Payer: Self-pay | Admitting: *Deleted

## 2016-12-29 DIAGNOSIS — Z87891 Personal history of nicotine dependence: Secondary | ICD-10-CM | POA: Insufficient documentation

## 2016-12-29 DIAGNOSIS — K824 Cholesterolosis of gallbladder: Secondary | ICD-10-CM | POA: Diagnosis present

## 2016-12-29 DIAGNOSIS — O26899 Other specified pregnancy related conditions, unspecified trimester: Secondary | ICD-10-CM

## 2016-12-29 DIAGNOSIS — Z3A24 24 weeks gestation of pregnancy: Secondary | ICD-10-CM | POA: Diagnosis not present

## 2016-12-29 DIAGNOSIS — R109 Unspecified abdominal pain: Secondary | ICD-10-CM

## 2016-12-29 DIAGNOSIS — J45909 Unspecified asthma, uncomplicated: Secondary | ICD-10-CM | POA: Diagnosis not present

## 2016-12-29 DIAGNOSIS — R112 Nausea with vomiting, unspecified: Secondary | ICD-10-CM | POA: Diagnosis present

## 2016-12-29 DIAGNOSIS — O212 Late vomiting of pregnancy: Secondary | ICD-10-CM | POA: Insufficient documentation

## 2016-12-29 DIAGNOSIS — R748 Abnormal levels of other serum enzymes: Secondary | ICD-10-CM | POA: Diagnosis not present

## 2016-12-29 DIAGNOSIS — O99612 Diseases of the digestive system complicating pregnancy, second trimester: Principal | ICD-10-CM | POA: Insufficient documentation

## 2016-12-29 DIAGNOSIS — O26892 Other specified pregnancy related conditions, second trimester: Secondary | ICD-10-CM | POA: Diagnosis present

## 2016-12-29 LAB — URINALYSIS, ROUTINE W REFLEX MICROSCOPIC
Bilirubin Urine: NEGATIVE
GLUCOSE, UA: NEGATIVE mg/dL
HGB URINE DIPSTICK: NEGATIVE
KETONES UR: NEGATIVE mg/dL
LEUKOCYTES UA: NEGATIVE
Nitrite: NEGATIVE
PROTEIN: NEGATIVE mg/dL
Specific Gravity, Urine: 1.018 (ref 1.005–1.030)
pH: 6 (ref 5.0–8.0)

## 2016-12-29 LAB — COMPREHENSIVE METABOLIC PANEL
ALK PHOS: 41 U/L (ref 38–126)
ALT: 7 U/L — AB (ref 14–54)
AST: 20 U/L (ref 15–41)
Albumin: 3.6 g/dL (ref 3.5–5.0)
Anion gap: 8 (ref 5–15)
BILIRUBIN TOTAL: 0.3 mg/dL (ref 0.3–1.2)
BUN: <5 mg/dL — ABNORMAL LOW (ref 6–20)
CALCIUM: 9 mg/dL (ref 8.9–10.3)
CO2: 22 mmol/L (ref 22–32)
CREATININE: 0.42 mg/dL — AB (ref 0.44–1.00)
Chloride: 105 mmol/L (ref 101–111)
Glucose, Bld: 82 mg/dL (ref 65–99)
Potassium: 3.3 mmol/L — ABNORMAL LOW (ref 3.5–5.1)
Sodium: 135 mmol/L (ref 135–145)
TOTAL PROTEIN: 7 g/dL (ref 6.5–8.1)

## 2016-12-29 LAB — CBC WITH DIFFERENTIAL/PLATELET
BASOS ABS: 0 10*3/uL (ref 0.0–0.1)
BASOS PCT: 0 %
EOS PCT: 2 %
Eosinophils Absolute: 0.1 10*3/uL (ref 0.0–0.7)
HEMATOCRIT: 25.4 % — AB (ref 36.0–46.0)
HEMOGLOBIN: 9.2 g/dL — AB (ref 12.0–15.0)
Lymphocytes Relative: 30 %
Lymphs Abs: 2.2 10*3/uL (ref 0.7–4.0)
MCH: 31 pg (ref 26.0–34.0)
MCHC: 36.2 g/dL — AB (ref 30.0–36.0)
MCV: 85.5 fL (ref 78.0–100.0)
MONO ABS: 0 10*3/uL — AB (ref 0.1–1.0)
Monocytes Relative: 0 %
Neutro Abs: 5 10*3/uL (ref 1.7–7.7)
Neutrophils Relative %: 68 %
Platelets: 160 10*3/uL (ref 150–400)
RBC: 2.97 MIL/uL — AB (ref 3.87–5.11)
RDW: 12.7 % (ref 11.5–15.5)
WBC: 7.3 10*3/uL (ref 4.0–10.5)

## 2016-12-29 LAB — TYPE AND SCREEN
ABO/RH(D): O POS
Antibody Screen: NEGATIVE

## 2016-12-29 LAB — AMYLASE: AMYLASE: 117 U/L — AB (ref 28–100)

## 2016-12-29 LAB — LIPASE, BLOOD: Lipase: 20 U/L (ref 11–51)

## 2016-12-29 MED ORDER — DOCUSATE SODIUM 100 MG PO CAPS
100.0000 mg | ORAL_CAPSULE | Freq: Every day | ORAL | Status: DC
  Administered 2016-12-31: 100 mg via ORAL
  Filled 2016-12-29: qty 1

## 2016-12-29 MED ORDER — CALCIUM CARBONATE ANTACID 500 MG PO CHEW: 2.0000 | CHEWABLE_TABLET | ORAL | Status: DC | PRN

## 2016-12-29 MED ORDER — ONDANSETRON 8 MG PO TBDP
8.0000 mg | ORAL_TABLET | Freq: Once | ORAL | Status: AC
  Administered 2016-12-29: 8 mg via ORAL
  Filled 2016-12-29: qty 1

## 2016-12-29 MED ORDER — PROMETHAZINE HCL 25 MG/ML IJ SOLN
12.5000 mg | INTRAMUSCULAR | Status: DC | PRN
  Administered 2016-12-30: 12.5 mg via INTRAVENOUS
  Filled 2016-12-29: qty 1

## 2016-12-29 MED ORDER — LACTATED RINGERS IV SOLN
25.0000 mg | Freq: Once | INTRAVENOUS | Status: AC
  Administered 2016-12-29: 25 mg via INTRAVENOUS
  Filled 2016-12-29: qty 1

## 2016-12-29 MED ORDER — ONDANSETRON HCL 4 MG/2ML IJ SOLN: 4.0000 mg | Freq: Once | INTRAMUSCULAR | Status: DC

## 2016-12-29 MED ORDER — ZOLPIDEM TARTRATE 5 MG PO TABS: 5.0000 mg | ORAL_TABLET | Freq: Every evening | ORAL | Status: DC | PRN

## 2016-12-29 MED ORDER — LACTATED RINGERS IV BOLUS (SEPSIS)
1000.0000 mL | Freq: Once | INTRAVENOUS | Status: AC
  Administered 2016-12-29: 1000 mL via INTRAVENOUS

## 2016-12-29 MED ORDER — KETOROLAC TROMETHAMINE 30 MG/ML IJ SOLN
30.0000 mg | Freq: Once | INTRAMUSCULAR | Status: AC
  Administered 2016-12-29: 30 mg via INTRAVENOUS
  Filled 2016-12-29: qty 1

## 2016-12-29 MED ORDER — ONDANSETRON HCL 4 MG/2ML IJ SOLN
4.0000 mg | Freq: Once | INTRAMUSCULAR | Status: AC
  Administered 2016-12-29: 4 mg via INTRAVENOUS
  Filled 2016-12-29: qty 2

## 2016-12-29 MED ORDER — PRENATAL MULTIVITAMIN CH
1.0000 | ORAL_TABLET | Freq: Every day | ORAL | Status: DC
  Administered 2016-12-31: 1 via ORAL
  Filled 2016-12-29: qty 1

## 2016-12-29 MED ORDER — LACTATED RINGERS IV SOLN
INTRAVENOUS | Status: DC
  Administered 2016-12-30: 16:00:00 via INTRAVENOUS

## 2016-12-29 MED ORDER — ACETAMINOPHEN 325 MG PO TABS: 650.0000 mg | ORAL_TABLET | ORAL | Status: DC | PRN

## 2016-12-29 MED ORDER — HYDROMORPHONE HCL 1 MG/ML IJ SOLN
1.0000 mg | INTRAMUSCULAR | Status: DC | PRN
  Administered 2016-12-29 – 2016-12-30 (×9): 1 mg via INTRAVENOUS
  Filled 2016-12-29 (×9): qty 1

## 2016-12-29 MED ORDER — OXYCODONE-ACETAMINOPHEN 5-325 MG PO TABS
2.0000 | ORAL_TABLET | Freq: Once | ORAL | Status: AC
  Administered 2016-12-29: 2 via ORAL
  Filled 2016-12-29: qty 2

## 2016-12-29 MED ORDER — HYDROMORPHONE HCL 1 MG/ML IJ SOLN
1.0000 mg | Freq: Once | INTRAMUSCULAR | Status: AC
  Administered 2016-12-29: 1 mg via INTRAMUSCULAR
  Filled 2016-12-29: qty 1

## 2016-12-29 MED ORDER — PANTOPRAZOLE SODIUM 40 MG IV SOLR
40.0000 mg | Freq: Once | INTRAVENOUS | Status: AC
  Administered 2016-12-29: 40 mg via INTRAVENOUS
  Filled 2016-12-29: qty 40

## 2016-12-29 NOTE — MAU Note (Signed)
Pain in upper abd, started about 30 min ago, is really intense and getting worse.  Never had anything like this before.  Was eating when it started

## 2016-12-29 NOTE — MAU Provider Note (Signed)
Chief Complaint:  Abdominal Pain   First Provider Initiated Contact with Patient 12/29/16 1458     HPI: Kaitlin Montgomery is a 20 y.o. G1P0000 at [redacted]w[redacted]d who presents to maternity admissions reporting w/ sudden onset of severe epigastric pain after eating bacon eggs and waffles for lunch. No Hx similar pain.   Location: Upper mid abd Quality: sharp Severity: 10/10 in pain scale Duration: Since 1330 Context: after eating Timing: Constant Modifying factors: Hasn't tried anything for the pain. Unable to lie back 2/2 pain.  Associated signs and symptoms: Initially neg for N/V, but vomited after Dilaudid in MAU. Neg for fever, chills,   Denies contractions, leakage of fluid or vaginal bleeding. Good fetal movement.   Past Medical History:  Diagnosis Date  . Anxiety 06/10/2015  . Bronchitis   . Bronchitis   . Depressed mood 07/01/2015  . 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 Keloid 02/11/2015  . Seasonal allergies 04/09/2015   OB History  Gravida Para Term Preterm AB Living  1       0 0  SAB TAB Ectopic Multiple Live Births  0            # Outcome Date GA Lbr Len/2nd Weight Sex Delivery Anes PTL Lv  1 Current              Past Surgical History:  Procedure Laterality Date  . EXTERNAL EAR SURGERY Right    Family History  Problem Relation Age of Onset  . Cancer Maternal Grandmother    Social History  Substance Use Topics  . Smoking status: Former Smoker    Types: Cigars    Quit date: 08/25/2016  . Smokeless tobacco: Never Used  . Alcohol use No   No Known Allergies Prescriptions Prior to Admission  Medication Sig Dispense Refill Last Dose  . Prenatal Vit-Fe Fumarate-FA (PREPLUS) 27-1 MG TABS Take 1 tablet by mouth daily. 30 tablet 13 Past Week at Unknown time  . ondansetron (ZOFRAN ODT) 4 MG disintegrating tablet Take 1 tablet (4 mg total) by mouth every 8 (eight) hours as needed for nausea or vomiting. (Patient not taking: Reported on 10/29/2016) 20 tablet 0 Not Taking at  Unknown time  . promethazine (PHENERGAN) 25 MG tablet Take 1 tablet (25 mg total) by mouth every 6 (six) hours as needed for nausea or vomiting. (Patient not taking: Reported on 10/29/2016) 30 tablet 2 Not Taking at Unknown time  . terconazole (TERAZOL 3) 0.8 % vaginal cream Place 1 applicator vaginally at bedtime. (Patient not taking: Reported on 12/29/2016) 20 g 0 Not Taking at Unknown time    I have reviewed patient's Past Medical Hx, Surgical Hx, Family Hx, Social Hx, medications and allergies.   ROS:  Review of Systems  Constitutional: Negative for appetite change, chills and fever.  Gastrointestinal: Positive for abdominal pain, nausea (Since arrival to MAU) and vomiting (since arrival to MAU). Negative for abdominal distention, constipation and diarrhea.  Genitourinary: Negative for dysuria, hematuria and vaginal bleeding.  Musculoskeletal: Positive for back pain (pain radiating through mid back).    Physical Exam  Patient Vitals for the past 24 hrs:  BP Temp Temp src Pulse Resp Weight  12/29/16 1743 110/63 97.8 F (36.6 C) Oral 82 16 -  12/29/16 1421 94/58 99.2 F (37.3 C) Oral 96 20 149 lb 12.8 oz (67.9 kg)   Constitutional: Well-developed, well-nourished female in severe distress.  Cardiovascular: normal rate Respiratory: normal effort GI: Abd soft, moderate, gravid appropriate for gestational age. Pos BS   x 4 Neurologic: Alert and oriented x 4.  GU: Neg CVAT.  Pelvic: deferred    FHT:  Baseline 140 , moderate variability, 10x10 accelerations present, no decelerations. Very limited tracing initially because pt unable to sit back for monitoring. Monitor held in place by CNM.  Contractions: None   Labs: Results for orders placed or performed during the hospital encounter of 12/29/16 (from the past 24 hour(s))  Urinalysis, Routine w reflex microscopic     Status: Abnormal   Collection Time: 12/29/16  2:30 PM  Result Value Ref Range   Color, Urine YELLOW YELLOW    APPearance HAZY (A) CLEAR   Specific Gravity, Urine 1.018 1.005 - 1.030   pH 6.0 5.0 - 8.0   Glucose, UA NEGATIVE NEGATIVE mg/dL   Hgb urine dipstick NEGATIVE NEGATIVE   Bilirubin Urine NEGATIVE NEGATIVE   Ketones, ur NEGATIVE NEGATIVE mg/dL   Protein, ur NEGATIVE NEGATIVE mg/dL   Nitrite NEGATIVE NEGATIVE   Leukocytes, UA NEGATIVE NEGATIVE  CBC with Differential/Platelet     Status: Abnormal   Collection Time: 12/29/16  2:34 PM  Result Value Ref Range   WBC 7.3 4.0 - 10.5 K/uL   RBC 2.97 (L) 3.87 - 5.11 MIL/uL   Hemoglobin 9.2 (L) 12.0 - 15.0 g/dL   HCT 25.4 (L) 36.0 - 46.0 %   MCV 85.5 78.0 - 100.0 fL   MCH 31.0 26.0 - 34.0 pg   MCHC 36.2 (H) 30.0 - 36.0 g/dL   RDW 12.7 11.5 - 15.5 %   Platelets 160 150 - 400 K/uL   Neutrophils Relative % 68 %   Neutro Abs 5.0 1.7 - 7.7 K/uL   Lymphocytes Relative 30 %   Lymphs Abs 2.2 0.7 - 4.0 K/uL   Monocytes Relative 0 %   Monocytes Absolute 0.0 (L) 0.1 - 1.0 K/uL   Eosinophils Relative 2 %   Eosinophils Absolute 0.1 0.0 - 0.7 K/uL   Basophils Relative 0 %   Basophils Absolute 0.0 0.0 - 0.1 K/uL  Comprehensive metabolic panel     Status: Abnormal   Collection Time: 12/29/16  2:34 PM  Result Value Ref Range   Sodium 135 135 - 145 mmol/L   Potassium 3.3 (L) 3.5 - 5.1 mmol/L   Chloride 105 101 - 111 mmol/L   CO2 22 22 - 32 mmol/L   Glucose, Bld 82 65 - 99 mg/dL   BUN <5 (L) 6 - 20 mg/dL   Creatinine, Ser 0.42 (L) 0.44 - 1.00 mg/dL   Calcium 9.0 8.9 - 10.3 mg/dL   Total Protein 7.0 6.5 - 8.1 g/dL   Albumin 3.6 3.5 - 5.0 g/dL   AST 20 15 - 41 U/L   ALT 7 (L) 14 - 54 U/L   Alkaline Phosphatase 41 38 - 126 U/L   Total Bilirubin 0.3 0.3 - 1.2 mg/dL   GFR calc non Af Amer >60 >60 mL/min   GFR calc Af Amer >60 >60 mL/min   Anion gap 8 5 - 15  Lipase, blood     Status: None   Collection Time: 12/29/16  2:34 PM  Result Value Ref Range   Lipase 20 11 - 51 U/L  Amylase     Status: Abnormal   Collection Time: 12/29/16  2:34 PM  Result  Value Ref Range   Amylase 117 (H) 28 - 100 U/L    Imaging:  Us Abdomen Complete  Result Date: 12/29/2016 CLINICAL DATA:  Right upper quadrant pain after eating 3   hours ago. Twenty-four weeks pregnant. EXAM: ABDOMEN ULTRASOUND COMPLETE COMPARISON:  None. FINDINGS: Gallbladder: 4 mm polyp. No shadowing stones. No wall thickening. No pericholecystic fluid. Common bile duct: Diameter: 2 mm Liver: No focal lesion identified. Within normal limits in parenchymal echogenicity. IVC: No abnormality visualized. Pancreas: Visualized portion unremarkable. Spleen: Size and appearance within normal limits. Right Kidney: Length: 11.3 cm. Normal parenchymal echogenicity. No mass or focal lesion. Mild dilation of the right intrarenal collecting system. This may be due to the gravid uterus. No renal stones. Left Kidney: Length: 12.4 cm. Echogenicity within normal limits. No mass or hydronephrosis visualized. Abdominal aorta: No aneurysm visualized. Other findings: None. IMPRESSION: 1. No evidence of acute cholecystitis. 4 mm gallbladder polyp. Gallbladder otherwise unremarkable. No bile duct dilation. 2. Mild dilation of the right intrarenal collecting system. Suspect that this is due to extrinsic pressure on the right ureter from the gravid uterus. Consider a ureteral stone if there are consistent clinical symptoms. 3. Exam otherwise unremarkable. Electronically Signed   By: David  Ormond M.D.   On: 12/29/2016 17:42   MAU Course: Orders Placed This Encounter  Procedures  . US Abdomen Complete  . Urinalysis, Routine w reflex microscopic  . CBC with Differential/Platelet  . Comprehensive metabolic panel  . Lipase, blood  . Amylase  . Diet clear liquid Room service appropriate? Yes; Fluid consistency: Thin  . Insert peripheral IV   Meds ordered this encounter  Medications  . HYDROmorphone (DILAUDID) injection 1 mg  . lactated ringers bolus 1,000 mL  . pantoprazole (PROTONIX) injection 40 mg  . ondansetron  (ZOFRAN) injection 4 mg  . HYDROmorphone (DILAUDID) injection 1 mg  . ketorolac (TORADOL) 30 MG/ML injection 30 mg  . lactated ringers bolus 1,000 mL  . DISCONTD: ondansetron (ZOFRAN) injection 4 mg  . oxyCODONE-acetaminophen (PERCOCET/ROXICET) 5-325 MG per tablet 2 tablet  . ondansetron (ZOFRAN-ODT) disintegrating tablet 8 mg   Discussed Hx, exam, labs w/ Dr. Stinson. Recommends General Surgery consult. May need to Obs for IV pain meds and antiemetics. Pain only down to 7/10 after 3 mg Dilaudid, 30 mg Toradol.   Pt wants trial of PO fluids and meds. Pain is back to 8/10. Requesting pain meds. Will give another dose of Zofran first.   Pt vomited. Will Obs overnight. Dr. Martin, General Surgery at [facility] for consult. Agrees w/ POC. No new orders. NPO. His partner will come reassess in am.   MDM: - Symptomatic Gallbladder polyp. Failed trial of POs. No evidence of Cholecystitis or obstruction.    Assessment: 1. Gallbladder polyp   2. Abdominal pain affecting pregnancy     Plan: 23 hour Obs on High Risk OB unit IV pain meds and antiemetics. NPO General Surgeon to reeval in am.  Breana Litts, CNM 12/29/2016 9:24 PM   

## 2016-12-29 NOTE — Consult Note (Signed)
Chief Complaint:  Acute postprandial epigastric pain, nausea and vomiting  History of Present Illness:  Kaitlin Montgomery is an 21 y.o. female who is about [redacted] weeks gestation had onset of symptoms tonight after eating and presented to [employer].  She was seen and evaluated and an ultrasound of her gallbladder showed a polyp but no evidence of cholecystitis.  Labs were unremarkable except for a slight bump in her amylase with a normal lipase.  I saw her in the eval area.  Her pain was controlled with IV pain meds.    Past Medical History:  Diagnosis Date  . Anxiety 06/10/2015  . Bronchitis   . Bronchitis   . Depressed mood 07/01/2015  . 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 Keloid 02/11/2015  . Seasonal allergies 04/09/2015    Past Surgical History:  Procedure Laterality Date  . EXTERNAL EAR SURGERY Right     Current Facility-Administered Medications  Medication Dose Route Frequency Provider Last Rate Last Dose  . acetaminophen (TYLENOL) tablet 650 mg  650 mg Oral Q4H PRN Tanna Savoy Stinson, DO      . calcium carbonate (TUMS - dosed in mg elemental calcium) chewable tablet 400 mg of elemental calcium  2 tablet Oral Q4H PRN Truett Mainland, DO      . [START ON 12/30/2016] docusate sodium (COLACE) capsule 100 mg  100 mg Oral Daily Tanna Savoy Stinson, DO      . HYDROmorphone (DILAUDID) injection 1 mg  1 mg Intravenous Q2H PRN Manya Silvas, CNM   1 mg at 12/29/16 1741  . lactated ringers infusion   Intravenous Continuous Truett Mainland, DO   Stopped at 12/29/16 1927  . [START ON 12/30/2016] prenatal multivitamin tablet 1 tablet  1 tablet Oral Q1200 Tanna Savoy Stinson, DO      . zolpidem [facility]) tablet 5 mg  5 mg Oral QHS PRN Truett Mainland, DO       Patient has no known allergies. Family History  Problem Relation Age of Onset  . Cancer Maternal Grandmother    Social History:   reports that she quit smoking about 4 months ago. Her smoking use included Cigars. She has never used smokeless tobacco. She  reports that she does not drink alcohol or use drugs.   REVIEW OF SYSTEMS : Negative except for see problem list  Physical Exam:   Blood pressure 91/70, pulse (!) 107, temperature 98.1 F (36.7 C), temperature source Oral, resp. rate 20, weight 67.9 kg (149 lb 12.8 oz), last menstrual period 07/10/2016, SpO2 100 %. Body mass index is 23.46 kg/m.  Gen:  WDWN AAF lying on her right side Neurological: Alert and oriented to person, place, and time. Motor and sensory function is grossly intact  Breast:  Not examined Respiratory: Effort normal.   Abdomen:  Appropriate for gestational age.  Pain in epigastrium.  She reports some pain radiating into her back at the onset GU:  Not examined   LABORATORY RESULTS: Results for orders placed or performed during the hospital encounter of 12/29/16 (from the past 48 hour(s))  Urinalysis, Routine w reflex microscopic     Status: Abnormal   Collection Time: 12/29/16  2:30 PM  Result Value Ref Range   Color, Urine YELLOW YELLOW   APPearance HAZY (A) CLEAR   Specific Gravity, Urine 1.018 1.005 - 1.030   pH 6.0 5.0 - 8.0   Glucose, UA NEGATIVE NEGATIVE mg/dL   Hgb urine dipstick NEGATIVE NEGATIVE   Bilirubin Urine NEGATIVE NEGATIVE  Ketones, ur NEGATIVE NEGATIVE mg/dL   Protein, ur NEGATIVE NEGATIVE mg/dL   Nitrite NEGATIVE NEGATIVE   Leukocytes, UA NEGATIVE NEGATIVE  CBC with Differential/Platelet     Status: Abnormal   Collection Time: 12/29/16  2:34 PM  Result Value Ref Range   WBC 7.3 4.0 - 10.5 K/uL   RBC 2.97 (L) 3.87 - 5.11 MIL/uL   Hemoglobin 9.2 (L) 12.0 - 15.0 g/dL   HCT 54.1 (L) 53.1 - 60.3 %   MCV 85.5 78.0 - 100.0 fL   MCH 31.0 26.0 - 34.0 pg   MCHC 36.2 (H) 30.0 - 36.0 g/dL   RDW 80.3 05.6 - 54.9 %   Platelets 160 150 - 400 K/uL   Neutrophils Relative % 68 %   Neutro Abs 5.0 1.7 - 7.7 K/uL   Lymphocytes Relative 30 %   Lymphs Abs 2.2 0.7 - 4.0 K/uL   Monocytes Relative 0 %   Monocytes Absolute 0.0 (L) 0.1 - 1.0 K/uL    Eosinophils Relative 2 %   Eosinophils Absolute 0.1 0.0 - 0.7 K/uL   Basophils Relative 0 %   Basophils Absolute 0.0 0.0 - 0.1 K/uL  Comprehensive metabolic panel     Status: Abnormal   Collection Time: 12/29/16  2:34 PM  Result Value Ref Range   Sodium 135 135 - 145 mmol/L   Potassium 3.3 (L) 3.5 - 5.1 mmol/L   Chloride 105 101 - 111 mmol/L   CO2 22 22 - 32 mmol/L   Glucose, Bld 82 65 - 99 mg/dL   BUN <5 (L) 6 - 20 mg/dL    Comment: REPEATED TO VERIFY   Creatinine, Ser 0.42 (L) 0.44 - 1.00 mg/dL   Calcium 9.0 8.9 - 96.7 mg/dL   Total Protein 7.0 6.5 - 8.1 g/dL   Albumin 3.6 3.5 - 5.0 g/dL   AST 20 15 - 41 U/L   ALT 7 (L) 14 - 54 U/L   Alkaline Phosphatase 41 38 - 126 U/L   Total Bilirubin 0.3 0.3 - 1.2 mg/dL   GFR calc non Af Amer >60 >60 mL/min   GFR calc Af Amer >60 >60 mL/min    Comment: (NOTE) The eGFR has been calculated using the CKD EPI equation. This calculation has not been validated in all clinical situations. eGFR's persistently <60 mL/min signify possible Chronic Kidney Disease.    Anion gap 8 5 - 15  Lipase, blood     Status: None   Collection Time: 12/29/16  2:34 PM  Result Value Ref Range   Lipase 20 11 - 51 U/L  Amylase     Status: Abnormal   Collection Time: 12/29/16  2:34 PM  Result Value Ref Range   Amylase 117 (H) 28 - 100 U/L     RADIOLOGY RESULTS: US Abdomen Complete  Result Date: 12/29/2016 CLINICAL DATA:  Right upper quadrant pain after eating 3 hours ago. Twenty-four weeks pregnant. EXAM: ABDOMEN ULTRASOUND COMPLETE COMPARISON:  None. FINDINGS: Gallbladder: 4 mm polyp. No shadowing stones. No wall thickening. No pericholecystic fluid. Common bile duct: Diameter: 2 mm Liver: No focal lesion identified. Within normal limits in parenchymal echogenicity. IVC: No abnormality visualized. Pancreas: Visualized portion unremarkable. Spleen: Size and appearance within normal limits. Right Kidney: Length: 11.3 cm. Normal parenchymal echogenicity. No mass or  focal lesion. Mild dilation of the right intrarenal collecting system. This may be due to the gravid uterus. No renal stones. Left Kidney: Length: 12.4 cm. Echogenicity within normal limits. No mass or hydronephrosis  visualized. Abdominal aorta: No aneurysm visualized. Other findings: None. IMPRESSION: 1. No evidence of acute cholecystitis. 4 mm gallbladder polyp. Gallbladder otherwise unremarkable. No bile duct dilation. 2. Mild dilation of the right intrarenal collecting system. Suspect that this is due to extrinsic pressure on the right ureter from the gravid uterus. Consider a ureteral stone if there are consistent clinical symptoms. 3. Exam otherwise unremarkable. Electronically Signed   By: Lajean Manes M.D.   On: 12/29/2016 17:42    Problem List: Patient Active Problem List   Diagnosis Date Noted  . Nausea and vomiting 12/29/2016  . Supervision of normal first teen pregnancy 09/14/2016  . Depressed mood 07/01/2015  . Anxiety 06/10/2015    Assessment & Plan: Will recheck amylase and lipase in the AM.  CCS MD will follow with you.    Matt B. Hassell Done, MD, [facility] [phone] beeper [phone]  12/29/2016 9:07 PM

## 2016-12-29 NOTE — Progress Notes (Signed)
Pt states pain is the same, she is sleepy & more restful but pain is same intensity.

## 2016-12-29 NOTE — H&P (Signed)
Chief Complaint:  Abdominal Pain   First Provider Initiated Contact with Patient 12/29/16 1458     HPI: Kaitlin Montgomery is a 21 y.o. G1P0000 at [redacted]w[redacted]d who presents to maternity admissions reporting w/ sudden onset of severe epigastric pain after eating bacon eggs and waffles for lunch. No Hx similar pain.   Location: Upper mid abd Quality: sharp Severity: 10/10 in pain scale Duration: Since 1330 Context: after eating Timing: Constant Modifying factors: Hasn't tried anything for the pain. Unable to lie back 2/2 pain.  Associated signs and symptoms: Initially neg for N/V, but vomited after Dilaudid in MAU. Neg for fever, chills,   Denies contractions, leakage of fluid or vaginal bleeding. Good fetal movement.   Past Medical History:  Diagnosis Date  . Anxiety 06/10/2015  . Bronchitis   . Bronchitis   . Depressed mood 07/01/2015  . 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 Keloid 02/11/2015  . Seasonal allergies 04/09/2015   OB History  Gravida Para Term Preterm AB Living  1       0 0  SAB TAB Ectopic Multiple Live Births  0            # Outcome Date GA Lbr Len/2nd Weight Sex Delivery Anes PTL Lv  1 Current              Past Surgical History:  Procedure Laterality Date  . EXTERNAL EAR SURGERY Right    Family History  Problem Relation Age of Onset  . Cancer Maternal Grandmother    Social History  Substance Use Topics  . Smoking status: Former Smoker    Types: Cigars    Quit date: 08/25/2016  . Smokeless tobacco: Never Used  . Alcohol use No   No Known Allergies Prescriptions Prior to Admission  Medication Sig Dispense Refill Last Dose  . Prenatal Vit-Fe Fumarate-FA (PREPLUS) 27-1 MG TABS Take 1 tablet by mouth daily. 30 tablet 13 Past Week at Unknown time  . ondansetron (ZOFRAN ODT) 4 MG disintegrating tablet Take 1 tablet (4 mg total) by mouth every 8 (eight) hours as needed for nausea or vomiting. (Patient not taking: Reported on 10/29/2016) 20 tablet 0 Not Taking at  Unknown time  . promethazine (PHENERGAN) 25 MG tablet Take 1 tablet (25 mg total) by mouth every 6 (six) hours as needed for nausea or vomiting. (Patient not taking: Reported on 10/29/2016) 30 tablet 2 Not Taking at Unknown time  . terconazole (TERAZOL 3) 0.8 % vaginal cream Place 1 applicator vaginally at bedtime. (Patient not taking: Reported on 12/29/2016) 20 g 0 Not Taking at Unknown time    I have reviewed patient's Past Medical Hx, Surgical Hx, Family Hx, Social Hx, medications and allergies.   ROS:  Review of Systems  Constitutional: Negative for appetite change, chills and fever.  Gastrointestinal: Positive for abdominal pain, nausea (Since arrival to MAU) and vomiting (since arrival to MAU). Negative for abdominal distention, constipation and diarrhea.  Genitourinary: Negative for dysuria, hematuria and vaginal bleeding.  Musculoskeletal: Positive for back pain (pain radiating through mid back).    Physical Exam  Patient Vitals for the past 24 hrs:  BP Temp Temp src Pulse Resp Weight  12/29/16 1743 110/63 97.8 F (36.6 C) Oral 82 16 -  12/29/16 1421 94/58 99.2 F (37.3 C) Oral 96 20 149 lb 12.8 oz (67.9 kg)   Constitutional: Well-developed, well-nourished female in severe distress.  Cardiovascular: normal rate Respiratory: normal effort GI: Abd soft, moderate, gravid appropriate for gestational age. Pos BS  x 4 Neurologic: Alert and oriented x 4.  GU: Neg CVAT.  Pelvic: deferred    FHT:  Baseline 140 , moderate variability, 10x10 accelerations present, no decelerations. Very limited tracing initially because pt unable to sit back for monitoring. Monitor held in place by CNM.  Contractions: None   Labs: Results for orders placed or performed during the hospital encounter of 12/29/16 (from the past 24 hour(s))  Urinalysis, Routine w reflex microscopic     Status: Abnormal   Collection Time: 12/29/16  2:30 PM  Result Value Ref Range   Color, Urine YELLOW YELLOW    APPearance HAZY (A) CLEAR   Specific Gravity, Urine 1.018 1.005 - 1.030   pH 6.0 5.0 - 8.0   Glucose, UA NEGATIVE NEGATIVE mg/dL   Hgb urine dipstick NEGATIVE NEGATIVE   Bilirubin Urine NEGATIVE NEGATIVE   Ketones, ur NEGATIVE NEGATIVE mg/dL   Protein, ur NEGATIVE NEGATIVE mg/dL   Nitrite NEGATIVE NEGATIVE   Leukocytes, UA NEGATIVE NEGATIVE  CBC with Differential/Platelet     Status: Abnormal   Collection Time: 12/29/16  2:34 PM  Result Value Ref Range   WBC 7.3 4.0 - 10.5 K/uL   RBC 2.97 (L) 3.87 - 5.11 MIL/uL   Hemoglobin 9.2 (L) 12.0 - 15.0 g/dL   HCT 95.625.4 (L) 21.336.0 - 08.646.0 %   MCV 85.5 78.0 - 100.0 fL   MCH 31.0 26.0 - 34.0 pg   MCHC 36.2 (H) 30.0 - 36.0 g/dL   RDW 57.812.7 46.911.5 - 62.915.5 %   Platelets 160 150 - 400 K/uL   Neutrophils Relative % 68 %   Neutro Abs 5.0 1.7 - 7.7 K/uL   Lymphocytes Relative 30 %   Lymphs Abs 2.2 0.7 - 4.0 K/uL   Monocytes Relative 0 %   Monocytes Absolute 0.0 (L) 0.1 - 1.0 K/uL   Eosinophils Relative 2 %   Eosinophils Absolute 0.1 0.0 - 0.7 K/uL   Basophils Relative 0 %   Basophils Absolute 0.0 0.0 - 0.1 K/uL  Comprehensive metabolic panel     Status: Abnormal   Collection Time: 12/29/16  2:34 PM  Result Value Ref Range   Sodium 135 135 - 145 mmol/L   Potassium 3.3 (L) 3.5 - 5.1 mmol/L   Chloride 105 101 - 111 mmol/L   CO2 22 22 - 32 mmol/L   Glucose, Bld 82 65 - 99 mg/dL   BUN <5 (L) 6 - 20 mg/dL   Creatinine, Ser 5.280.42 (L) 0.44 - 1.00 mg/dL   Calcium 9.0 8.9 - 41.310.3 mg/dL   Total Protein 7.0 6.5 - 8.1 g/dL   Albumin 3.6 3.5 - 5.0 g/dL   AST 20 15 - 41 U/L   ALT 7 (L) 14 - 54 U/L   Alkaline Phosphatase 41 38 - 126 U/L   Total Bilirubin 0.3 0.3 - 1.2 mg/dL   GFR calc non Af Amer >60 >60 mL/min   GFR calc Af Amer >60 >60 mL/min   Anion gap 8 5 - 15  Lipase, blood     Status: None   Collection Time: 12/29/16  2:34 PM  Result Value Ref Range   Lipase 20 11 - 51 U/L  Amylase     Status: Abnormal   Collection Time: 12/29/16  2:34 PM  Result  Value Ref Range   Amylase 117 (H) 28 - 100 U/L    Imaging:  [place] Abdomen Complete  Result Date: 12/29/2016 CLINICAL DATA:  Right upper quadrant pain after eating 3  hours ago. Twenty-four weeks pregnant. EXAM: ABDOMEN ULTRASOUND COMPLETE COMPARISON:  None. FINDINGS: Gallbladder: 4 mm polyp. No shadowing stones. No wall thickening. No pericholecystic fluid. Common bile duct: Diameter: 2 mm Liver: No focal lesion identified. Within normal limits in parenchymal echogenicity. IVC: No abnormality visualized. Pancreas: Visualized portion unremarkable. Spleen: Size and appearance within normal limits. Right Kidney: Length: 11.3 cm. Normal parenchymal echogenicity. No mass or focal lesion. Mild dilation of the right intrarenal collecting system. This may be due to the gravid uterus. No renal stones. Left Kidney: Length: 12.4 cm. Echogenicity within normal limits. No mass or hydronephrosis visualized. Abdominal aorta: No aneurysm visualized. Other findings: None. IMPRESSION: 1. No evidence of acute cholecystitis. 4 mm gallbladder polyp. Gallbladder otherwise unremarkable. No bile duct dilation. 2. Mild dilation of the right intrarenal collecting system. Suspect that this is due to extrinsic pressure on the right ureter from the gravid uterus. Consider a ureteral stone if there are consistent clinical symptoms. 3. Exam otherwise unremarkable. Electronically Signed   By: Amie Portland M.D.   On: 12/29/2016 17:42   MAU Course: Orders Placed This Encounter  Procedures  . US Abdomen Complete  . Urinalysis, Routine w reflex microscopic  . CBC with Differential/Platelet  . Comprehensive metabolic panel  . Lipase, blood  . Amylase  . Diet clear liquid Room service appropriate? Yes; Fluid consistency: Thin  . Insert peripheral IV   Meds ordered this encounter  Medications  . HYDROmorphone (DILAUDID) injection 1 mg  . lactated ringers bolus 1,000 mL  . pantoprazole (PROTONIX) injection 40 mg  . ondansetron  (ZOFRAN) injection 4 mg  . HYDROmorphone (DILAUDID) injection 1 mg  . ketorolac (TORADOL) 30 MG/ML injection 30 mg  . lactated ringers bolus 1,000 mL  . DISCONTD: ondansetron (ZOFRAN) injection 4 mg  . oxyCODONE-acetaminophen (PERCOCET/ROXICET) 5-325 MG per tablet 2 tablet  . ondansetron (ZOFRAN-ODT) disintegrating tablet 8 mg   Discussed Hx, exam, labs w/ Dr. Adrian Blackwater. Recommends General Surgery consult. May need to Obs for IV pain meds and antiemetics. Pain only down to 7/10 after 3 mg Dilaudid, 30 mg Toradol.   Pt wants trial of PO fluids and meds. Pain is back to 8/10. Requesting pain meds. Will give another dose of Zofran first.   Pt vomited. Will Obs overnight. Dr. Daphine Deutscher, General Surgery at [facility] for consult. Agrees w/ POC. No new orders. NPO. His partner will come reassess in am.   MDM: - Symptomatic Gallbladder polyp. Failed trial of POs. No evidence of Cholecystitis or obstruction.    Assessment: 1. Gallbladder polyp   2. Abdominal pain affecting pregnancy     Plan: 23 hour Obs on High Risk OB unit IV pain meds and antiemetics. NPO General Surgeon to reeval in am.  Dorathy Kinsman, CNM 12/29/2016 9:24 PM

## 2016-12-29 NOTE — Progress Notes (Signed)
Assumed care for shift of change. IV saline locked.   1940: pt still vomiting. Orders received for IV phenergan.  1952: IV phenergan up at bolus per order.   2030: MD at bs. pt admitted up to 3rd floor. Floor notified and report given.  2047: pt up to 3rd floor via wheelchair with family

## 2016-12-29 NOTE — Progress Notes (Signed)
Pt sitting straight up leaning over, unable to trace FHR, pt in pain, dilaudid given IM.  Pt wanting monitor off, explained need to obtain FHR tracing.  Will try again when patient is more comfortable.

## 2016-12-29 NOTE — Telephone Encounter (Signed)
Returned call and advised patient that I spoke with her pharmacy and they said that her prenatal vitamins have been there ready for 3 days.

## 2016-12-30 DIAGNOSIS — O99612 Diseases of the digestive system complicating pregnancy, second trimester: Principal | ICD-10-CM

## 2016-12-30 DIAGNOSIS — K824 Cholesterolosis of gallbladder: Secondary | ICD-10-CM

## 2016-12-30 DIAGNOSIS — Z3A24 24 weeks gestation of pregnancy: Secondary | ICD-10-CM

## 2016-12-30 DIAGNOSIS — R748 Abnormal levels of other serum enzymes: Secondary | ICD-10-CM | POA: Diagnosis present

## 2016-12-30 LAB — CBC
HCT: 24.7 % — ABNORMAL LOW (ref 36.0–46.0)
HEMOGLOBIN: 8.8 g/dL — AB (ref 12.0–15.0)
MCH: 30 pg (ref 26.0–34.0)
MCHC: 35.6 g/dL (ref 30.0–36.0)
MCV: 84.3 fL (ref 78.0–100.0)
Platelets: 149 10*3/uL — ABNORMAL LOW (ref 150–400)
RBC: 2.93 MIL/uL — ABNORMAL LOW (ref 3.87–5.11)
RDW: 12.7 % (ref 11.5–15.5)
WBC: 12.3 10*3/uL — ABNORMAL HIGH (ref 4.0–10.5)

## 2016-12-30 LAB — AMYLASE: Amylase: 83 U/L (ref 28–100)

## 2016-12-30 LAB — LIPASE, BLOOD: LIPASE: 17 U/L (ref 11–51)

## 2016-12-30 NOTE — Progress Notes (Signed)
Patient ID: Kaitlin Montgomery, female   DOB: 29-Dec-1995, 21 y.o.   MRN: [id number] FACULTY PRACTICE ANTEPARTUM NOTE  Kaitlin BangDestiny Ueda is a 21 y.o. G1P0000 at 1671w5d  who is admitted for adbominal pain.   Fetal presentation is unsure. Length of Stay:  1  Days  Subjective: Reports that abdominal pain is improved somewhat, still worse with movement. Has no appetite at the moment.  Patient reports good fetal movement.   She reports no uterine contractions She reports no bleeding  She reports no loss of fluid per vagina.  Vitals:  Blood pressure 104/60, pulse 73, temperature 98.3 F (36.8 C), temperature source Oral, resp. rate 16, height 5\' 7"  (1.702 m), weight 149 lb 12.8 oz (67.9 kg), last menstrual period 07/10/2016, SpO2 100 %. Physical Examination:  General appearance - alert, well appearing, and in no distress Chest - clear to auscultation, no wheezes, rales or rhonchi, symmetric air entry Heart - normal rate, regular rhythm, normal S1, S2, no murmurs, rubs, clicks or gallops Abdomen - tenderness noted RUQ, epigastric and LUQ areas. No rebound or guarding. Abd soft. Gravid to dates Extremities: extremities normal, atraumatic, no cyanosis or edema and Homans sign is negative, no sign of DVT   Fetal Monitoring:  Baseline: 130 bpm, Variability: Good {> 6 bpm), Accelerations: Non-reactive but appropriate for gestational age and Decelerations: Absent  Labs:  Results for orders placed or performed during the hospital encounter of 12/29/16 (from the past 24 hour(s))  Urinalysis, Routine w reflex microscopic   Collection Time: 12/29/16  2:30 PM  Result Value Ref Range   Color, Urine YELLOW YELLOW   APPearance HAZY (A) CLEAR   Specific Gravity, Urine 1.018 1.005 - 1.030   pH 6.0 5.0 - 8.0   Glucose, UA NEGATIVE NEGATIVE mg/dL   Hgb urine dipstick NEGATIVE NEGATIVE   Bilirubin Urine NEGATIVE NEGATIVE   Ketones, ur NEGATIVE NEGATIVE mg/dL   Protein, ur NEGATIVE NEGATIVE mg/dL   Nitrite  NEGATIVE NEGATIVE   Leukocytes, UA NEGATIVE NEGATIVE  CBC with Differential/Platelet   Collection Time: 12/29/16  2:34 PM  Result Value Ref Range   WBC 7.3 4.0 - 10.5 K/uL   RBC 2.97 (L) 3.87 - 5.11 MIL/uL   Hemoglobin 9.2 (L) 12.0 - 15.0 g/dL   HCT 40.925.4 (L) 81.136.0 - 91.446.0 %   MCV 85.5 78.0 - 100.0 fL   MCH 31.0 26.0 - 34.0 pg   MCHC 36.2 (H) 30.0 - 36.0 g/dL   RDW 78.212.7 95.611.5 - 21.315.5 %   Platelets 160 150 - 400 K/uL   Neutrophils Relative % 68 %   Neutro Abs 5.0 1.7 - 7.7 K/uL   Lymphocytes Relative 30 %   Lymphs Abs 2.2 0.7 - 4.0 K/uL   Monocytes Relative 0 %   Monocytes Absolute 0.0 (L) 0.1 - 1.0 K/uL   Eosinophils Relative 2 %   Eosinophils Absolute 0.1 0.0 - 0.7 K/uL   Basophils Relative 0 %   Basophils Absolute 0.0 0.0 - 0.1 K/uL  Comprehensive metabolic panel   Collection Time: 12/29/16  2:34 PM  Result Value Ref Range   Sodium 135 135 - 145 mmol/L   Potassium 3.3 (L) 3.5 - 5.1 mmol/L   Chloride 105 101 - 111 mmol/L   CO2 22 22 - 32 mmol/L   Glucose, Bld 82 65 - 99 mg/dL   BUN <5 (L) 6 - 20 mg/dL   Creatinine, Ser 0.860.42 (L) 0.44 - 1.00 mg/dL   Calcium 9.0 8.9 - 57.810.3 mg/dL  Total Protein 7.0 6.5 - 8.1 g/dL   Albumin 3.6 3.5 - 5.0 g/dL   AST 20 15 - 41 U/L   ALT 7 (L) 14 - 54 U/L   Alkaline Phosphatase 41 38 - 126 U/L   Total Bilirubin 0.3 0.3 - 1.2 mg/dL   GFR calc non Af Amer >60 >60 mL/min   GFR calc Af Amer >60 >60 mL/min   Anion gap 8 5 - 15  Lipase, blood   Collection Time: 12/29/16  2:34 PM  Result Value Ref Range   Lipase 20 11 - 51 U/L  Amylase   Collection Time: 12/29/16  2:34 PM  Result Value Ref Range   Amylase 117 (H) 28 - 100 U/L  Type and screen [facility]   Collection Time: 12/29/16  9:19 PM  Result Value Ref Range   ABO/RH(D) O POS    Antibody Screen NEG    Sample Expiration 01/01/2017   ABO/Rh   Collection Time: 12/29/16  9:19 PM  Result Value Ref Range   ABO/RH(D) O POS   Comprehensive metabolic panel   Collection  Time: 12/30/16  5:41 AM  Result Value Ref Range   Sodium 127 (L) 135 - 145 mmol/L   Potassium 3.3 (L) 3.5 - 5.1 mmol/L   Chloride 98 (L) 101 - 111 mmol/L   CO2 21 (L) 22 - 32 mmol/L   Glucose, Bld 92 65 - 99 mg/dL   BUN <5 (L) 6 - 20 mg/dL   Creatinine, Ser 7.84 (L) 0.44 - 1.00 mg/dL   Calcium 8.5 (L) 8.9 - 10.3 mg/dL   Total Protein 6.2 (L) 6.5 - 8.1 g/dL   Albumin 3.1 (L) 3.5 - 5.0 g/dL   AST 23 15 - 41 U/L   ALT 6 (L) 14 - 54 U/L   Alkaline Phosphatase 38 38 - 126 U/L   Total Bilirubin 0.5 0.3 - 1.2 mg/dL   GFR calc non Af Amer >60 >60 mL/min   GFR calc Af Amer >60 >60 mL/min   Anion gap 8 5 - 15  CBC   Collection Time: 12/30/16  5:41 AM  Result Value Ref Range   WBC 12.3 (H) 4.0 - 10.5 K/uL   RBC 2.93 (L) 3.87 - 5.11 MIL/uL   Hemoglobin 8.8 (L) 12.0 - 15.0 g/dL   HCT 69.6 (L) 29.5 - 28.4 %   MCV 84.3 78.0 - 100.0 fL   MCH 30.0 26.0 - 34.0 pg   MCHC 35.6 30.0 - 36.0 g/dL   RDW 13.2 44.0 - 10.2 %   Platelets 149 (L) 150 - 400 K/uL  Lipase, blood   Collection Time: 12/30/16  5:41 AM  Result Value Ref Range   Lipase 17 11 - 51 U/L  Amylase   Collection Time: 12/30/16  5:41 AM  Result Value Ref Range   Amylase 83 28 - 100 U/L    Imaging Studies:       Medications:  Scheduled . docusate sodium  100 mg Oral Daily  . prenatal multivitamin  1 tablet Oral Q1200   I have reviewed the patient's current medications.  ASSESSMENT: Active Problems:   Nausea and vomiting   Gall bladder polyp   Elevated amylase   PLAN: 1. N/V  Improved - will wait to advance diet until Gen surg sees patient, but unlikely to proceed to surgery if abd pain improving. 2. GB polyp  Appreciate Gen Surg input. 3. Elevated Amylase  Repeat this AM normal. Likely elevated  due to vomiting. Continue routine antenatal care.   Levie Heritage, DO 12/30/2016,8:00 AM

## 2016-12-30 NOTE — Progress Notes (Signed)
Subjective: Complaining of generalized abdominal pain.  Not really in the RUQ now.  She vomited about 20 minutes ago.  Not febrile,  Objective: Vital signs in last 24 hours: Temp:  [97.8 F (36.6 C)-98.3 F (36.8 C)] 98.3 F (36.8 C) (02/21 1209) Pulse Rate:  [69-107] 70 (02/21 1209) Resp:  [16-20] 16 (02/21 1209) BP: (91-120)/(60-70) 120/63 (02/21 1209) SpO2:  [98 %-100 %] 98 % (02/21 1209) Weight:  [67.9 kg (149 lb 12.8 oz)] 67.9 kg (149 lb 12.8 oz) (02/20 2215)    NO I/O Afebrile, VSS Na 127 K+ 3.3 LFT/lipase is normal WBC is up and anemia is worse Gallbladder: 4 mm polyp. No shadowing stones. No wall thickening. No pericholecystic fluid. Common bile duct: Diameter: 2 mm [place]:    Intake/Output from previous day: No intake/output data recorded. Intake/Output this shift: No intake/output data recorded.  General appearance: alert, cooperative and no distress GI: soft, not really distended, complains of generalized pain mid abdomen, not overly tender on palpation at any site, few BS.    Lab Results:   Recent Labs  12/29/16 1434 12/30/16 0541  WBC 7.3 12.3*  HGB 9.2* 8.8*  HCT 25.4* 24.7*  PLT 160 149*    BMET  Recent Labs  12/29/16 1434 12/30/16 0541  NA 135 127*  K 3.3* 3.3*  CL 105 98*  CO2 22 21*  GLUCOSE 82 92  BUN <5* <5*  CREATININE 0.42* 0.37*  CALCIUM 9.0 8.5*   PT/INR No results for input(s): LABPROT, INR in the last 72 hours.   Recent Labs Lab 12/29/16 1434 12/30/16 0541  AST 20 23  ALT 7* 6*  ALKPHOS 41 38  BILITOT 0.3 0.5  PROT 7.0 6.2*  ALBUMIN 3.6 3.1*     Lipase     Component Value Date/Time   LIPASE 17 12/30/2016 0541     Studies/Results: US Abdomen Complete  Result Date: 12/29/2016 CLINICAL DATA:  Right upper quadrant pain after eating 3 hours ago. Twenty-four weeks pregnant. EXAM: ABDOMEN ULTRASOUND COMPLETE COMPARISON:  None. FINDINGS: Gallbladder: 4 mm polyp. No shadowing stones. No wall thickening. No  pericholecystic fluid. Common bile duct: Diameter: 2 mm Liver: No focal lesion identified. Within normal limits in parenchymal echogenicity. IVC: No abnormality visualized. Pancreas: Visualized portion unremarkable. Spleen: Size and appearance within normal limits. Right Kidney: Length: 11.3 cm. Normal parenchymal echogenicity. No mass or focal lesion. Mild dilation of the right intrarenal collecting system. This may be due to the gravid uterus. No renal stones. Left Kidney: Length: 12.4 cm. Echogenicity within normal limits. No mass or hydronephrosis visualized. Abdominal aorta: No aneurysm visualized. Other findings: None. IMPRESSION: 1. No evidence of acute cholecystitis. 4 mm gallbladder polyp. Gallbladder otherwise unremarkable. No bile duct dilation. 2. Mild dilation of the right intrarenal collecting system. Suspect that this is due to extrinsic pressure on the right ureter from the gravid uterus. Consider a ureteral stone if there are consistent clinical symptoms. 3. Exam otherwise unremarkable. Electronically Signed   By: Amie Portland M.D.   On: 12/29/2016 17:42   Prior to Admission medications   Medication Sig Start Date End Date Taking? Authorizing Provider  Prenatal Vit-Fe Fumarate-FA (PREPLUS) 27-1 MG TABS Take 1 tablet by mouth daily. 09/14/16  Yes Ugonna A Anyanwu, MD  ondansetron (ZOFRAN ODT) 4 MG disintegrating tablet Take 1 tablet (4 mg total) by mouth every 8 (eight) hours as needed for nausea or vomiting. Patient not taking: Reported on 10/29/2016 09/26/16   Duane Lope, NP  promethazine (PHENERGAN) 25 MG tablet Take 1 tablet (25 mg total) by mouth every 6 (six) hours as needed for nausea or vomiting. Patient not taking: Reported on 10/29/2016 09/14/16   Tereso NewcomerUgonna A Anyanwu, MD  terconazole (TERAZOL 3) 0.8 % vaginal cream Place 1 applicator vaginally at bedtime. Patient not taking: Reported on 12/29/2016 12/21/16   Roe Coombsachelle A Denney, CNM    Medications: . docusate sodium  100 mg Oral  Daily  . prenatal multivitamin  1 tablet Oral Q1200   . lactated ringers Stopped (12/29/16 1927)   Assessment/Plan Epigastric pain after eating/moving in bed 4724w5days gestation Gallbladder polyp no stones, no wall thickening, no pericholecystic fluid, CBD 2 mm, mild intra renal collecting sx dilation.   Hyponatremia Hypokalemia   FEN: no IV fluids listed/NPO ID: no abx DVT:  SCD   Plan:  Will review with Dr. Ezzard StandingNewman.  I would resume fluids and correct her electrolytes.    Keep her NPO for now if she is vomiting.      LOS: 0 days    JENNINGS,WILLARD 12/30/2016 [phone]  Agree with above. Fiancee in room.  Unremarkable exam.  Amylase and lipase both normal.  She is hungry and wants something to eat.  Will start full liquids and advance diet in AM.    There are no acute surgical issues.  If she is doing well, may go home.  Ovidio Kinavid Marzelle Rutten, MD, [facility] Pager: [phone] Office phone:  [phone]

## 2016-12-31 ENCOUNTER — Encounter (HOSPITAL_COMMUNITY): Payer: Self-pay | Admitting: *Deleted

## 2016-12-31 DIAGNOSIS — O99612 Diseases of the digestive system complicating pregnancy, second trimester: Secondary | ICD-10-CM | POA: Diagnosis not present

## 2016-12-31 LAB — COMPREHENSIVE METABOLIC PANEL
ALBUMIN: 3.1 g/dL — AB (ref 3.5–5.0)
ALK PHOS: 38 U/L (ref 38–126)
ALT: 6 U/L — AB (ref 14–54)
ANION GAP: 8 (ref 5–15)
AST: 23 U/L (ref 15–41)
BUN: <5 mg/dL — ABNORMAL LOW (ref 6–20)
CALCIUM: 8.5 mg/dL — AB (ref 8.9–10.3)
CHLORIDE: 98 mmol/L — AB (ref 101–111)
CO2: 21 mmol/L — AB (ref 22–32)
CREATININE: 0.37 mg/dL — AB (ref 0.44–1.00)
GFR calc Af Amer: >60 mL/min (ref >60)
GFR calc non Af Amer: >60 mL/min (ref >60)
GLUCOSE: 92 mg/dL (ref 65–99)
Potassium: 3.3 mmol/L — ABNORMAL LOW (ref 3.5–5.1)
SODIUM: 127 mmol/L — AB (ref 135–145)
Total Bilirubin: 0.5 mg/dL (ref 0.3–1.2)
Total Protein: 6.2 g/dL — ABNORMAL LOW (ref 6.5–8.1)

## 2016-12-31 LAB — ABO/RH: ABO/RH(D): O POS

## 2016-12-31 MED ORDER — PROMETHAZINE HCL 25 MG PO TABS: 25.0000 mg | ORAL_TABLET | Freq: Four times a day (QID) | ORAL | 1 refills | Status: DC | PRN

## 2016-12-31 NOTE — Discharge Instructions (Signed)
Fat and Cholesterol Restricted Diet High levels of fat and cholesterol in your blood may lead to various health problems, such as diseases of the heart, blood vessels, gallbladder, liver, and pancreas. Fats are concentrated sources of energy that come in various forms. Certain types of fat, including saturated fat, may be harmful in excess. Cholesterol is a substance needed by your body in small amounts. Your body makes all the cholesterol it needs. Excess cholesterol comes from the food you eat. When you have high levels of cholesterol and saturated fat in your blood, health problems can develop because the excess fat and cholesterol will gather along the walls of your blood vessels, causing them to narrow. Choosing the right foods will help you control your intake of fat and cholesterol. This will help keep the levels of these substances in your blood within normal limits and reduce your risk of disease. What is my plan? Your health care provider recommends that you:  Limit your fat intake to ___10___% or less of your total calories per day.  Limit the amount of cholesterol in your diet to less than _________mg per day.  Eat 20-30 grams of fiber each day. What types of fat should I choose?  Choose healthy fats more often. Choose monounsaturated and polyunsaturated fats, such as olive and canola oil, flaxseeds, walnuts, almonds, and seeds.  Eat more omega-3 fats. Good choices include salmon, mackerel, sardines, tuna, flaxseed oil, and ground flaxseeds. Aim to eat fish at least two times a week.  Limit saturated fats. Saturated fats are primarily found in animal products, such as meats, butter, and cream. Plant sources of saturated fats include palm oil, palm kernel oil, and coconut oil.  Avoid foods with partially hydrogenated oils in them. These contain trans fats. Examples of foods that contain trans fats are stick margarine, some tub margarines, cookies, crackers, and other baked goods. What  general guidelines do I need to follow? These guidelines for healthy eating will help you control your intake of fat and cholesterol:  Check food labels carefully to identify foods with trans fats or high amounts of saturated fat.  Fill one half of your plate with vegetables and green salads.  Fill one fourth of your plate with whole grains. Look for the word "whole" as the first word in the ingredient list.  Fill one fourth of your plate with lean protein foods.  Limit fruit to two servings a day. Choose fruit instead of juice.  Eat more foods that contain fiber, such as apples, broccoli, carrots, beans, peas, and barley.  Eat more home-cooked food and less restaurant, buffet, and fast food.  Limit or avoid alcohol.  Limit foods high in starch and sugar.  Limit fried foods.  Cook foods using methods other than frying. Baking, boiling, grilling, and broiling are all great options.  Lose weight if you are overweight. Losing just 5-10% of your initial body weight can help your overall health and prevent diseases such as diabetes and heart disease. What foods can I eat? Grains  Whole grains, such as whole wheat or whole grain breads, crackers, cereals, and pasta. Unsweetened oatmeal, bulgur, barley, quinoa, or brown rice. Corn or whole wheat flour tortillas. Vegetables  Fresh or frozen vegetables (raw, steamed, roasted, or grilled). Green salads. Fruits  All fresh, canned (in natural juice), or frozen fruits. Meats and other protein foods  Ground beef (85% or leaner), grass-fed beef, or beef trimmed of fat. Skinless chicken or [place]. Ground chicken or [place]. Pork trimmed  of fat. All fish and seafood. Eggs. Dried beans, peas, or lentils. Unsalted nuts or seeds. Unsalted canned or dry beans. Dairy  Low-fat dairy products, such as skim or 1% milk, 2% or reduced-fat cheeses, low-fat ricotta or cottage cheese, or plain low-fat yo Fats and oils  Tub margarines without trans fats.  Light or reduced-fat mayonnaise and salad dressings. Avocado. Olive, canola, sesame, or safflower oils. Natural peanut or almond butter (choose ones without added sugar and oil). The items listed above may not be a complete list of recommended foods or beverages. Contact your dietitian for more options.  Foods to avoid Grains  White bread. White pasta. White rice. Cornbread. Bagels, pastries, and croissants. Crackers that contain trans fat. Vegetables  White potatoes. Corn. Creamed or fried vegetables. Vegetables in a cheese sauce. Fruits  Dried fruits. Canned fruit in light or heavy syrup. Fruit juice. Meats and other protein foods  Fatty cuts of meat. Ribs, chicken wings, bacon, sausage, bologna, salami, chitterlings, fatback, hot dogs, bratwurst, and packaged luncheon meats. Liver and organ meats. Dairy  Whole or 2% milk, cream, half-and-half, and cream cheese. Whole milk cheeses. Whole-fat or sweetened yogurt. Full-fat cheeses. Nondairy creamers and whipped toppings. Processed cheese, cheese spreads, or cheese curds. Beverages  Alcohol. Sweetened drinks (such as sodas, lemonade, and fruit drinks or punches). Fats and oils  Butter, stick margarine, lard, shortening, ghee, or bacon fat. Coconut, palm kernel, or palm oils. Sweets and desserts  Corn syrup, sugars, honey, and molasses. Candy. Jam and jelly. Syrup. Sweetened cereals. Cookies, pies, cakes, donuts, muffins, and ice cream. The items listed above may not be a complete list of foods and beverages to avoid. Contact your dietitian for more information.  This information is not intended to replace advice given to you by your health care provider. Make sure you discuss any questions you have with your health care provider. Document Released: 10/26/2005 Document Revised: 11/16/2014 Document Reviewed: 01/24/2014 Elsevier Interactive Patient Education  2017 [employer].

## 2016-12-31 NOTE — Discharge Summary (Signed)
OB Discharge Summary     Patient Name: Kaitlin Montgomery DOB: March 04, 1996 MRN: [id number]  Date of admission: 12/29/2016 Delivering MD: This patient has no babies on file.  Date of discharge: 12/31/2016  Admitting diagnosis: 24WKS, ABD PAIN, gallbladder dysfunction/polyp Intrauterine pregnancy: 9189w6d     Secondary diagnosis:  Active Problems:   Nausea and vomiting   Gall bladder polyp   Elevated amylase  Additional problems:      Discharge diagnosis: as above                                                                                                Post partum procedures:  Augmentation:   Complications:   Hospital course:  Pt presented with RUQ pain, intractable nausea vomiting.  Work up revealed gallbladder polyp, mildly elevated amylase other labs WNL.  Surgery recommended NPO and observation and patient improved.   Dr Ezzard StandingNewman stated that if pt tolerated po and felt better she could go home.  This am the patient is pain free and tolerating diet.  She will stay on clears and fulls for a couple of days.  She wants to go home.  All labs are normal  Physical exam  Vitals:   12/30/16 0752 12/30/16 1209 12/30/16 1612 12/30/16 2028  BP: 104/60 120/63 106/60 (!) 102/57  Pulse: 73 70 79 70  Resp: 16 16 17 18   Temp: 98.3 F (36.8 C) 98.3 F (36.8 C) 98.3 F (36.8 C) 98.2 F (36.8 C)  TempSrc: Oral Oral Oral Oral  SpO2:  98% 99% 99%  Weight:      Height:       General: alert Lochia:  Uterine Fundus: size = dates Incision:  DVT Evaluation:  Abdomen soft non tender Labs: Lab Results  Component Value Date   WBC 12.3 (H) 12/30/2016   HGB 8.8 (L) 12/30/2016   HCT 24.7 (L) 12/30/2016   MCV 84.3 12/30/2016   PLT 149 (L) 12/30/2016   CMP Latest Ref Rng & Units 12/30/2016  Glucose 65 - 99 mg/dL 92  BUN 6 - 20 mg/dL <7(W<5(L)  Creatinine 2.950.44 - 1.00 mg/dL 6.21(H0.37(L)  Sodium 086135 - 578145 mmol/L 127(L)  Potassium 3.5 - 5.1 mmol/L 3.3(L)  Chloride 101 - 111 mmol/L 98(L)   CO2 22 - 32 mmol/L 21(L)  Calcium 8.9 - 10.3 mg/dL 4.6(N8.5(L)  Total Protein 6.5 - 8.1 g/dL 6.2(L)  Total Bilirubin 0.3 - 1.2 mg/dL 0.5  Alkaline Phos 38 - 126 U/L 38  AST 15 - 41 U/L 23  ALT 14 - 54 U/L 6(L)    Discharge instruction: per After Visit Summary and "Baby and Me Booklet".  After visit meds:  Allergies as of 12/31/2016   No Known Allergies     Medication List    TAKE these medications   ondansetron 4 MG disintegrating tablet Commonly known as:  ZOFRAN ODT Take 1 tablet (4 mg total) by mouth every 8 (eight) hours as needed for nausea or vomiting.   PREPLUS 27-1 MG Tabs Take 1 tablet by mouth daily.   promethazine 25 MG tablet Commonly known as:  PHENERGAN Take 1 tablet (25 mg total) by mouth every 6 (six) hours as needed for nausea or vomiting. What changed:  Another medication with the same name was added. Make sure you understand how and when to take each.   promethazine 25 MG tablet Commonly known as:  PHENERGAN Take 1 tablet (25 mg total) by mouth every 6 (six) hours as needed for nausea. What changed:  You were already taking a medication with the same name, and this prescription was added. Make sure you understand how and when to take each.   terconazole 0.8 % vaginal cream Commonly known as:  TERAZOL 3 Place 1 applicator vaginally at bedtime.       Diet: low fat diest  Activity: Advance as tolerated. Pelvic rest for 6 weeks.   Outpatient follow up:1 week Follow up Appt:Future Appointments Date Time Provider Department Center  01/14/2017 1:45 PM Roe Coombs, CNM [facility] None   Follow up Visit:No Follow-up on file.  Postpartum contraception:   Newborn Data: This patient has no babies on file. Baby Feeding:  Disposition:   12/31/2016 Lazaro Arms, MD

## 2017-01-14 ENCOUNTER — Encounter: Payer: Self-pay | Admitting: Certified Nurse Midwife

## 2017-01-14 ENCOUNTER — Ambulatory Visit (INDEPENDENT_AMBULATORY_CARE_PROVIDER_SITE_OTHER): Payer: Medicaid Other | Admitting: Certified Nurse Midwife

## 2017-01-14 DIAGNOSIS — Z3400 Encounter for supervision of normal first pregnancy, unspecified trimester: Secondary | ICD-10-CM

## 2017-01-14 DIAGNOSIS — Z3402 Encounter for supervision of normal first pregnancy, second trimester: Secondary | ICD-10-CM

## 2017-01-14 NOTE — Progress Notes (Signed)
Pt c/o L shoulder pain x 2-3 wks. 2 gtt due. Pt questions if the baby is vertex.

## 2017-01-14 NOTE — Progress Notes (Signed)
   PRENATAL VISIT NOTE  Subjective:  Kaitlin Montgomery is a 21 y.o. G1P0000 at 641w6d being seen today for ongoing prenatal care.  She is currently monitored for the following issues for this low-risk pregnancy and has Anxiety; Depressed mood; Supervision of normal first teen pregnancy; Nausea and vomiting; Gall bladder polyp; and Elevated amylase on her problem list.  Patient reports no complaints.  Contractions: Not present. Vag. Bleeding: None.  Movement: Present. Denies leaking of fluid.   The following portions of the patient's history were reviewed and updated as appropriate: allergies, current medications, past family history, past medical history, past social history, past surgical history and problem list. Problem list updated.  Objective:   Vitals:   01/14/17 1339  BP: 110/62  Pulse: 100  Weight: 151 lb (68.5 kg)    Fetal Status: Fetal Heart Rate (bpm): 154 Fundal Height: 26 cm Movement: Present     General:  Alert, oriented and cooperative. Patient is in no acute distress.  Skin: Skin is warm and dry. No rash noted.   Cardiovascular: Normal heart rate noted  Respiratory: Normal respiratory effort, no problems with respiration noted  Abdomen: Soft, gravid, appropriate for gestational age. Pain/Pressure: Present     Pelvic:  Cervical exam deferred        Extremities: Normal range of motion.  Edema: None  Mental Status: Normal mood and affect. Normal behavior. Normal judgment and thought content.   Assessment and Plan:  Pregnancy: G1P0000 at 5341w6d  1. Encounter for supervision of normal pregnancy in teen primigravida, antepartum     Doing well.    Preterm labor symptoms and general obstetric precautions including but not limited to vaginal bleeding, contractions, leaking of fluid and fetal movement were reviewed in detail with the patient. Please refer to After Visit Summary for other counseling recommendations.  Return in about 2 weeks (around 01/28/2017) for ROB, GTT,  CBC, HIV, RPR.   Lynnell JudeJade C Markus Casten, FNP

## 2017-01-29 ENCOUNTER — Encounter: Payer: Medicaid Other | Admitting: Certified Nurse Midwife

## 2017-02-01 ENCOUNTER — Encounter: Payer: Self-pay | Admitting: Certified Nurse Midwife

## 2017-02-01 ENCOUNTER — Ambulatory Visit (INDEPENDENT_AMBULATORY_CARE_PROVIDER_SITE_OTHER): Payer: Medicaid Other | Admitting: Certified Nurse Midwife

## 2017-02-01 ENCOUNTER — Other Ambulatory Visit: Payer: Medicaid Other

## 2017-02-01 DIAGNOSIS — J40 Bronchitis, not specified as acute or chronic: Secondary | ICD-10-CM | POA: Insufficient documentation

## 2017-02-01 DIAGNOSIS — Z3403 Encounter for supervision of normal first pregnancy, third trimester: Secondary | ICD-10-CM

## 2017-02-01 DIAGNOSIS — Z3400 Encounter for supervision of normal first pregnancy, unspecified trimester: Secondary | ICD-10-CM

## 2017-02-01 NOTE — Progress Notes (Signed)
   PRENATAL VISIT NOTE  Subjective:  Kaitlin Montgomery is a 21 y.o. G1P0000 at 7535w3d being seen today for ongoing prenatal care.  She is currently monitored for the following issues for this low-risk pregnancy and has Anxiety; Depressed mood; Supervision of normal first teen pregnancy; Nausea and vomiting; Gall bladder polyp; and Elevated amylase on her problem list.  Patient reports backache, no bleeding, no contractions, no cramping, no leaking and roung ligament pain, mosly left sided.  Contractions: Irritability. Vag. Bleeding: None.  Movement: Present. Denies leaking of fluid.   The following portions of the patient's history were reviewed and updated as appropriate: allergies, current medications, past family history, past medical history, past social history, past surgical history and problem list. Problem list updated.  Objective:   Vitals:   02/01/17 0855  BP: 104/65  Pulse: 82  Temp: 97.7 F (36.5 C)  Weight: 154 lb (69.9 kg)    Fetal Status: Fetal Heart Rate (bpm): 140 Fundal Height: 29 cm Movement: Present     General:  Alert, oriented and cooperative. Patient is in no acute distress.  Skin: Skin is warm and dry. No rash noted.   Cardiovascular: Normal heart rate noted  Respiratory: Normal respiratory effort, no problems with respiration noted  Abdomen: Soft, gravid, appropriate for gestational age. Pain/Pressure: Absent     Pelvic:  Cervical exam deferred        Extremities: Normal range of motion.  Edema: Trace  Mental Status: Normal mood and affect. Normal behavior. Normal judgment and thought content.   Assessment and Plan:  Pregnancy: G1P0000 at 6335w3d  1. Encounter for supervision of normal pregnancy in teen primigravida, antepartum     Discussed comfort measures for round ligament pain.  TDaP discussed.        If round ligament pain not improved will consider abdominal support belt.        2 hour OGTT today.   Preterm labor symptoms and general obstetric  precautions including but not limited to vaginal bleeding, contractions, leaking of fluid and fetal movement were reviewed in detail with the patient. Please refer to After Visit Summary for other counseling recommendations.  Return in about 2 weeks (around 02/15/2017) for ROB, TDaP.   Roe Coombsachelle A Atianna Haidar, CNM

## 2017-02-01 NOTE — Addendum Note (Signed)
Addended by: Natale MilchSTALLING, Galen Malkowski D on: 02/01/2017 10:10 AM   Modules accepted: Orders

## 2017-02-01 NOTE — Progress Notes (Signed)
Patient reports good fetal movement, complains of occassionally feeling a sharp pain on left side.

## 2017-02-02 LAB — CBC
HEMOGLOBIN: 8.7 g/dL — AB (ref 11.1–15.9)
Hematocrit: 25.6 % — ABNORMAL LOW (ref 34.0–46.6)
MCH: 29.8 pg (ref 26.6–33.0)
MCHC: 34 g/dL (ref 31.5–35.7)
MCV: 88 fL (ref 79–97)
PLATELETS: 148 10*3/uL — AB (ref 150–379)
RBC: 2.92 x10E6/uL — ABNORMAL LOW (ref 3.77–5.28)
RDW: 13.2 % (ref 12.3–15.4)
WBC: 9.5 10*3/uL (ref 3.4–10.8)

## 2017-02-02 LAB — GLUCOSE TOLERANCE, 2 HOURS W/ 1HR
GLUCOSE, 1 HOUR: 89 mg/dL (ref 65–179)
GLUCOSE, 2 HOUR: 97 mg/dL (ref 65–152)
GLUCOSE, FASTING: 72 mg/dL (ref 65–91)

## 2017-02-02 LAB — RPR: RPR: NONREACTIVE

## 2017-02-02 LAB — HIV ANTIBODY (ROUTINE TESTING W REFLEX): HIV SCREEN 4TH GENERATION: NONREACTIVE

## 2017-02-04 ENCOUNTER — Other Ambulatory Visit: Payer: Self-pay | Admitting: Certified Nurse Midwife

## 2017-02-04 DIAGNOSIS — Z3403 Encounter for supervision of normal first pregnancy, third trimester: Secondary | ICD-10-CM

## 2017-02-04 DIAGNOSIS — O99019 Anemia complicating pregnancy, unspecified trimester: Secondary | ICD-10-CM | POA: Insufficient documentation

## 2017-02-04 DIAGNOSIS — O99013 Anemia complicating pregnancy, third trimester: Secondary | ICD-10-CM

## 2017-02-08 ENCOUNTER — Other Ambulatory Visit: Payer: Self-pay | Admitting: Certified Nurse Midwife

## 2017-02-09 LAB — FERRITIN: Ferritin: 45 ng/mL (ref 15–77)

## 2017-02-09 LAB — IRON AND TIBC
IRON: 65 ug/dL (ref 27–159)
Iron Saturation: 20 % (ref 15–55)
TIBC: 326 ug/dL (ref 250–450)
UIBC: 261 ug/dL (ref 131–425)

## 2017-02-09 LAB — SPECIMEN STATUS REPORT

## 2017-02-11 ENCOUNTER — Other Ambulatory Visit: Payer: Self-pay | Admitting: Certified Nurse Midwife

## 2017-02-11 DIAGNOSIS — O99013 Anemia complicating pregnancy, third trimester: Secondary | ICD-10-CM

## 2017-02-11 MED ORDER — CITRANATAL BLOOM 90-1 MG PO TABS: 1.0000 | ORAL_TABLET | Freq: Every day | ORAL | 12 refills | Status: DC

## 2017-02-15 ENCOUNTER — Ambulatory Visit (INDEPENDENT_AMBULATORY_CARE_PROVIDER_SITE_OTHER): Payer: Medicaid Other | Admitting: Certified Nurse Midwife

## 2017-02-15 ENCOUNTER — Encounter: Payer: Self-pay | Admitting: Certified Nurse Midwife

## 2017-02-15 VITALS — BP 109/67 | HR 106 | Wt 154.8 lb

## 2017-02-15 DIAGNOSIS — Z23 Encounter for immunization: Secondary | ICD-10-CM | POA: Diagnosis not present

## 2017-02-15 DIAGNOSIS — K824 Cholesterolosis of gallbladder: Secondary | ICD-10-CM

## 2017-02-15 DIAGNOSIS — O99613 Diseases of the digestive system complicating pregnancy, third trimester: Secondary | ICD-10-CM

## 2017-02-15 DIAGNOSIS — Z3403 Encounter for supervision of normal first pregnancy, third trimester: Secondary | ICD-10-CM

## 2017-02-15 NOTE — Progress Notes (Signed)
   PRENATAL VISIT NOTE  Subjective:  Kaitlin Montgomery is a 21 y.o. G1P0000 at [redacted]w[redacted]d being seen today for ongoing prenatal care.  She is currently monitored for the following issues for this low-risk pregnancy and has Anxiety; Depressed mood; Supervision of normal first teen pregnancy; Gall bladder polyp; Elevated amylase; and Anemia in pregnancy on her problem list.  Patient reports no complaints.  Contractions: Not present. Vag. Bleeding: None.  Movement: Present. Denies leaking of fluid.   The following portions of the patient's history were reviewed and updated as appropriate: allergies, current medications, past family history, past medical history, past social history, past surgical history and problem list. Problem list updated.  Objective:   Vitals:   02/15/17 1105  BP: 109/67  Pulse: (!) 106  Weight: 154 lb 12.8 oz (70.2 kg)    Fetal Status: Fetal Heart Rate (bpm): 141 Fundal Height: 31 cm Movement: Present     General:  Alert, oriented and cooperative. Patient is in no acute distress.  Skin: Skin is warm and dry. No rash noted.   Cardiovascular: Normal heart rate noted  Respiratory: Normal respiratory effort, no problems with respiration noted  Abdomen: Soft, gravid, appropriate for gestational age. Pain/Pressure: Present     Pelvic:  Cervical exam deferred        Extremities: Normal range of motion.  Edema: None  Mental Status: Normal mood and affect. Normal behavior. Normal judgment and thought content.   Assessment and Plan:  Pregnancy: G1P0000 at [redacted]w[redacted]d  1. Supervision of normal first teen pregnancy in third trimester      Doing well  2. Gall bladder polyp      - Hepatic function panel - Bile acids, total  Preterm labor symptoms and general obstetric precautions including but not limited to vaginal bleeding, contractions, leaking of fluid and fetal movement were reviewed in detail with the patient. Please refer to After Visit Summary for other counseling  recommendations.  Return in about 2 weeks (around 03/01/2017) for ROB.   Roe Coombs, CNM

## 2017-02-15 NOTE — Progress Notes (Signed)
Pt presents for ROB. Tdap given today R del w/o complaints. Pt has concerns baby is going to be premature.

## 2017-03-01 ENCOUNTER — Ambulatory Visit (INDEPENDENT_AMBULATORY_CARE_PROVIDER_SITE_OTHER): Payer: Medicaid Other | Admitting: Certified Nurse Midwife

## 2017-03-01 ENCOUNTER — Encounter: Payer: Self-pay | Admitting: Certified Nurse Midwife

## 2017-03-01 ENCOUNTER — Other Ambulatory Visit: Payer: Self-pay | Admitting: Certified Nurse Midwife

## 2017-03-01 VITALS — BP 101/64 | HR 106 | Wt 159.1 lb

## 2017-03-01 DIAGNOSIS — O99013 Anemia complicating pregnancy, third trimester: Secondary | ICD-10-CM

## 2017-03-01 DIAGNOSIS — O99613 Diseases of the digestive system complicating pregnancy, third trimester: Secondary | ICD-10-CM

## 2017-03-01 DIAGNOSIS — K824 Cholesterolosis of gallbladder: Secondary | ICD-10-CM

## 2017-03-01 DIAGNOSIS — O26843 Uterine size-date discrepancy, third trimester: Secondary | ICD-10-CM

## 2017-03-01 DIAGNOSIS — F329 Major depressive disorder, single episode, unspecified: Secondary | ICD-10-CM

## 2017-03-01 DIAGNOSIS — Z3403 Encounter for supervision of normal first pregnancy, third trimester: Secondary | ICD-10-CM

## 2017-03-01 DIAGNOSIS — O2613 Low weight gain in pregnancy, third trimester: Secondary | ICD-10-CM

## 2017-03-01 DIAGNOSIS — R4589 Other symptoms and signs involving emotional state: Secondary | ICD-10-CM

## 2017-03-01 DIAGNOSIS — D649 Anemia, unspecified: Secondary | ICD-10-CM

## 2017-03-01 DIAGNOSIS — K219 Gastro-esophageal reflux disease without esophagitis: Secondary | ICD-10-CM

## 2017-03-01 MED ORDER — OMEPRAZOLE 20 MG PO CPDR: 20.0000 mg | DELAYED_RELEASE_CAPSULE | Freq: Two times a day (BID) | ORAL | 5 refills | Status: DC

## 2017-03-01 NOTE — Progress Notes (Signed)
   PRENATAL VISIT NOTE  Subjective:  Kaitlin Montgomery is a 21 y.o. G1P0000 at [redacted]w[redacted]d being seen today for ongoing prenatal care.  She is currently monitored for the following issues for this low-risk pregnancy and has Anxiety; Depressed mood; Supervision of normal first teen pregnancy; Gall bladder polyp; Elevated amylase; and Anemia in pregnancy on her problem list.  Patient reports no complaints.  Contractions: Not present. Vag. Bleeding: None.  Movement: Present. Denies leaking of fluid.   The following portions of the patient's history were reviewed and updated as appropriate: allergies, current medications, past family history, past medical history, past social history, past surgical history and problem list. Problem list updated.  Objective:   Vitals:   03/01/17 1426  BP: 101/64  Pulse: (!) 106  Weight: 159 lb 1.6 oz (72.2 kg)    Fetal Status: Fetal Heart Rate (bpm): 140 Fundal Height: 30 cm Movement: Present     General:  Alert, oriented and cooperative. Patient is in no acute distress.  Skin: Skin is warm and dry. No rash noted.   Cardiovascular: Normal heart rate noted  Respiratory: Normal respiratory effort, no problems with respiration noted  Abdomen: Soft, gravid, appropriate for gestational age. Pain/Pressure: Absent     Pelvic:  Cervical exam deferred        Extremities: Normal range of motion.  Edema: None  Mental Status: Normal mood and affect. Normal behavior. Normal judgment and thought content.   Assessment and Plan:  Pregnancy: G1P0000 at [redacted]w[redacted]d  1. Supervision of normal first teen pregnancy in third trimester      S<D  2. Depressed mood     Not on medication  3. Anemia during pregnancy in third trimester      F/U CBC today - CBC  4. Gall bladder polyp     Bile acids and LFTs today.  No complaints today.   5. Uterine size date discrepancy pregnancy, third trimester       - [place] MFM OB FOLLOW UP; Future  6. Low maternal weight gain, third trimester    6lb weight gain this pregnancy - [place] MFM OB FOLLOW UP; Future  Preterm labor symptoms and general obstetric precautions including but not limited to vaginal bleeding, contractions, leaking of fluid and fetal movement were reviewed in detail with the patient. Please refer to After Visit Summary for other counseling recommendations.  Return in about 2 weeks (around 03/15/2017) for ROB, GBS.   Roe Coombs, CNM

## 2017-03-01 NOTE — Progress Notes (Signed)
Patient is in the office, reports good fetal movement and ocassional heartburn.

## 2017-03-01 NOTE — Addendum Note (Signed)
Addended by: Samantha Crimes on: 03/01/2017 03:12 PM   Modules accepted: Orders

## 2017-03-02 LAB — CBC
Hematocrit: 25.8 % — ABNORMAL LOW (ref 34.0–46.6)
Hemoglobin: 8.9 g/dL — ABNORMAL LOW (ref 11.1–15.9)
MCH: 30.5 pg (ref 26.6–33.0)
MCHC: 34.5 g/dL (ref 31.5–35.7)
MCV: 88 fL (ref 79–97)
PLATELETS: 144 10*3/uL — AB (ref 150–379)
RBC: 2.92 x10E6/uL — ABNORMAL LOW (ref 3.77–5.28)
RDW: 13.4 % (ref 12.3–15.4)
WBC: 9 10*3/uL (ref 3.4–10.8)

## 2017-03-03 ENCOUNTER — Other Ambulatory Visit: Payer: Self-pay | Admitting: Certified Nurse Midwife

## 2017-03-03 LAB — HEPATIC FUNCTION PANEL
ALBUMIN: 4 g/dL (ref 3.5–5.5)
ALK PHOS: 77 IU/L (ref 39–117)
ALT: 4 IU/L (ref 0–32)
AST: 13 IU/L (ref 0–40)
Bilirubin Total: 0.2 mg/dL (ref 0.0–1.2)
Bilirubin, Direct: 0.09 mg/dL (ref 0.00–0.40)
TOTAL PROTEIN: 6.5 g/dL (ref 6.0–8.5)

## 2017-03-03 LAB — BILE ACIDS, TOTAL: Bile Acids Total: 12.6 umol/L (ref 4.7–24.5)

## 2017-03-15 ENCOUNTER — Other Ambulatory Visit (HOSPITAL_COMMUNITY)
Admission: RE | Admit: 2017-03-15 | Discharge: 2017-03-15 | Disposition: A | Discharge status: Home / Self Care | Payer: Medicaid Other | Source: Ambulatory Visit | Attending: Certified Nurse Midwife | Admitting: Certified Nurse Midwife

## 2017-03-15 ENCOUNTER — Ambulatory Visit (INDEPENDENT_AMBULATORY_CARE_PROVIDER_SITE_OTHER): Payer: Medicaid Other | Admitting: Certified Nurse Midwife

## 2017-03-15 VITALS — BP 116/76 | HR 109 | Wt 160.1 lb

## 2017-03-15 DIAGNOSIS — Z3403 Encounter for supervision of normal first pregnancy, third trimester: Secondary | ICD-10-CM | POA: Insufficient documentation

## 2017-03-15 DIAGNOSIS — Z3A00 Weeks of gestation of pregnancy not specified: Secondary | ICD-10-CM | POA: Insufficient documentation

## 2017-03-15 LAB — OB RESULTS CONSOLE GC/CHLAMYDIA: Gonorrhea: NEGATIVE

## 2017-03-15 NOTE — Progress Notes (Signed)
Patient reports good fetal movement, denies pain. Patient reports having leg cramps.

## 2017-03-15 NOTE — Progress Notes (Signed)
   PRENATAL VISIT NOTE  Subjective:  Kaitlin Montgomery is a 21 y.o. G1P0000 at [redacted]w[redacted]d being seen today for ongoing prenatal care.  She is currently monitored for the following issues for this low-risk pregnancy and has Anxiety; Depressed mood; Supervision of normal first teen pregnancy; Gall bladder polyp; Elevated amylase; and Anemia in pregnancy on her problem list.  Patient reports no complaints.  Contractions: Not present. Vag. Bleeding: None.  Movement: Present. Denies leaking of fluid.   The following portions of the patient's history were reviewed and updated as appropriate: allergies, current medications, past family history, past medical history, past social history, past surgical history and problem list. Problem list updated.  Objective:   Vitals:   03/15/17 1443  BP: 116/76  Pulse: (!) 109  Weight: 160 lb 1.6 oz (72.6 kg)    Fetal Status: Fetal Heart Rate (bpm): 158 Fundal Height: 31 cm Movement: Present  Presentation: Vertex  General:  Alert, oriented and cooperative. Patient is in no acute distress.  Skin: Skin is warm and dry. No rash noted.   Cardiovascular: Normal heart rate noted  Respiratory: Normal respiratory effort, no problems with respiration noted  Abdomen: Soft, gravid, appropriate for gestational age. Pain/Pressure: Absent     Pelvic:  Cervical exam performed Dilation: 1 Effacement (%): 50 Station: -3  Extremities: Normal range of motion.  Edema: None  Mental Status: Normal mood and affect. Normal behavior. Normal judgment and thought content.   Assessment and Plan:  Pregnancy: G1P0000 at 241w3d  1. Supervision of normal first teen pregnancy in third trimester      Doing well.  Peds list given.  Discussed trimming pubic hair versus waxing.  - Cervicovaginal ancillary only - Strep Gp B NAA  Preterm labor symptoms and general obstetric precautions including but not limited to vaginal bleeding, contractions, leaking of fluid and fetal movement were reviewed  in detail with the patient. Please refer to After Visit Summary for other counseling recommendations.  Return in about 1 week (around 03/22/2017) for ROB.   Roe Coombsenney, Gilverto Dileonardo A, CNM

## 2017-03-15 NOTE — Patient Instructions (Signed)
AREA PEDIATRIC/FAMILY PRACTICE PHYSICIANS  [facility] [address], [address] [place], [place]  [address] Phone - [phone]   Fax - [phone]  [facility] [address] [place], [place] [address] Phone - [phone]   Fax - [phone]  J[facility] AM[facility] [address] [place], [place]  [address] Phone - [phone]   Fax - [phone]  [facility] [address] [place], [place]  [address] Phone - [phone]   Fax - [phone]  [facility] [address] [place], [place]  [address] Phone - [phone]   Fax - [phone]  [facility] [address], [address] [place], [place]  [address] Phone - [phone]   Fax - [phone]  [facility] [address] [place], [place]  [address] Phone - [phone]   Fax - [phone]  [facility] [address] [place], [place]  [address] Phone - [phone]   Fax - [phone]  [facility] [address] [place], [place]  [address] Phone - [phone]   Fax - [phone] [facility] [address] [place], [place]  [address] Phone - [phone]   Fax - [phone]  [facility] [address] [place], [place]  [address] Phone - [phone]   Fax - [phone]  [facility] [address] [place], [place]  [address] Phone - [phone]   Fax - [phone]  [facility] [address], [address] [place], [place]  [address] Phone - [phone]   Fax - [phone]  SHILPA GOSRANI [address] [place], [place]  [address] Phone - [phone]  [facility] [address] [place], [place]  [address] Phone - [phone]   Fax - [phone]  [facility] [address] [place], [place]  [address] Phone - [phone]   Fax - [phone]  [facility] [address] [place], [place]  [address] Phone - [phone]   Fax - [phone]  [facility] [address] [place], [place]  [address] Phone - [phone]   Fax - [phone]   [facility] [address] [place], [place]  [address] Phone - [phone]   Fax - [phone]  [facility] [address] [place], [place]  [address] Phone - [phone]   Fax - [phone]  DAVID RUBIN [address] [place], [place]  [address] Phone - [phone]   Fax - [phone]  [facility] [address] [place], [place]  [address] Phone - [phone]   Fax - [phone]  [facility] [address] , [place]  [address] Phone - [phone]   Fax - [phone] [facility] [address] [place], [place]  [address] Phone - [phone]   Fax - [phone]  [facility] [address] [place], [place]  [address] Phone - [phone]   Fax - [phone]  [facility] [address] , [place]  [address] Phone - [phone]   Fax - [phone]  [facility] Charlene Flemming MD [address] [place]  [address] Phone [phone]  Fax [phone]   

## 2017-03-17 LAB — CERVICOVAGINAL ANCILLARY ONLY
BACTERIAL VAGINITIS: NEGATIVE
CANDIDA VAGINITIS: NEGATIVE
CHLAMYDIA, DNA PROBE: NEGATIVE
NEISSERIA GONORRHEA: NEGATIVE
TRICH (WINDOWPATH): NEGATIVE

## 2017-03-17 LAB — STREP GP B NAA: Strep Gp B NAA: POSITIVE — AB

## 2017-03-21 ENCOUNTER — Other Ambulatory Visit: Payer: Self-pay | Admitting: Certified Nurse Midwife

## 2017-03-21 DIAGNOSIS — B951 Streptococcus, group B, as the cause of diseases classified elsewhere: Secondary | ICD-10-CM | POA: Insufficient documentation

## 2017-03-21 DIAGNOSIS — Z3403 Encounter for supervision of normal first pregnancy, third trimester: Secondary | ICD-10-CM

## 2017-03-22 ENCOUNTER — Ambulatory Visit (INDEPENDENT_AMBULATORY_CARE_PROVIDER_SITE_OTHER): Payer: Medicaid Other | Admitting: Certified Nurse Midwife

## 2017-03-22 ENCOUNTER — Encounter: Payer: Self-pay | Admitting: Certified Nurse Midwife

## 2017-03-22 VITALS — BP 104/63 | HR 80 | Wt 160.3 lb

## 2017-03-22 DIAGNOSIS — Z3403 Encounter for supervision of normal first pregnancy, third trimester: Secondary | ICD-10-CM

## 2017-03-22 DIAGNOSIS — B951 Streptococcus, group B, as the cause of diseases classified elsewhere: Secondary | ICD-10-CM

## 2017-03-22 NOTE — Progress Notes (Signed)
Patient is in the office, reports good fetal movement and irregular contractions.

## 2017-03-22 NOTE — Progress Notes (Signed)
   PRENATAL VISIT NOTE  Subjective:  Kaitlin Montgomery is a 21 y.o. G1P0000 at 4371w3d being seen today for ongoing prenatal care.  She is currently monitored for the following issues for this low-risk pregnancy and has Anxiety; Depressed mood; Supervision of normal first teen pregnancy; Gall bladder polyp; Elevated amylase; Anemia in pregnancy; and Positive GBS test on her problem list.  Patient reports no complaints.  Contractions: Irregular. Vag. Bleeding: None.  Movement: Present. Denies leaking of fluid.   The following portions of the patient's history were reviewed and updated as appropriate: allergies, current medications, past family history, past medical history, past social history, past surgical history and problem list. Problem list updated.  Objective:   Vitals:   03/22/17 1028  BP: 104/63  Pulse: 80  Weight: 160 lb 4.8 oz (72.7 kg)    Fetal Status: Fetal Heart Rate (bpm): 136 Fundal Height: 33 cm Movement: Present     General:  Alert, oriented and cooperative. Patient is in no acute distress.  Skin: Skin is warm and dry. No rash noted.   Cardiovascular: Normal heart rate noted  Respiratory: Normal respiratory effort, no problems with respiration noted  Abdomen: Soft, gravid, appropriate for gestational age. Pain/Pressure: Absent     Pelvic:  Cervical exam deferred        Extremities: Normal range of motion.  Edema: None  Mental Status: Normal mood and affect. Normal behavior. Normal judgment and thought content.   Assessment and Plan:  Pregnancy: G1P0000 at 6971w3d  1. Supervision of normal first teen pregnancy in third trimester     Doing well  2. Positive GBS test     PCN for labor/delivery.   Preterm labor symptoms and general obstetric precautions including but not limited to vaginal bleeding, contractions, leaking of fluid and fetal movement were reviewed in detail with the patient. Please refer to After Visit Summary for other counseling recommendations.    Return in about 1 week (around 03/29/2017) for ROB.   Roe Coombsenney, Jeziel Hoffmann A, CNM

## 2017-03-29 ENCOUNTER — Other Ambulatory Visit: Payer: Self-pay | Admitting: Certified Nurse Midwife

## 2017-03-29 ENCOUNTER — Ambulatory Visit (INDEPENDENT_AMBULATORY_CARE_PROVIDER_SITE_OTHER): Payer: Medicaid Other | Admitting: Certified Nurse Midwife

## 2017-03-29 ENCOUNTER — Ambulatory Visit (HOSPITAL_COMMUNITY)
Admission: RE | Admit: 2017-03-29 | Discharge: 2017-03-29 | Disposition: A | Discharge status: Home / Self Care | Payer: Medicaid Other | Source: Ambulatory Visit | Attending: Certified Nurse Midwife | Admitting: Certified Nurse Midwife

## 2017-03-29 ENCOUNTER — Encounter: Payer: Self-pay | Admitting: Certified Nurse Midwife

## 2017-03-29 VITALS — BP 102/67 | HR 84

## 2017-03-29 DIAGNOSIS — O09893 Supervision of other high risk pregnancies, third trimester: Secondary | ICD-10-CM | POA: Insufficient documentation

## 2017-03-29 DIAGNOSIS — Z362 Encounter for other antenatal screening follow-up: Secondary | ICD-10-CM | POA: Insufficient documentation

## 2017-03-29 DIAGNOSIS — O26843 Uterine size-date discrepancy, third trimester: Secondary | ICD-10-CM | POA: Diagnosis present

## 2017-03-29 DIAGNOSIS — O2613 Low weight gain in pregnancy, third trimester: Secondary | ICD-10-CM | POA: Diagnosis present

## 2017-03-29 DIAGNOSIS — B951 Streptococcus, group B, as the cause of diseases classified elsewhere: Secondary | ICD-10-CM

## 2017-03-29 DIAGNOSIS — Z3A37 37 weeks gestation of pregnancy: Secondary | ICD-10-CM

## 2017-03-29 DIAGNOSIS — O99013 Anemia complicating pregnancy, third trimester: Secondary | ICD-10-CM

## 2017-03-29 DIAGNOSIS — Z3403 Encounter for supervision of normal first pregnancy, third trimester: Secondary | ICD-10-CM

## 2017-03-29 NOTE — Patient Instructions (Signed)
AREA PEDIATRIC/FAMILY PRACTICE PHYSICIANS  [facility] [address] [place], [place]  [address] Phone - [phone]   Fax - [phone]  [facility] [address] [place], [place] [address] Phone - [phone]   Fax - [phone]  J[facility] AM[facility] [address] [place], [place]  [address] Phone - [phone]   Fax - [phone]  [facility] [address] [place], [place]  [address] Phone - [phone]   Fax - [phone]  [facility] [address] [place], [place]  [address] Phone - [phone]   Fax - [phone]  [facility] [address] [place], [place]  [address] Phone - [phone]   Fax - [phone]  [facility] [address] [place], [place]  [address] Phone - [phone]   Fax - [phone]  [facility] [address] [place], [place]  [address] Phone - [phone]   Fax - [phone]  [facility] [address] [place], [place]  [address] Phone - [phone]   Fax - [phone] [facility] [address] [place], [place]  [address] Phone - [phone]   Fax - [phone]  [facility] [address] [place], [place]  [address] Phone - [phone]   Fax - [phone]  [facility] [address] [place], [place]  [address] Phone - [phone]   Fax - [phone]  [facility] [address] [place], [place]  [address] Phone - [phone]   Fax - [phone]  [facility] GOSRANI [address] [place], [place]  [address] Phone - [phone]  [facility] [address] [place], [place]  [address] Phone - [phone]   Fax - [phone]  [facility] [address] [place], [place]  [address] Phone - [phone]   Fax - [phone]  [facility] [address] [place], [place]  [address] Phone - [phone]   Fax - [phone]  [facility] [address] [place], [place]  [address] Phone - [phone]   Fax - [phone]   [facility] [address] [place], [place]  [address] Phone - [phone]   Fax - [phone]  [facility] [address] [place], [place]  [address] Phone - [phone]   Fax - [phone]  [facility] [address] [place], [place]  [address] Phone - [phone]   Fax - [phone]  [facility] [address] [place], [place]  [address] Phone - [phone]   Fax - [phone]  [facility] [address] [place], [place]  [address] Phone - [phone]   Fax - [phone] [facility] [address] [place], [place]  [address] Phone - [phone]   Fax - [phone]  [facility] [address] [place], [place]  [address] Phone - [phone]   Fax - [phone]   [facility] [address] [place], [place]  [address] Phone - [phone]   Fax - [phone]  [facility] [place] [facility] Charlene Flemming MD [address] [place] [address] Phone [phone]  Fax [phone]   

## 2017-03-29 NOTE — Progress Notes (Signed)
   PRENATAL VISIT NOTE  Subjective:  Kaitlin Montgomery is a 21 y.o. G1P0000 at 5765w3d being seen today for ongoing prenatal care.  She is currently monitored for the following issues for this low-risk pregnancy and has Anxiety; Depressed mood; Supervision of normal first teen pregnancy; Gall bladder polyp; Elevated amylase; Anemia in pregnancy; and Positive GBS test on her problem list.  Patient reports no complaints.  Contractions: Irregular. Vag. Bleeding: None.  Movement: Present. Denies leaking of fluid.   The following portions of the patient's history were reviewed and updated as appropriate: allergies, current medications, past family history, past medical history, past social history, past surgical history and problem list. Problem list updated.  Objective:   Vitals:   03/29/17 1010  BP: 102/67  Pulse: 84    Fetal Status: Fetal Heart Rate (bpm): 144 Fundal Height: 34 cm Movement: Present  Presentation: Vertex  General:  Alert, oriented and cooperative. Patient is in no acute distress.  Skin: Skin is warm and dry. No rash noted.   Cardiovascular: Normal heart rate noted  Respiratory: Normal respiratory effort, no problems with respiration noted  Abdomen: Soft, gravid, appropriate for gestational age. Pain/Pressure: Absent     Pelvic:  Cervical exam performed Dilation: 1.5 Effacement (%): 80 Station: -2  Extremities: Normal range of motion.     Mental Status: Normal mood and affect. Normal behavior. Normal judgment and thought content.   Assessment and Plan:  Pregnancy: G1P0000 at 2665w3d  1. Supervision of normal first teen pregnancy in third trimester     Doing well. Labor s/s reviewed.   2. Anemia during pregnancy in third trimester      Taking Bloom  3. Positive GBS test     PCN for labor/delivery  Term labor symptoms and general obstetric precautions including but not limited to vaginal bleeding, contractions, leaking of fluid and fetal movement were reviewed in detail  with the patient. Please refer to After Visit Summary for other counseling recommendations.  Return in about 1 week (around 04/05/2017) for ROB.   Roe Coombsachelle A Nuria Phebus, CNM

## 2017-04-06 ENCOUNTER — Ambulatory Visit (INDEPENDENT_AMBULATORY_CARE_PROVIDER_SITE_OTHER): Payer: Medicaid Other | Admitting: Certified Nurse Midwife

## 2017-04-06 VITALS — BP 102/66 | HR 83 | Wt 164.0 lb

## 2017-04-06 DIAGNOSIS — B951 Streptococcus, group B, as the cause of diseases classified elsewhere: Secondary | ICD-10-CM

## 2017-04-06 DIAGNOSIS — Z3403 Encounter for supervision of normal first pregnancy, third trimester: Secondary | ICD-10-CM

## 2017-04-06 NOTE — Progress Notes (Signed)
   PRENATAL VISIT NOTE  Subjective:  Kaitlin Montgomery is a 21 y.o. G1P0000 at 4291w4d being seen today for ongoing prenatal care.  She is currently monitored for the following issues for this low-risk pregnancy and has Anxiety; Depressed mood; Supervision of normal first teen pregnancy; Gall bladder polyp; Elevated amylase; Anemia in pregnancy; and Positive GBS test on her problem list.  Patient reports no complaints.  Contractions: Not present. Vag. Bleeding: None.  Movement: Present. Denies leaking of fluid.   The following portions of the patient's history were reviewed and updated as appropriate: allergies, current medications, past family history, past medical history, past social history, past surgical history and problem list. Problem list updated.  Objective:   Vitals:   04/06/17 1313  BP: 102/66  Pulse: 83  Weight: 164 lb (74.4 kg)    Fetal Status: Fetal Heart Rate (bpm): 145 Fundal Height: 35 cm Movement: Present     General:  Alert, oriented and cooperative. Patient is in no acute distress.  Skin: Skin is warm and dry. No rash noted.   Cardiovascular: Normal heart rate noted  Respiratory: Normal respiratory effort, no problems with respiration noted  Abdomen: Soft, gravid, appropriate for gestational age. Pain/Pressure: Absent     Pelvic:  Cervical exam deferred        Extremities: Normal range of motion.     Mental Status: Normal mood and affect. Normal behavior. Normal judgment and thought content.   Assessment and Plan:  Pregnancy: G1P0000 at 1491w4d  1. Supervision of normal first teen pregnancy in third trimester      Doing well  2. Positive GBS test      PCN for labor/delivery  Term labor symptoms and general obstetric precautions including but not limited to vaginal bleeding, contractions, leaking of fluid and fetal movement were reviewed in detail with the patient. Please refer to After Visit Summary for other counseling recommendations.  Return in about 1 week  (around 04/13/2017) for ROB.   Roe Coombsachelle A Kenith Trickel, CNM

## 2017-04-12 ENCOUNTER — Encounter: Payer: Self-pay | Admitting: Certified Nurse Midwife

## 2017-04-12 ENCOUNTER — Ambulatory Visit (INDEPENDENT_AMBULATORY_CARE_PROVIDER_SITE_OTHER): Payer: Medicaid Other | Admitting: Certified Nurse Midwife

## 2017-04-12 VITALS — BP 109/66 | HR 91 | Wt 164.8 lb

## 2017-04-12 DIAGNOSIS — B951 Streptococcus, group B, as the cause of diseases classified elsewhere: Secondary | ICD-10-CM

## 2017-04-12 DIAGNOSIS — Z3403 Encounter for supervision of normal first pregnancy, third trimester: Secondary | ICD-10-CM

## 2017-04-12 DIAGNOSIS — D649 Anemia, unspecified: Secondary | ICD-10-CM

## 2017-04-12 DIAGNOSIS — O99013 Anemia complicating pregnancy, third trimester: Secondary | ICD-10-CM

## 2017-04-12 DIAGNOSIS — K219 Gastro-esophageal reflux disease without esophagitis: Secondary | ICD-10-CM

## 2017-04-12 DIAGNOSIS — O99613 Diseases of the digestive system complicating pregnancy, third trimester: Secondary | ICD-10-CM

## 2017-04-12 MED ORDER — OMEPRAZOLE 20 MG PO CPDR: 20.0000 mg | DELAYED_RELEASE_CAPSULE | Freq: Two times a day (BID) | ORAL | 5 refills | Status: DC

## 2017-04-12 NOTE — Progress Notes (Signed)
Patient is ready to have her baby. 

## 2017-04-12 NOTE — Patient Instructions (Signed)
AREA PEDIATRIC/FAMILY PRACTICE PHYSICIANS   [facility] [address] [place], [place]  [address] Phone - [phone]   Fax - [phone]  [facility] [address] [place], [place] [address] Phone - [phone]   Fax - [phone]  JACK AM[facility] [address] [place], [place]  [address] Phone - [phone]   Fax - [phone]  [facility] [address] [place], [place]  [address] Phone - [phone]   Fax - [phone]  [facility] [address] [place], [place]  [address] Phone - [phone]   Fax - [phone]  [facility] [address] [place], [place]  [address] Phone - [phone]   Fax - [phone]  [facility] [address] [place], [place]  [address] Phone - [phone]   Fax - [phone]  [facility] [address] [place], [place]  [address] Phone - [phone]   Fax - [phone]  [facility] [address] [place], [place]  [address] Phone - [phone]   Fax - [phone] [facility] [address] [place], [place]  [address] Phone - [phone]   Fax - [phone]  [facility] [address] [place], [place]  [address] Phone - [phone]   Fax - [phone]  [facility] [address] [place], [place]  [address] Phone - [phone]   Fax - [phone]  [facility] [address], [address] [place], [place]  [address] Phone - [phone]   Fax - [phone]  SHIL[facility] GOSRANI [address] [place], [place]  [address] Phone - [phone]  [facility] [address] [place], [place]  [address] Phone - [phone]   Fax - [phone]  [facility] [address] [place], [place]  [address] Phone - [phone]   Fax - [phone]  [facility] [address] [place], [place]  [address] Phone - [phone]   Fax - [phone]  [facility] [address] [place], [place]  [address] Phone - [phone]   Fax - [phone]   [facility] [address] [place], [place]  [address] Phone - [phone]   Fax - [phone]  [facility] [address] [place], [place]  [address] Phone - [phone]   Fax - [phone]  [facility] [address] [place], [place]  [address] Phone - [phone]   Fax - [phone]  [facility] [address] [place], [place]  [address] Phone - [phone]   Fax - [phone]  [facility] [address] [place], [place]  [address] Phone - [phone]   Fax - [phone] [facility] [address] [place], [place]  [address] Phone - [phone]   Fax - [phone]  [facility] [address] [place], [place]  [address] Phone - [phone]   Fax - [phone]  [facility] [place] [address] [place],   [address] Phone - [phone]   Fax - [phone]  [facility] [place] Charlene Flemming MD [address] [place]  [address] Phone [phone]  Fax [phone]   

## 2017-04-12 NOTE — Progress Notes (Signed)
   PRENATAL VISIT NOTE  Subjective:  Kaitlin Montgomery is a 21 y.o. G1P0000 at 2068w3d being seen today for ongoing prenatal care.  She is currently monitored for the following issues for this low-risk pregnancy and has Anxiety; Depressed mood; Supervision of normal first teen pregnancy; Gall bladder polyp; Elevated amylase; Anemia in pregnancy; and Positive GBS test on her problem list.  Patient reports no complaints.  Contractions: Irregular. Vag. Bleeding: None.  Movement: Present. Denies leaking of fluid.   The following portions of the patient's history were reviewed and updated as appropriate: allergies, current medications, past family history, past medical history, past social history, past surgical history and problem list. Problem list updated.  Objective:   Vitals:   04/12/17 1012  BP: 109/66  Pulse: 91  Weight: 164 lb 12.8 oz (74.8 kg)    Fetal Status: Fetal Heart Rate (bpm): 143 Fundal Height: 36 cm Movement: Present     General:  Alert, oriented and cooperative. Patient is in no acute distress.  Skin: Skin is warm and dry. No rash noted.   Cardiovascular: Normal heart rate noted  Respiratory: Normal respiratory effort, no problems with respiration noted  Abdomen: Soft, gravid, appropriate for gestational age. Pain/Pressure: Absent     Pelvic:  Cervical exam deferred        Extremities: Normal range of motion.  Edema: None  Mental Status: Normal mood and affect. Normal behavior. Normal judgment and thought content.   Assessment and Plan:  Pregnancy: G1P0000 at 2068w3d  1. Supervision of normal first teen pregnancy in third trimester     Doing well  2. Anemia during pregnancy in third trimester     Taking Bloom   3. Positive GBS test     PCN for labor/delivery  4. Gastroesophageal reflux during pregnancy, antepartum, third trimester     Taking TUMS as well.  - omeprazole (PRILOSEC) 20 MG capsule; Take 1 capsule (20 mg total) by mouth 2 (two) times daily before a  meal.  Dispense: 60 capsule; Refill: 5  Term labor symptoms and general obstetric precautions including but not limited to vaginal bleeding, contractions, leaking of fluid and fetal movement were reviewed in detail with the patient. Please refer to After Visit Summary for other counseling recommendations.  Return in about 1 week (around 04/19/2017) for NST.  IOL scheduled.    Roe Coombsachelle A Katya Rolston, CNM

## 2017-04-14 ENCOUNTER — Telehealth (HOSPITAL_COMMUNITY): Payer: Self-pay | Admitting: *Deleted

## 2017-04-14 ENCOUNTER — Encounter (HOSPITAL_COMMUNITY): Payer: Self-pay | Admitting: *Deleted

## 2017-04-14 NOTE — Telephone Encounter (Signed)
Preadmission screen  

## 2017-04-20 ENCOUNTER — Encounter: Payer: Self-pay | Admitting: Certified Nurse Midwife

## 2017-04-20 ENCOUNTER — Inpatient Hospital Stay (HOSPITAL_COMMUNITY)
Admission: AD | Admit: 2017-04-20 | Discharge: 2017-04-23 | DRG: 775 | Disposition: A | Discharge status: Home / Self Care | Payer: Medicaid Other | Source: Ambulatory Visit | Attending: Obstetrics and Gynecology | Admitting: Obstetrics and Gynecology

## 2017-04-20 ENCOUNTER — Ambulatory Visit (INDEPENDENT_AMBULATORY_CARE_PROVIDER_SITE_OTHER): Payer: Medicaid Other | Admitting: Certified Nurse Midwife

## 2017-04-20 VITALS — BP 106/70 | HR 77 | Wt 167.0 lb

## 2017-04-20 DIAGNOSIS — Z3A40 40 weeks gestation of pregnancy: Secondary | ICD-10-CM

## 2017-04-20 DIAGNOSIS — O9902 Anemia complicating childbirth: Secondary | ICD-10-CM | POA: Diagnosis present

## 2017-04-20 DIAGNOSIS — Z3403 Encounter for supervision of normal first pregnancy, third trimester: Secondary | ICD-10-CM

## 2017-04-20 DIAGNOSIS — O99824 Streptococcus B carrier state complicating childbirth: Secondary | ICD-10-CM | POA: Diagnosis present

## 2017-04-20 DIAGNOSIS — Z87891 Personal history of nicotine dependence: Secondary | ICD-10-CM | POA: Diagnosis not present

## 2017-04-20 DIAGNOSIS — D649 Anemia, unspecified: Secondary | ICD-10-CM | POA: Diagnosis present

## 2017-04-20 DIAGNOSIS — Z3493 Encounter for supervision of normal pregnancy, unspecified, third trimester: Secondary | ICD-10-CM | POA: Diagnosis present

## 2017-04-20 DIAGNOSIS — O99013 Anemia complicating pregnancy, third trimester: Secondary | ICD-10-CM

## 2017-04-20 DIAGNOSIS — O480 Post-term pregnancy: Secondary | ICD-10-CM | POA: Diagnosis present

## 2017-04-20 LAB — CBC
HCT: 31 % — ABNORMAL LOW (ref 36.0–46.0)
HEMOGLOBIN: 10.8 g/dL — AB (ref 12.0–15.0)
MCH: 30.9 pg (ref 26.0–34.0)
MCHC: 34.8 g/dL (ref 30.0–36.0)
MCV: 88.8 fL (ref 78.0–100.0)
Platelets: 142 10*3/uL — ABNORMAL LOW (ref 150–400)
RBC: 3.49 MIL/uL — AB (ref 3.87–5.11)
RDW: 13.1 % (ref 11.5–15.5)
WBC: 14.8 10*3/uL — ABNORMAL HIGH (ref 4.0–10.5)

## 2017-04-20 MED ORDER — FENTANYL CITRATE (PF) 100 MCG/2ML IJ SOLN
100.0000 ug | INTRAMUSCULAR | Status: DC | PRN
  Administered 2017-04-21: 100 ug via INTRAVENOUS
  Filled 2017-04-20: qty 2

## 2017-04-20 MED ORDER — PENICILLIN G POT IN DEXTROSE 60000 UNIT/ML IV SOLN
3.0000 10*6.[IU] | INTRAVENOUS | Status: DC
  Administered 2017-04-21: 3 10*6.[IU] via INTRAVENOUS
  Filled 2017-04-20 (×3): qty 50

## 2017-04-20 MED ORDER — OXYCODONE-ACETAMINOPHEN 5-325 MG PO TABS: 2.0000 | ORAL_TABLET | ORAL | Status: DC | PRN

## 2017-04-20 MED ORDER — OXYTOCIN 40 UNITS IN LACTATED RINGERS INFUSION - SIMPLE MED
2.5000 [IU]/h | INTRAVENOUS | Status: DC
  Filled 2017-04-20: qty 1000

## 2017-04-20 MED ORDER — ONDANSETRON HCL 4 MG/2ML IJ SOLN
4.0000 mg | Freq: Four times a day (QID) | INTRAMUSCULAR | Status: DC | PRN
  Administered 2017-04-21: 4 mg via INTRAVENOUS
  Filled 2017-04-20: qty 2

## 2017-04-20 MED ORDER — ACETAMINOPHEN 325 MG PO TABS
650.0000 mg | ORAL_TABLET | ORAL | Status: DC | PRN
Start: 2017-04-20 — End: 2017-04-21

## 2017-04-20 MED ORDER — PENICILLIN G POTASSIUM 5000000 UNITS IJ SOLR
5.0000 10*6.[IU] | Freq: Once | INTRAVENOUS | Status: AC
  Administered 2017-04-21: 5 10*6.[IU] via INTRAVENOUS
  Filled 2017-04-20: qty 5

## 2017-04-20 MED ORDER — LIDOCAINE HCL (PF) 1 % IJ SOLN
30.0000 mL | INTRAMUSCULAR | Status: DC | PRN
  Administered 2017-04-21: 30 mL via SUBCUTANEOUS
  Filled 2017-04-20: qty 30

## 2017-04-20 MED ORDER — LACTATED RINGERS IV SOLN: 500.0000 mL | INTRAVENOUS | Status: DC | PRN

## 2017-04-20 MED ORDER — FLEET ENEMA 7-19 GM/118ML RE ENEM: 1.0000 | ENEMA | RECTAL | Status: DC | PRN

## 2017-04-20 MED ORDER — MORPHINE SULFATE (PF) 10 MG/ML IV SOLN
10.0000 mg | Freq: Once | INTRAVENOUS | Status: DC
Start: 2017-04-20 — End: 2017-04-20

## 2017-04-20 MED ORDER — MORPHINE SULFATE (PF) 10 MG/ML IV SOLN
10.0000 mg | Freq: Once | INTRAVENOUS | Status: AC
  Administered 2017-04-20: 10 mg via INTRAMUSCULAR
  Filled 2017-04-20: qty 1

## 2017-04-20 MED ORDER — SOD CITRATE-CITRIC ACID 500-334 MG/5ML PO SOLN: 30.0000 mL | ORAL | Status: DC | PRN

## 2017-04-20 MED ORDER — OXYTOCIN BOLUS FROM INFUSION
500.0000 mL | Freq: Once | INTRAVENOUS | Status: AC
  Administered 2017-04-21: 500 mL via INTRAVENOUS

## 2017-04-20 MED ORDER — OXYCODONE-ACETAMINOPHEN 5-325 MG PO TABS: 1.0000 | ORAL_TABLET | ORAL | Status: DC | PRN

## 2017-04-20 MED ORDER — LACTATED RINGERS IV SOLN
INTRAVENOUS | Status: DC
  Administered 2017-04-21 (×2): via INTRAVENOUS

## 2017-04-20 NOTE — Progress Notes (Signed)
   PRENATAL VISIT NOTE  Subjective:  Kaitlin Montgomery is a 21 y.o. G1P0000 at 4258w4d being seen today for ongoing prenatal care.  She is currently monitored for the following issues for this low-risk pregnancy and has Anxiety; Depressed mood; Supervision of normal first teen pregnancy; Gall bladder polyp; Elevated amylase; Anemia in pregnancy; and Positive GBS test on her problem list.  Patient reports no complaints.  Contractions: Not present. Vag. Bleeding: None.  Movement: Present. Denies leaking of fluid.   The following portions of the patient's history were reviewed and updated as appropriate: allergies, current medications, past family history, past medical history, past social history, past surgical history and problem list. Problem list updated.  Objective:   Vitals:   04/20/17 1116  BP: 106/70  Pulse: 77  Weight: 167 lb (75.8 kg)    Fetal Status: Fetal Heart Rate (bpm): NST; reactive Fundal Height: 37 cm Movement: Present  Presentation: Vertex  General:  Alert, oriented and cooperative. Patient is in no acute distress.  Skin: Skin is warm and dry. No rash noted.   Cardiovascular: Normal heart rate noted  Respiratory: Normal respiratory effort, no problems with respiration noted  Abdomen: Soft, gravid, appropriate for gestational age. Pain/Pressure: Absent     Pelvic:  Cervical exam performed Dilation: 2 Effacement (%): 80 Station: -2  Extremities: Normal range of motion.  Edema: None  Mental Status: Normal mood and affect. Normal behavior. Normal judgment and thought content.  NST: + accels, no decels, moderate variability, Cat. 1 tracing. No contractions on toco.   Assessment and Plan:  Pregnancy: G1P0000 at 7458w4d  1. Anemia during pregnancy in third trimester     Taking iron  2. Supervision of normal first teen pregnancy in third trimester     Reactive NST - Fetal nonstress test; Future  3. Post term pregnancy over 40 weeks     IOL scheduled for 04/23/17 - Fetal  nonstress test; Future  Term labor symptoms and general obstetric precautions including but not limited to vaginal bleeding, contractions, leaking of fluid and fetal movement were reviewed in detail with the patient. Please refer to After Visit Summary for other counseling recommendations.  Return in about 4 weeks (around 05/18/2017) for Postpartum.   Roe Coombsachelle A Alsie Younes, CNM

## 2017-04-20 NOTE — H&P (Signed)
Liliana Dang is a 21 y.o. female G1P0, 6w4 determined by [place] for SOL after having her membranes stripped at her prenatal visit today around noon.  She reports that contractions began around 2000, were severe, and occurred every 5-6 mins.  Her contractions have progressively gotten stronger and now are occurring every 2-3 mins.  She admits to fetal movement and nausea, denies LOF, vomiting, headache, RUQ pain, blurry vision, vaginal bleeding or discharge, or edema, gestational htn/dm, hx of PID, ovarian cysts, fibroids, or STDs.  Past medical history includes gall bladder polyp and anemia in pregnancy.    OB History    Gravida Para Term Preterm AB Living   1       0 0   SAB TAB Ectopic Multiple Live Births   0             Past Medical History:  Diagnosis Date  . Anxiety 06/10/2015  . Bronchitis   . Bronchitis   . Depressed mood 07/01/2015  . 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 Keloid 02/11/2015  . Seasonal allergies 04/09/2015   Past Surgical History:  Procedure Laterality Date  . EXTERNAL EAR SURGERY Right    Family History: family history includes Cancer in her maternal grandmother. Social History:  reports that she quit smoking about 7 months ago. Her smoking use included Cigars. She has never used smokeless tobacco. She reports that she does not drink alcohol or use drugs.     Maternal Diabetes: No Genetic Screening: Normal Maternal Ultrasounds/Referrals: Normal Fetal Ultrasounds or other Referrals:  None Maternal Substance Abuse:  No Significant Maternal Medications:  None Significant Maternal Lab Results:  Lab values include: Group B Strep positive Other Comments:  None  Review of Systems  Eyes: Negative for blurred vision.  Respiratory: Negative for shortness of breath.   Cardiovascular: Negative for chest pain and leg swelling.  Gastrointestinal: Positive for nausea. Negative for vomiting.  Genitourinary: Negative for dysuria.   Maternal Medical History:  Reason  for admission: Contractions and nausea.   Contractions: Onset was 3-5 hours ago.   Frequency: regular.   Duration is approximately 30 seconds.   Perceived severity is strong.    Fetal activity: Perceived fetal activity is normal.   Last perceived fetal movement was within the past hour.    Prenatal complications: No bleeding, PIH, pre-eclampsia or preterm labor.   Prenatal Complications - Diabetes: none.    Dilation: 4 Effacement (%): 80 Station: -2 Exam by:: Millner, RN Blood pressure 118/76, pulse 86, temperature 98.3 F (36.8 C), temperature source Oral, resp. rate 20, height 5\' 7"  (1.702 m), weight 76 kg (167 lb 8 oz), last menstrual period 07/10/2016. Maternal Exam:  Uterine Assessment: Contraction strength is firm.  Contraction frequency is regular.      Fetal Exam Fetal Monitor Review: Baseline rate: 135.  Variability: moderate (6-25 bpm).   Pattern: accelerations present and no decelerations.    Fetal State Assessment: Category I - tracings are normal.     Physical Exam  Constitutional: She is oriented to person, place, and time. She appears well-developed and well-nourished.  Cardiovascular: Normal rate and regular rhythm.   Respiratory: Effort normal and breath sounds normal.  Neurological: She is alert and oriented to person, place, and time.  Skin: Skin is warm and dry.    Prenatal labs: ABO, Rh: --/--/O POS, O POS (02/20 2119) Antibody: NEG (02/20 2119) Rubella: 5.11 (11/06 1448) RPR: Non Reactive (03/26 1140)  HBsAg: Negative (11/06 1448)  HIV: Non Reactive (03/26  1140)  GBS: Positive (05/07 1520)   Assessment/Plan: Nicolette BangDestiny Stidham is a 21 y.o. female G1P0, 3741w4d determined by [place] who presents for SOL  Labor: SOL, augment as needed MOC: ParaGard IUD Feeding: Breast/bottle ID: GBS + PCN Pain management: Nitrous, IV pain meds  Expected NSVD  Jeanann LewandowskyBethany Eaton PA-S 04/20/2017, 11:44 PM   OB FELLOW HISTORY AND PHYSICAL ATTESTATION  I have  seen and examined this patient; I agree with above documentation in the student's note.  I saw this patient independently from the student and examined her myself. I agree with the above findings.    Jen MowElizabeth Mumaw, DO OB Fellow

## 2017-04-20 NOTE — Progress Notes (Signed)
Patient is Postdates. Placed on NST machine.

## 2017-04-20 NOTE — MAU Note (Signed)
Pt says UC  Hurt strong  Since 9pm. .   VE in office  2  Cm today - stripped  Membranes . Denies  HSV  Or MRSS.   GBS- positive

## 2017-04-21 ENCOUNTER — Encounter (HOSPITAL_COMMUNITY): Payer: Self-pay

## 2017-04-21 ENCOUNTER — Inpatient Hospital Stay (HOSPITAL_COMMUNITY): Payer: Medicaid Other | Admitting: Anesthesiology

## 2017-04-21 DIAGNOSIS — Z3A40 40 weeks gestation of pregnancy: Secondary | ICD-10-CM

## 2017-04-21 DIAGNOSIS — O99824 Streptococcus B carrier state complicating childbirth: Secondary | ICD-10-CM

## 2017-04-21 LAB — TYPE AND SCREEN
ABO/RH(D): O POS
Antibody Screen: NEGATIVE

## 2017-04-21 LAB — RPR: RPR Ser Ql: NONREACTIVE

## 2017-04-21 MED ORDER — ZOLPIDEM TARTRATE 5 MG PO TABS: 5.0000 mg | ORAL_TABLET | Freq: Every evening | ORAL | Status: DC | PRN

## 2017-04-21 MED ORDER — BENZOCAINE-MENTHOL 20-0.5 % EX AERO
1.0000 "application " | INHALATION_SPRAY | CUTANEOUS | Status: DC | PRN
  Administered 2017-04-21: 1 via TOPICAL
  Filled 2017-04-21: qty 56

## 2017-04-21 MED ORDER — PRENATAL MULTIVITAMIN CH
1.0000 | ORAL_TABLET | Freq: Every day | ORAL | Status: DC
  Administered 2017-04-21 – 2017-04-23 (×3): 1 via ORAL
  Filled 2017-04-21 (×3): qty 1

## 2017-04-21 MED ORDER — SIMETHICONE 80 MG PO CHEW: 80.0000 mg | CHEWABLE_TABLET | ORAL | Status: DC | PRN

## 2017-04-21 MED ORDER — EPHEDRINE 5 MG/ML INJ
10.0000 mg | INTRAVENOUS | Status: DC | PRN
  Filled 2017-04-21: qty 2

## 2017-04-21 MED ORDER — DIPHENHYDRAMINE HCL 50 MG/ML IJ SOLN: 12.5000 mg | INTRAMUSCULAR | Status: DC | PRN

## 2017-04-21 MED ORDER — ONDANSETRON HCL 4 MG/2ML IJ SOLN: 4.0000 mg | INTRAMUSCULAR | Status: DC | PRN

## 2017-04-21 MED ORDER — ACETAMINOPHEN 325 MG PO TABS: 650.0000 mg | ORAL_TABLET | ORAL | Status: DC | PRN

## 2017-04-21 MED ORDER — LIDOCAINE HCL (PF) 1 % IJ SOLN
INTRAMUSCULAR | Status: DC | PRN
Start: 2017-04-21 — End: 2017-04-21
  Administered 2017-04-21: 4 mL
  Administered 2017-04-21: 6 mL via EPIDURAL

## 2017-04-21 MED ORDER — IBUPROFEN 600 MG PO TABS
600.0000 mg | ORAL_TABLET | Freq: Four times a day (QID) | ORAL | Status: DC
  Administered 2017-04-21 – 2017-04-23 (×7): 600 mg via ORAL
  Filled 2017-04-21 (×8): qty 1

## 2017-04-21 MED ORDER — DIBUCAINE 1 % RE OINT: 1.0000 "application " | TOPICAL_OINTMENT | RECTAL | Status: DC | PRN

## 2017-04-21 MED ORDER — SENNOSIDES-DOCUSATE SODIUM 8.6-50 MG PO TABS
2.0000 | ORAL_TABLET | ORAL | Status: DC
  Administered 2017-04-22 – 2017-04-23 (×2): 2 via ORAL
  Filled 2017-04-21 (×2): qty 2

## 2017-04-21 MED ORDER — LACTATED RINGERS IV SOLN
500.0000 mL | Freq: Once | INTRAVENOUS | Status: AC
  Administered 2017-04-21: 500 mL via INTRAVENOUS

## 2017-04-21 MED ORDER — PHENYLEPHRINE 40 MCG/ML (10ML) SYRINGE FOR IV PUSH (FOR BLOOD PRESSURE SUPPORT)
80.0000 ug | PREFILLED_SYRINGE | INTRAVENOUS | Status: DC | PRN
  Filled 2017-04-21: qty 5

## 2017-04-21 MED ORDER — PHENYLEPHRINE 40 MCG/ML (10ML) SYRINGE FOR IV PUSH (FOR BLOOD PRESSURE SUPPORT)
80.0000 ug | PREFILLED_SYRINGE | INTRAVENOUS | Status: DC | PRN
  Filled 2017-04-21: qty 5
  Filled 2017-04-21: qty 10

## 2017-04-21 MED ORDER — ONDANSETRON HCL 4 MG PO TABS: 4.0000 mg | ORAL_TABLET | ORAL | Status: DC | PRN

## 2017-04-21 MED ORDER — FENTANYL 2.5 MCG/ML BUPIVACAINE 1/10 % EPIDURAL INFUSION (WH - ANES)
14.0000 mL/h | INTRAMUSCULAR | Status: DC | PRN
  Administered 2017-04-21: 14 mL/h via EPIDURAL
  Filled 2017-04-21: qty 100

## 2017-04-21 MED ORDER — DIPHENHYDRAMINE HCL 25 MG PO CAPS: 25.0000 mg | ORAL_CAPSULE | Freq: Four times a day (QID) | ORAL | Status: DC | PRN

## 2017-04-21 MED ORDER — COCONUT OIL OIL: 1.0000 "application " | TOPICAL_OIL | Status: DC | PRN

## 2017-04-21 MED ORDER — WITCH HAZEL-GLYCERIN EX PADS: 1.0000 "application " | MEDICATED_PAD | CUTANEOUS | Status: DC | PRN

## 2017-04-21 MED ORDER — TETANUS-DIPHTH-ACELL PERTUSSIS 5-2.5-18.5 LF-MCG/0.5 IM SUSP: 0.5000 mL | Freq: Once | INTRAMUSCULAR | Status: DC

## 2017-04-21 NOTE — Progress Notes (Signed)
Patient ID: Nicolette Bangestiny Schnitker, female   DOB: 1996/02/07, 21 y.o.   MRN: [id number]  S: Patient seen & examined for progress of labor. Patient comfortable with epidural.    O:  Vitals:   04/21/17 0430 04/21/17 0500 04/21/17 0530 04/21/17 0602  BP: 115/69 115/75 (!) 115/91 (!) 117/95  Pulse: 90 79 87 90  Resp:  20    Temp:      TempSrc:      SpO2:      Weight:      Height:        Dilation: 10 Dilation Complete Date: 04/21/17 Dilation Complete Time: [id number] Effacement (%): 100 Cervical Position: Middle Station: +1, +2 Presentation: Vertex Exam by:: Marcel Sorter, DO  AROM performed, scant clear fluid returned  FHT: 145bpm, mod var, +accels, no decels TOCO: q2-24min   A/P: AROM Continue expectant management Anticipate SVD

## 2017-04-21 NOTE — Anesthesia Preprocedure Evaluation (Signed)
Anesthesia Evaluation  Patient identified by MRN, date of birth, ID band Patient awake    Reviewed: Allergy & Precautions, H&P , Patient's Chart, lab work & pertinent test results  Airway Mallampati: II  TM Distance: >3 FB Neck ROM: full    Dental  (+) Teeth Intact   Pulmonary asthma , former smoker,    breath sounds clear to auscultation       Cardiovascular  Rhythm:regular Rate:Normal     Neuro/Psych    GI/Hepatic   Endo/Other    Renal/GU      Musculoskeletal   Abdominal   Peds  Hematology   Anesthesia Other Findings       Reproductive/Obstetrics (+) Pregnancy                             Anesthesia Physical Anesthesia Plan  ASA: II  Anesthesia Plan: Epidural   Post-op Pain Management:    Induction:   PONV Risk Score and Plan:   Airway Management Planned:   Additional Equipment:   Intra-op Plan:   Post-operative Plan:   Informed Consent: I have reviewed the patients History and Physical, chart, labs and discussed the procedure including the risks, benefits and alternatives for the proposed anesthesia with the patient or authorized representative who has indicated his/her understanding and acceptance.   Dental Advisory Given  Plan Discussed with:   Anesthesia Plan Comments: (Labs checked- platelets confirmed with RN in room. Fetal heart tracing, per RN, reported to be stable enough for sitting procedure. Discussed epidural, and patient consents to the procedure:  included risk of possible headache,backache, failed block, allergic reaction, and nerve injury. This patient was asked if she had any questions or concerns before the procedure started.)        Anesthesia Quick Evaluation

## 2017-04-21 NOTE — Anesthesia Procedure Notes (Signed)
Epidural Patient location during procedure: OB  Staffing Anesthesiologist: Yizel Canby  Preanesthetic Checklist Completed: patient identified, site marked, surgical consent, pre-op evaluation, timeout performed, IV checked, risks and benefits discussed and monitors and equipment checked  Epidural Patient position: sitting Prep: site prepped and draped and DuraPrep Patient monitoring: continuous pulse ox and blood pressure Approach: midline Location: L3-L4 Injection technique: LOR air  Needle:  Needle type: Tuohy  Needle gauge: 17 G Needle length: 9 cm and 9 Needle insertion depth: 5 cm cm Catheter type: closed end flexible Catheter size: 19 Gauge Catheter at skin depth: 10 cm Test dose: negative  Assessment Events: blood not aspirated, injection not painful, no injection resistance, negative IV test and no paresthesia  Additional Notes Dosing of Epidural:  1st dose, through catheter .............................................  Xylocaine 40 mg  2nd dose, through catheter, after waiting 3 minutes.........Xylocaine 60 mg    As each dose occurred, patient was free of IV sx; and patient exhibited no evidence of SA injection.  Patient is more comfortable after epidural dosed. Please see RN's note for documentation of vital signs,and FHR which are stable.  Patient reminded not to try to ambulate with numb legs, and that an RN must be present when she attempts to get up.        

## 2017-04-21 NOTE — Anesthesia Postprocedure Evaluation (Signed)
Anesthesia Post Note  Patient: Sales executiveDestiny Montgomery  Procedure(s) Performed: * No procedures listed *     Patient location during evaluation: Mother Baby Anesthesia Type: Epidural Level of consciousness: awake Pain management: pain level controlled Vital Signs Assessment: post-procedure vital signs reviewed and stable Respiratory status: spontaneous breathing Cardiovascular status: stable Postop Assessment: no headache, no backache, epidural receding, patient able to bend at knees, no signs of nausea or vomiting and adequate PO intake Anesthetic complications: no    Last Vitals:  Vitals:   04/21/17 0842 04/21/17 1020  BP: 117/75 121/79  Pulse: (!) 105 81  Resp: 16 20  Temp: 36.7 C 36.6 C    Last Pain:  Vitals:   04/21/17 1020  TempSrc: Oral  PainSc:    Pain Goal:                 Egidio Lofgren

## 2017-04-22 LAB — CBC
HEMATOCRIT: 27.7 % — AB (ref 36.0–46.0)
HEMOGLOBIN: 9.6 g/dL — AB (ref 12.0–15.0)
MCH: 31.3 pg (ref 26.0–34.0)
MCHC: 34.7 g/dL (ref 30.0–36.0)
MCV: 90.2 fL (ref 78.0–100.0)
Platelets: 137 10*3/uL — ABNORMAL LOW (ref 150–400)
RBC: 3.07 MIL/uL — AB (ref 3.87–5.11)
RDW: 13.3 % (ref 11.5–15.5)
WBC: 13 10*3/uL — ABNORMAL HIGH (ref 4.0–10.5)

## 2017-04-22 MED ORDER — FERROUS SULFATE 325 (65 FE) MG PO TABS
325.0000 mg | ORAL_TABLET | Freq: Three times a day (TID) | ORAL | Status: DC
  Administered 2017-04-22 – 2017-04-23 (×4): 325 mg via ORAL
  Filled 2017-04-22 (×4): qty 1

## 2017-04-22 NOTE — Progress Notes (Signed)
UR chart review completed.  

## 2017-04-22 NOTE — Progress Notes (Signed)
CSW received consult for hx of marijuana use.  Referral was screened out due to the following: ~[name] had no documented substance use after initial prenatal visit/+UPT. ~[name] had no positive drug screens after initial prenatal visit/+UPT. ~Baby's UDS is negative.  Please consult CSW if current concerns arise or by [name]'s request.  CSW will monitor CDS results and make report to Child Protective Services if warranted.  ---------  [name] was referred for history of depression/anxiety. Referral is screened out by Clinical Social Worker because none of the following criteria appear to apply and there are no reports impacting the pregnancy or her transition to the postpartum period.  CSW does not deem it clinically necessary to further investigate at this time.   -History of anxiety/depression during this pregnancy, or of post-partum depression. - Diagnosis of anxiety and/or depression within last 3 years.-  - History of depression due to pregnancy loss/loss of child or -[name]'s symptoms are currently being treated with medication and/or therapy.  CSW met with [name] at bedside to assess hx of anxiety/depression. At this time, [name] noted this was back on 2016 and she did not actually experience it. She recalled going to a OB apt to obtain BC and completing a questionnaire that may have lead the OB to believe she was experiencing anxiety/depression. [name] denies experiencing ever and notes she does not desire further clinical intervention. [name] was open to CSW leaving newborn checklist and PPMD resource. Please contact the Clinical Social Worker if needs arise or upon [name] request.    Bearett Porcaro, MSW, LCSW-A Clinical Social Worker  [facility]  Office: [phone]   

## 2017-04-22 NOTE — Progress Notes (Signed)
Post Partum Day #1 Subjective: no complaints, up ad lib, voiding and tolerating PO  Objective: Blood pressure (!) 96/54, pulse 85, temperature 97.7 F (36.5 C), temperature source Oral, resp. rate 18, height 5\' 7"  (1.702 m), weight 167 lb 8 oz (76 kg), last menstrual period 07/10/2016, SpO2 99 %, unknown if currently breastfeeding.  Physical Exam:  General: cooperative, fatigued and no distress Lochia: appropriate Uterine Fundus: firm Incision: 1st deg lac; healing DVT Evaluation: No evidence of DVT seen on physical exam. No cords or calf tenderness. No significant calf/ankle edema.   Recent Labs  04/20/17 2345 04/22/17 0504  HGB 10.8* 9.6*  HCT 31.0* 27.7*    Assessment/Plan: Plan for discharge tomorrow and Contraception IUD planned.  Anemia: asymptomaic, iron started   LOS: 2 days   Kaitlin Montgomery, CNM 04/22/2017, 7:12 AM

## 2017-04-22 NOTE — Lactation Note (Signed)
This note was copied from a baby's chart. Lactation Consultation Note  Baby 9133 hours old. Mother has changed her mind and would like to breastfeed.  Baby has breastfed x2. Reviewed hand expression.  Drops easily expressed. Assisted w/ latching in cross cradle hold which mother changed to cradle. Sucks and swallows observed. Mom encouraged to feed baby 8-12 times/24 hours and with feeding cues.  Discussed basics including cluster feeding. Mom made aware of O/P services, breastfeeding support groups, community resources, and our phone # for post-discharge questions.    Patient Name: Kaitlin Montgomery Today's Date: 04/22/2017 Reason for consult: Initial assessment   Maternal Data Has patient been taught Hand Expression?: Yes Does the patient have breastfeeding experience prior to this delivery?: No  Feeding Feeding Type: Breast Fed Nipple Type: Slow - flow  LATCH Score/Interventions Latch: Grasps breast easily, tongue down, lips flanged, rhythmical sucking.  Audible Swallowing: A few with stimulation  Type of Nipple: Everted at rest and after stimulation  Comfort (Breast/Nipple): Soft / non-tender     Hold (Positioning): Assistance needed to correctly position infant at breast and maintain latch.  LATCH Score: 8  Lactation Tools Discussed/Used     Consult Status Consult Status: Follow-up Date: 04/23/17 Follow-up type: In-patient    Dahlia ByesBerkelhammer, [facility] Hospital[facility]chen 04/22/2017, 4:11 PM

## 2017-04-23 ENCOUNTER — Inpatient Hospital Stay (HOSPITAL_COMMUNITY): Admission: RE | Admit: 2017-04-23 | Payer: Medicaid Other | Source: Ambulatory Visit

## 2017-04-23 ENCOUNTER — Encounter (HOSPITAL_COMMUNITY): Payer: Self-pay | Admitting: *Deleted

## 2017-04-23 MED ORDER — IBUPROFEN 600 MG PO TABS: 600.0000 mg | ORAL_TABLET | Freq: Four times a day (QID) | ORAL | 0 refills | Status: DC

## 2017-04-23 NOTE — Discharge Summary (Signed)
OB Discharge Summary  Patient Name: Kaitlin Montgomery DOB: [birth year] MRN: [id number]  Date of admission: 04/20/2017 Delivering MD: Michaele OfferMUMAW, ELIZABETH WOODLAND   Date of discharge: 04/23/2017  Admitting diagnosis: 40WKS CONTRACTIONS 4MINS Intrauterine pregnancy: 3082w5d     Secondary diagnosis:Active Problems:   Post-dates pregnancy  Additional problems:Teen pregnancy, +GBS     Discharge diagnosis: Term Pregnancy Delivered       Augmentation: AROM  Complications: None  Hospital course:  Onset of Labor With Vaginal Delivery     21 y.o. yo G1P1001 at 6682w5d was admitted in Active Labor on 04/20/2017. Patient had an uncomplicated labor course as follows:  Membrane Rupture Time/Date: 6:10 AM ,04/21/2017   Intrapartum Procedures: Episiotomy: None [1]                                         Lacerations:  1st degree [2]  Patient had a delivery of a Viable infant. 04/21/2017  Information for the patient's newborn:  Smitty PluckCulliver, Girl Carrin [[id number]][[id number]]  Delivery Method: Vaginal, Spontaneous Delivery (Filed from Delivery Summary)    Pateint had an uncomplicated postpartum course.  She is ambulating, tolerating a regular diet, passing flatus, and urinating well. Patient is discharged home in stable condition on 04/23/17.   Physical exam  Vitals:   04/21/17 1744 04/22/17 0506 04/22/17 1756 04/23/17 0647  BP: 102/62 (!) 96/54 102/63 (!) 101/51  Pulse: 80 85 96 78  Resp: 18 18 19 18   Temp: 98.9 F (37.2 C) 97.7 F (36.5 C) 97.8 F (36.6 C) 98.2 F (36.8 C)  TempSrc: Oral Oral Oral Oral  SpO2: 99%  99% 100%  Weight:      Height:       General: alert Lochia: appropriate Uterine Fundus: firm Incision: N/A DVT Evaluation: No evidence of DVT seen on physical exam. Labs: Lab Results  Component Value Date   WBC 13.0 (H) 04/22/2017   HGB 9.6 (L) 04/22/2017   HCT 27.7 (L) 04/22/2017   MCV 90.2 04/22/2017   PLT 137 (L) 04/22/2017   CMP Latest Ref Rng & Units 03/01/2017  Glucose 65  - 99 mg/dL -  BUN 6 - 20 mg/dL -  Creatinine 7.820.44 - 9.561.00 mg/dL -  Sodium 213135 - 086145 mmol/L -  Potassium 3.5 - 5.1 mmol/L -  Chloride 101 - 111 mmol/L -  CO2 22 - 32 mmol/L -  Calcium 8.9 - 10.3 mg/dL -  Total Protein 6.0 - 8.5 g/dL 6.5  Total Bilirubin 0.0 - 1.2 mg/dL 0.2  Alkaline Phos 39 - 117 IU/L 77  AST 0 - 40 IU/L 13  ALT 0 - 32 IU/L 4    Discharge instruction: per After Visit Summary and "Baby and Me Booklet".  After Visit Meds:  Allergies as of 04/23/2017   No Known Allergies     Medication List    TAKE these medications   CITRANATAL BLOOM 90-1 MG Tabs Take 1 tablet by mouth daily.   ibuprofen 600 MG tablet Commonly known as:  ADVIL,MOTRIN Take 1 tablet (600 mg total) by mouth every 6 (six) hours.       Diet: routine diet  Activity: Advance as tolerated. Pelvic rest for 6 weeks.   Outpatient follow up:4 weeks Follow up Appt:Future Appointments Date Time Provider Department Center  05/21/2017 10:45 AM Orvilla Cornwallenney, Rachelle A, CNM [facility] None   Follow up visit: No Follow-up  on file.  Postpartum contraception: Planning Paragard  Newborn Data: Live born female  Birth Weight: 7 lb 5.1 oz (3320 g) APGAR: 9, 9  Baby Feeding: Bottle and Breast Disposition:home with mother   04/23/2017 Allie Bossier, MD

## 2017-04-23 NOTE — Discharge Instructions (Signed)

## 2017-04-23 NOTE — Lactation Note (Signed)
This note was copied from a baby's chart. Lactation Consultation Note  Patient Name: Kaitlin Nicolette BangDestiny Dagley WUXLK'GToday's Date: 04/23/2017 Reason for consult: Follow-up assessment   With this mom of a term baby, now 2552 hours old, and being discharged to home today. Mom is both breast and formula feeding. She was sleep when I went in to see her this morning, woke up up when I walked in the room. The baby was asleep in her crib, with a pacifier in her mouth. I asked mom if she had any questions or concerns for lactation prior to discharge today. Mom too sleepy to answer, so I told her to call for me if she did.     Maternal Data    Feeding Feeding Type: Breast Fed  LATCH Score/Interventions Latch: Grasps breast easily, tongue down, lips flanged, rhythmical sucking.  Audible Swallowing: A few with stimulation  Type of Nipple: Everted at rest and after stimulation  Comfort (Breast/Nipple): Soft / non-tender     Hold (Positioning): No assistance needed to correctly position infant at breast.  LATCH Score: 9  Lactation Tools Discussed/Used     Consult Status Consult Status: Complete Follow-up type: Call as needed    Alfred LevinsLee, Lynnleigh Soden Anne 04/23/2017, 11:31 AM

## 2017-04-23 NOTE — Plan of Care (Signed)
Problem: Bowel/Gastric: Goal: Gastrointestinal status will improve Mom instructed to buy OTC stool softeners as mom's Hgb was 8.8 post delivery and patient placed on Feso4

## 2017-04-27 ENCOUNTER — Encounter: Payer: Medicaid Other | Admitting: Certified Nurse Midwife

## 2017-04-28 ENCOUNTER — Emergency Department (HOSPITAL_COMMUNITY): Payer: Medicaid Other

## 2017-04-28 ENCOUNTER — Encounter (HOSPITAL_COMMUNITY): Payer: Self-pay

## 2017-04-28 ENCOUNTER — Emergency Department (HOSPITAL_COMMUNITY)
Admission: EM | Admit: 2017-04-28 | Discharge: 2017-04-28 | Disposition: A | Discharge status: Home / Self Care | Payer: Medicaid Other | Attending: Emergency Medicine | Admitting: Emergency Medicine

## 2017-04-28 DIAGNOSIS — Z87891 Personal history of nicotine dependence: Secondary | ICD-10-CM | POA: Diagnosis not present

## 2017-04-28 DIAGNOSIS — K5909 Other constipation: Secondary | ICD-10-CM | POA: Insufficient documentation

## 2017-04-28 DIAGNOSIS — O9989 Other specified diseases and conditions complicating pregnancy, childbirth and the puerperium: Secondary | ICD-10-CM | POA: Insufficient documentation

## 2017-04-28 DIAGNOSIS — R109 Unspecified abdominal pain: Secondary | ICD-10-CM

## 2017-04-28 LAB — COMPREHENSIVE METABOLIC PANEL
ALBUMIN: 3.6 g/dL (ref 3.5–5.0)
ALT: 14 U/L (ref 14–54)
ANION GAP: 9 (ref 5–15)
AST: 27 U/L (ref 15–41)
Alkaline Phosphatase: 81 U/L (ref 38–126)
BILIRUBIN TOTAL: 0.3 mg/dL (ref 0.3–1.2)
BUN: 12 mg/dL (ref 6–20)
CO2: 22 mmol/L (ref 22–32)
Calcium: 9.1 mg/dL (ref 8.9–10.3)
Chloride: 108 mmol/L (ref 101–111)
Creatinine, Ser: 0.59 mg/dL (ref 0.44–1.00)
GFR calc Af Amer: >60 mL/min (ref >60)
GFR calc non Af Amer: >60 mL/min (ref >60)
GLUCOSE: 81 mg/dL (ref 65–99)
POTASSIUM: 3.9 mmol/L (ref 3.5–5.1)
SODIUM: 139 mmol/L (ref 135–145)
TOTAL PROTEIN: 7.3 g/dL (ref 6.5–8.1)

## 2017-04-28 LAB — CBC
HEMATOCRIT: 33.7 % — AB (ref 36.0–46.0)
HEMOGLOBIN: 11.2 g/dL — AB (ref 12.0–15.0)
MCH: 29.8 pg (ref 26.0–34.0)
MCHC: 33.2 g/dL (ref 30.0–36.0)
MCV: 89.6 fL (ref 78.0–100.0)
Platelets: 233 10*3/uL (ref 150–400)
RBC: 3.76 MIL/uL — ABNORMAL LOW (ref 3.87–5.11)
RDW: 12.5 % (ref 11.5–15.5)
WBC: 9 10*3/uL (ref 4.0–10.5)

## 2017-04-28 LAB — URINALYSIS, ROUTINE W REFLEX MICROSCOPIC
BACTERIA UA: NONE SEEN
Bilirubin Urine: NEGATIVE
GLUCOSE, UA: NEGATIVE mg/dL
Ketones, ur: NEGATIVE mg/dL
Nitrite: NEGATIVE
PROTEIN: NEGATIVE mg/dL
Specific Gravity, Urine: 1.011 (ref 1.005–1.030)
pH: 7 (ref 5.0–8.0)

## 2017-04-28 MED ORDER — POLYETHYLENE GLYCOL 3350 17 GM/SCOOP PO POWD: ORAL | 0 refills | Status: DC

## 2017-04-28 NOTE — ED Triage Notes (Signed)
She c/o low abd. Discomfort which she feels is "radiating through to my rectum". She states she is  Just over a week post-partum. She is ambulatory and in no distress.

## 2017-04-28 NOTE — ED Notes (Signed)
Soap suds enema given.  Pt tolerated and went to the bathroom.  Pt states that her breast is feeling full and becoming uncomfortable.  EDPA notified

## 2017-04-28 NOTE — Discharge Instructions (Signed)
Take MiraLAX as prescribed. Continue to drink lots of water. You may continue to take ibuprofen as needed for pain control. Follow-up with primary care if you are still having irregular bowel movements in a week. Return to the ED if you develop fever, chills, nausea, vomiting, worsening pain, or blood in your stool.

## 2017-04-28 NOTE — ED Provider Notes (Signed)
[facility]-EMERGENCY DEPT Provider Note   CSN: [id number] Arrival date & time: 04/28/17  1353     History   Chief Complaint Chief Complaint  Patient presents with  . Abdominal Pain    HPI Kaitlin Montgomery is a 21 y.o. female presenting with three-day history lower abdominal pain.  Patient is 7 days postpartum. She had a spontaneous vaginal delivery with medications. She is discharged from the hospital today after delivery with no abdominal pain at that time. However patient states she was given Motrin every day.  Patient reports that for the past several days she has had some lower abdominal pain. It started in her left lower quadrant has moved more central. This pain is described as intermittent, lasting about 30-60 minutes. This improved with ibuprofen. The pain is and achy and sharp. She denies fever, chills, chest pain, shortness of breath, nausea, vomiting, or urinary symptoms. She denies feeling bloated. She cannot remember when her last bowel movement was, but she thinks it was 2 days ago. She denies blood in her stool at that time. Since being discharged from the hospital, she said she's had more painful bowel movements and had to strain. She states they look constipated. She was told to take stool softeners after discharge from the hospital as she was given iron pills, but she did not pick these up.  HPI  Past Medical History:  Diagnosis Date  . Anxiety 06/10/2015  . Bronchitis   . Bronchitis   . Depressed mood 07/01/2015  . 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 Keloid 02/11/2015  . Seasonal allergies 04/09/2015    Patient Active Problem List   Diagnosis Date Noted  . Post-dates pregnancy 04/20/2017  . Positive GBS test 03/21/2017  . Anemia in pregnancy 02/04/2017  . Gall bladder polyp 12/30/2016  . Elevated amylase 12/30/2016  . Supervision of normal first teen pregnancy 09/14/2016  . Depressed mood 07/01/2015  . Anxiety 06/10/2015    Past Surgical History:  Procedure  Laterality Date  . EXTERNAL EAR SURGERY Right     OB History    Gravida Para Term Preterm AB Living   1 1 1    0 1   SAB TAB Ectopic Multiple Live Births   0     0 1       Home Medications    Prior to Admission medications   Medication Sig Start Date End Date Taking? Authorizing Provider  ibuprofen (ADVIL,MOTRIN) 600 MG tablet Take 1 tablet (600 mg total) by mouth every 6 (six) hours. 04/23/17  Yes Dove, Myra C, MD  polyethylene glycol powder (MIRALAX) powder Take 1-2 caps daily as needed for constipation. 04/28/17   Johniya Durfee, PA-C  Prenatal-DSS-FeCb-FeGl-FA (CITRANATAL BLOOM) 90-1 MG TABS Take 1 tablet by mouth daily. Patient not taking: Reported on 04/28/2017 02/11/17   Roe Coombs, CNM    Family History Family History  Problem Relation Age of Onset  . Cancer Maternal Grandmother     Social History Social History  Substance Use Topics  . Smoking status: Former Smoker    Types: Cigars    Quit date: 08/25/2016  . Smokeless tobacco: Never Used  . Alcohol use No     Allergies   Patient has no known allergies.   Review of Systems Review of Systems  All other systems reviewed and are negative.    Physical Exam Updated Vital Signs BP 111/72   Pulse 76   Temp 98.9 F (37.2 C) (Oral)   Resp 16   Ht 5'  7" (1.702 m)   Wt 70.9 kg (156 lb 5 oz)   LMP 07/10/2016 (Exact Date)   SpO2 100%   BMI 24.48 kg/m   Physical Exam  Constitutional: She is oriented to person, place, and time. She appears well-developed and well-nourished. No distress.  HENT:  Head: Normocephalic and atraumatic.  Eyes: Conjunctivae and EOM are normal. Pupils are equal, round, and reactive to light.  Neck: Normal range of motion. Neck supple.  Cardiovascular: Normal rate, regular rhythm, normal heart sounds and intact distal pulses.   Pulmonary/Chest: Effort normal and breath sounds normal. No respiratory distress. She has no wheezes.  Abdominal: Soft. Bowel sounds are normal.  She exhibits no distension and no mass. There is no tenderness. There is no rebound, no guarding, no tenderness at McBurney's point and negative Murphy's sign.  Musculoskeletal: Normal range of motion.  Neurological: She is alert and oriented to person, place, and time.  Skin: Skin is warm and dry. She is not diaphoretic.  Psychiatric: She has a normal mood and affect.     ED Treatments / Results  Labs (all labs ordered are listed, but only abnormal results are displayed) Labs Reviewed  URINALYSIS, ROUTINE W REFLEX MICROSCOPIC - Abnormal; Notable for the following:       Result Value   Hgb urine dipstick LARGE (*)    Leukocytes, UA MODERATE (*)    Squamous Epithelial / LPF 0-5 (*)    All other components within normal limits  CBC - Abnormal; Notable for the following:    RBC 3.76 (*)    Hemoglobin 11.2 (*)    HCT 33.7 (*)    All other components within normal limits  COMPREHENSIVE METABOLIC PANEL    EKG  EKG Interpretation None       Radiology Dg Abd 2 Views  Result Date: 04/28/2017 CLINICAL DATA:  Acute lower abdominal pain. EXAM: ABDOMEN - 2 VIEW COMPARISON:  None. FINDINGS: The bowel gas pattern is normal. There is no evidence of free air. No radio-opaque calculi or other significant radiographic abnormality is seen. IMPRESSION: No evidence of bowel obstruction or ileus. Electronically Signed   By: Lupita Raider, M.D.   On: 04/28/2017 15:53    Procedures Procedures (including critical care time)  Medications Ordered in ED Medications - No data to display   Initial Impression / Assessment and Plan / ED Course  I have reviewed the triage vital signs and the nursing notes.  Pertinent labs & imaging results that were available during my care of the patient were reviewed by me and considered in my medical decision making (see chart for details).     Patient story and exam most likely indicates constipation. No evidence that abdominal pain is related to recent  delivery. Patient without urinary or vaginal complaints. No fever, chills, or nausea indicating a more systemic etiology.  X-ray shows no sign of obstruction or ileus, but does show a big stool load. Enema given, and patient successfully had a bowel movement. Patient needed to return home so that her baby could breast-feed. Will discharge patient with prescription for MiraLAX. Discussed return precautions. Patient follow-up with primary care if constipation is not resolved. Patient states she understands and agrees to plan.  Final Clinical Impressions(s) / ED Diagnoses   Final diagnoses:  Abdominal pain  Other constipation    New Prescriptions Discharge Medication List as of 04/28/2017  5:13 PM    START taking these medications   Details  polyethylene glycol powder (MIRALAX)  powder Take 1-2 caps daily as needed for constipation., Print         Alveria ApleyCaccavale, Welda Azzarello, PA-C 04/28/17 1808    Melene PlanFloyd, Dan, DO 04/29/17 [phone]

## 2017-04-29 ENCOUNTER — Ambulatory Visit: Payer: Medicaid Other | Admitting: Certified Nurse Midwife

## 2017-05-21 ENCOUNTER — Encounter: Payer: Self-pay | Admitting: Certified Nurse Midwife

## 2017-05-21 ENCOUNTER — Ambulatory Visit (INDEPENDENT_AMBULATORY_CARE_PROVIDER_SITE_OTHER): Payer: Medicaid Other | Admitting: Certified Nurse Midwife

## 2017-05-21 VITALS — BP 111/71 | HR 73 | Wt 166.4 lb

## 2017-05-21 DIAGNOSIS — Z3403 Encounter for supervision of normal first pregnancy, third trimester: Secondary | ICD-10-CM

## 2017-05-21 NOTE — Progress Notes (Signed)
Post Partum Exam  Kaitlin Montgomery is a 21 y.o. [id number] female who presents for a postpartum visit. She is 4 weeks postpartum following a spontaneous vaginal delivery. I have fully reviewed the prenatal and intrapartum course. The delivery was at 41 gestational weeks.  Anesthesia: epidural. Postpartum course has been difficult- constipation. Baby's course has been normal. Baby is feeding by both breast and bottle - Similac Advance and Similac with Iron. Bleeding no bleeding. Bowel function is Patient states when she takes the PNV she gets constipation.. Bladder function is normal. Patient is sexually active. Contraception method is abstinence. Postpartum depression screening:neg  The following portions of the patient's history were reviewed and updated as appropriate: allergies, current medications, past family history, past medical history, past social history, past surgical history and problem list.  Review of Systems Pertinent items noted in HPI and remainder of comprehensive ROS otherwise negative.    Objective:  Blood pressure 111/71, pulse 73, weight 166 lb 6.4 oz (75.5 kg), last menstrual period 05/21/2017, currently breastfeeding.  General:  alert, cooperative and no distress   Breasts:  inspection negative, no nipple discharge or bleeding, no masses or nodularity palpable  Lungs: clear to auscultation bilaterally  Heart:  regular rate and rhythm, S1, S2 normal, no murmur, click, rub or gallop  Abdomen: soft, non-tender; bowel sounds normal; no masses,  no organomegaly   Vulva:  normal  Pelvic Exam: Not performed.        Assessment:    normal 4 week postpartum exam. Pap smear not done at today's visit <20 years of age.   Contraception counseling  Plan:   1. Contraception: abstinence 2.   Either IUD [place]Kyleena or Nexplanon, information given.  3. Follow up in: 2 weeks for device or as needed.

## 2017-06-07 ENCOUNTER — Ambulatory Visit: Payer: Medicaid Other | Admitting: Certified Nurse Midwife

## 2017-06-14 ENCOUNTER — Ambulatory Visit: Payer: Medicaid Other | Admitting: Certified Nurse Midwife

## 2018-02-01 ENCOUNTER — Encounter: Payer: Self-pay | Admitting: Certified Nurse Midwife

## 2018-02-01 ENCOUNTER — Other Ambulatory Visit (HOSPITAL_COMMUNITY)
Admission: RE | Admit: 2018-02-01 | Discharge: 2018-02-01 | Disposition: A | Discharge status: Home / Self Care | Payer: Medicaid Other | Source: Ambulatory Visit | Attending: Certified Nurse Midwife | Admitting: Certified Nurse Midwife

## 2018-02-01 ENCOUNTER — Ambulatory Visit (INDEPENDENT_AMBULATORY_CARE_PROVIDER_SITE_OTHER): Payer: Medicaid Other | Admitting: Certified Nurse Midwife

## 2018-02-01 VITALS — BP 119/77 | HR 94 | Wt 179.0 lb

## 2018-02-01 DIAGNOSIS — Z113 Encounter for screening for infections with a predominantly sexual mode of transmission: Secondary | ICD-10-CM | POA: Diagnosis not present

## 2018-02-01 DIAGNOSIS — B373 Candidiasis of vulva and vagina: Secondary | ICD-10-CM

## 2018-02-01 DIAGNOSIS — Z01419 Encounter for gynecological examination (general) (routine) without abnormal findings: Secondary | ICD-10-CM | POA: Diagnosis not present

## 2018-02-01 DIAGNOSIS — Z309 Encounter for contraceptive management, unspecified: Secondary | ICD-10-CM

## 2018-02-01 DIAGNOSIS — B3731 Acute candidiasis of vulva and vagina: Secondary | ICD-10-CM

## 2018-02-01 DIAGNOSIS — Z3009 Encounter for other general counseling and advice on contraception: Secondary | ICD-10-CM

## 2018-02-01 LAB — POCT URINE PREGNANCY: Preg Test, Ur: NEGATIVE

## 2018-02-01 MED ORDER — TERCONAZOLE 0.8 % VA CREA: 1.0000 | TOPICAL_CREAM | Freq: Every day | VAGINAL | 0 refills | Status: DC

## 2018-02-01 MED ORDER — FLUCONAZOLE 150 MG PO TABS: 150.0000 mg | ORAL_TABLET | Freq: Once | ORAL | 0 refills | Status: AC

## 2018-02-01 NOTE — Progress Notes (Signed)
Subjective:        Kaitlin BangDestiny Montgomery is a 22 y.o. female here for a routine exam.  Current complaints: desires birth control.  New sexual partner two days ago.  Desires full STD screening exam.  Is due for her period this week.      Personal health questionnaire:  Is patient Ashkenazi Jewish, have a family history of breast and/or ovarian cancer: no Is there a family history of uterine cancer diagnosed at age < 2550, gastrointestinal cancer, urinary tract cancer, family member who is a Personnel officerLynch syndrome-associated carrier: no Is the patient overweight and hypertensive, family history of diabetes, personal history of gestational diabetes, preeclampsia or PCOS: no Is patient over 5855, have PCOS,  family history of premature CHD under age 21, diabetes, smoke, have hypertension or peripheral artery disease:  no At any time, has a partner hit, kicked or otherwise hurt or frightened you?: no Over the past 2 weeks, have you felt down, depressed or hopeless?: no Over the past 2 weeks, have you felt little interest or pleasure in doing things?:no   Gynecologic History Patient's last menstrual period was 01/04/2018. Contraception: none Last Pap: n/a <21 years.  Last mammogram: n/a <40 years, no significant family history.  Obstetric History OB History  Gravida Para Term Preterm AB Living  1 1 1    0 1  SAB TAB Ectopic Multiple Live Births  0     0 1    # Outcome Date GA Lbr Len/2nd Weight Sex Delivery Anes PTL Lv  1 Term 04/21/17 [id number] 05:41 / 00:48 7 lb 5.1 oz (3.32 kg) F Vag-Spont EPI  LIV    Past Medical History:  Diagnosis Date  . Anxiety 06/10/2015  . Bronchitis   . Bronchitis   . Depressed mood 07/01/2015  . 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 Keloid 02/11/2015  . Seasonal allergies 04/09/2015    Past Surgical History:  Procedure Laterality Date  . EXTERNAL EAR SURGERY Right      Current Outpatient Medications:  .  fluconazole (DIFLUCAN) 150 MG tablet, Take 1 tablet (150 mg  total) by mouth once for 1 dose., Disp: 1 tablet, Rfl: 0 .  ibuprofen (ADVIL,MOTRIN) 600 MG tablet, Take 1 tablet (600 mg total) by mouth every 6 (six) hours. (Patient not taking: Reported on 02/01/2018), Disp: 30 tablet, Rfl: 0 .  polyethylene glycol powder (MIRALAX) powder, Take 1-2 caps daily as needed for constipation. (Patient not taking: Reported on 02/01/2018), Disp: 255 g, Rfl: 0 .  Prenatal-DSS-FeCb-FeGl-FA (CITRANATAL BLOOM) 90-1 MG TABS, Take 1 tablet by mouth daily. (Patient not taking: Reported on 02/01/2018), Disp: 30 tablet, Rfl: 12 .  terconazole (TERAZOL 3) 0.8 % vaginal cream, Place 1 applicator vaginally at bedtime., Disp: 20 g, Rfl: 0 No Known Allergies  Social History   Tobacco Use  . Smoking status: Former Smoker    Types: Cigars    Last attempt to quit: 08/25/2016    Years since quitting: 1.4  . Smokeless tobacco: Never Used  Substance Use Topics  . Alcohol use: No    Alcohol/week: 0.0 oz    Family History  Problem Relation Age of Onset  . Cancer Maternal Grandmother       Review of Systems  Constitutional: negative for fatigue and weight loss Respiratory: negative for cough and wheezing Cardiovascular: negative for chest pain, fatigue and palpitations Gastrointestinal: negative for abdominal pain and change in bowel habits Musculoskeletal:negative for myalgias Neurological: negative for gait problems and tremors Behavioral/Psych: negative for abusive  relationship, depression Endocrine: negative for temperature intolerance    Genitourinary:negative for abnormal menstrual periods, genital lesions, hot flashes, sexual problems and vaginal discharge Integument/breast: negative for breast lump, breast tenderness, nipple discharge and skin lesion(s)    Objective:       BP 119/77   Pulse 94   Wt 179 lb (81.2 kg)   LMP 01/04/2018   BMI 28.04 kg/m  General:   alert  Skin:   no rash or abnormalities  Lungs:   clear to auscultation bilaterally  Heart:    regular rate and rhythm, S1, S2 normal, no murmur, click, rub or gallop  Breasts:   normal without suspicious masses, skin or nipple changes or axillary nodes  Abdomen:  normal findings: no organomegaly, soft, non-tender and no hernia  Pelvis:  External genitalia: normal general appearance Urinary system: urethral meatus normal and bladder without fullness, nontender Vaginal: normal without tenderness, induration or masses Cervix: no CMT Adnexa: normal bimanual exam Uterus: anteverted and non-tender, normal size   Lab Review Urine pregnancy test Labs reviewed yes Radiologic studies reviewed no  50% of 30 min visit spent on counseling and coordination of care.   Assessment & Plan    Healthy female exam.    1. Screen for STD (sexually transmitted disease)    - RPR - HIV antibody  2. Yeast vaginitis    - fluconazole (DIFLUCAN) 150 MG tablet; Take 1 tablet (150 mg total) by mouth once for 1 dose.  Dispense: 1 tablet; Refill: 0 - terconazole (TERAZOL 3) 0.8 % vaginal cream; Place 1 applicator vaginally at bedtime.  Dispense: 20 g; Refill: 0  3. Well woman exam     - POCT urine pregnancy  4. General counseling and advice for contraceptive management     Plans Nexplanon with her period due later this month/next week - POCT urine pregnancy   Education reviewed: calcium supplements, depression evaluation, low fat, low cholesterol diet, safe sex/STD prevention, self breast exams, skin cancer screening and weight bearing exercise. Follow up in: 1 week.   Meds ordered this encounter  Medications  . fluconazole (DIFLUCAN) 150 MG tablet    Sig: Take 1 tablet (150 mg total) by mouth once for 1 dose.    Dispense:  1 tablet    Refill:  0  . terconazole (TERAZOL 3) 0.8 % vaginal cream    Sig: Place 1 applicator vaginally at bedtime.    Dispense:  20 g    Refill:  0   Orders Placed This Encounter  Procedures  . RPR  . HIV antibody  . POCT urine pregnancy    Possible  management options include: Kyleena Follow up next week for Nexplanon insertion.

## 2018-02-02 LAB — CERVICOVAGINAL ANCILLARY ONLY
Chlamydia: NEGATIVE
Neisseria Gonorrhea: NEGATIVE

## 2018-02-02 NOTE — Addendum Note (Signed)
Addended by: Marya LandryFOSTER, Odeth Bry D on: 02/02/2018 08:46 AM   Modules accepted: Orders

## 2018-02-03 LAB — RPR: RPR: NONREACTIVE

## 2018-02-03 LAB — HIV ANTIBODY (ROUTINE TESTING W REFLEX): HIV Screen 4th Generation wRfx: NONREACTIVE

## 2018-04-16 ENCOUNTER — Encounter (HOSPITAL_COMMUNITY): Payer: Self-pay | Admitting: Emergency Medicine

## 2018-04-16 ENCOUNTER — Emergency Department (HOSPITAL_COMMUNITY)
Admission: EM | Admit: 2018-04-16 | Discharge: 2018-04-16 | Disposition: A | Discharge status: Home / Self Care | Payer: Medicaid Other | Attending: Emergency Medicine | Admitting: Emergency Medicine

## 2018-04-16 DIAGNOSIS — Z87891 Personal history of nicotine dependence: Secondary | ICD-10-CM | POA: Insufficient documentation

## 2018-04-16 DIAGNOSIS — A6004 Herpesviral vulvovaginitis: Secondary | ICD-10-CM

## 2018-04-16 DIAGNOSIS — N898 Other specified noninflammatory disorders of vagina: Secondary | ICD-10-CM

## 2018-04-16 LAB — URINALYSIS, ROUTINE W REFLEX MICROSCOPIC
Bilirubin Urine: NEGATIVE
Glucose, UA: NEGATIVE mg/dL
Hgb urine dipstick: NEGATIVE
KETONES UR: 5 mg/dL — AB
Nitrite: NEGATIVE
PROTEIN: NEGATIVE mg/dL
Specific Gravity, Urine: 1.024 (ref 1.005–1.030)
pH: 7 (ref 5.0–8.0)

## 2018-04-16 LAB — WET PREP, GENITAL
CLUE CELLS WET PREP: NONE SEEN
Sperm: NONE SEEN
TRICH WET PREP: NONE SEEN
YEAST WET PREP: NONE SEEN

## 2018-04-16 LAB — I-STAT BETA HCG BLOOD, ED (MC, WL, AP ONLY)

## 2018-04-16 MED ORDER — CEFTRIAXONE SODIUM 250 MG IJ SOLR
250.0000 mg | Freq: Once | INTRAMUSCULAR | Status: AC
  Administered 2018-04-16: 250 mg via INTRAMUSCULAR
  Filled 2018-04-16: qty 250

## 2018-04-16 MED ORDER — LIDOCAINE HCL 1 % IJ SOLN
INTRAMUSCULAR | Status: AC
  Administered 2018-04-16: 0.9 mL
  Filled 2018-04-16: qty 20

## 2018-04-16 MED ORDER — AZITHROMYCIN 250 MG PO TABS
1000.0000 mg | ORAL_TABLET | Freq: Once | ORAL | Status: AC
  Administered 2018-04-16: 1000 mg via ORAL
  Filled 2018-04-16: qty 4

## 2018-04-16 MED ORDER — VALACYCLOVIR HCL 500 MG PO TABS: 500.0000 mg | ORAL_TABLET | Freq: Two times a day (BID) | ORAL | 0 refills | Status: AC

## 2018-04-16 NOTE — Discharge Instructions (Signed)
You were treated prophylactically today for gonorrhea and chlamydia.  You will need to take Valtrex twice daily for the next 3 days to treat genital herpes.  You have STD testing pending will be contacted in about 3 days if anything comes back positive.  Please follow-up with your gynecologist to ensure herpes is improving.  Please avoid sexual activity until outbreak has cleared up, make sure you are using protection.  Please return to the ED for worsening pelvic pain, abdominal pain, nausea, vomiting, fevers, vaginal bleeding or any other new or concerning symptoms.

## 2018-04-16 NOTE — ED Provider Notes (Addendum)
[facility]-EMERGENCY DEPT Provider Note   CSN: [id number] Arrival date & time: 04/16/18  1806     History   Chief Complaint Chief Complaint  Patient presents with  . Pelvic Pain    HPI Kaitlin Montgomery is a 22 y.o. female.  Kaitlin Montgomery is a 22 y.o. Female with a history of anxiety, depression, bronchitis and seasonal allergies, presents to the emergency department for evaluation of painful rash over the vagina and labia as well as vaginal discharge.  Patient reports rash has been present for the past 6 days she describes it as lots of red bumps over the labia that are painful to the touch, patient expresses concern that this could be herpes.  Patient also reports for the past 2 to 3 days she has been having whitish discharge, this is more than her usual discharge.  She thought this might of been a yeast infection took some Monistat last night which did not really improve anything.  Patient reports she has been in a relationship for the past several months, and had negative STD testing back in March, but is concerned the symptoms came on about 6 days ago after her most recent sexual encounter.  Patient reports vaginal discomfort, but denies abdominal pain, nausea, vomiting, fevers.  Patient denies any urinary symptoms just reports of burning over the labia where the lesions are when they come in contact with urine.  Patient is not currently on any birth control, she is currently breast-feeding last menstrual period was 04/06/2018.     Past Medical History:  Diagnosis Date  . Anxiety 06/10/2015  . Bronchitis   . Bronchitis   . Depressed mood 07/01/2015  . 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 Keloid 02/11/2015  . Seasonal allergies 04/09/2015    Patient Active Problem List   Diagnosis Date Noted  . Gall bladder polyp 12/30/2016    Past Surgical History:  Procedure Laterality Date  . EXTERNAL EAR SURGERY Right      OB History    Gravida  1   Para  1   Term  1   Preterm      AB  0   Living  1     SAB  0   TAB      Ectopic      Multiple  0   Live Births  1            Home Medications    Prior to Admission medications   Medication Sig Start Date End Date Taking? Authorizing Provider  ibuprofen (ADVIL,MOTRIN) 600 MG tablet Take 1 tablet (600 mg total) by mouth every 6 (six) hours. Patient not taking: Reported on 02/01/2018 04/23/17   Allie Bossier, MD  polyethylene glycol powder (MIRALAX) powder Take 1-2 caps daily as needed for constipation. Patient not taking: Reported on 02/01/2018 04/28/17   Caccavale, Sophia, PA-C  Prenatal-DSS-FeCb-FeGl-FA (CITRANATAL BLOOM) 90-1 MG TABS Take 1 tablet by mouth daily. Patient not taking: Reported on 02/01/2018 02/11/17   Orvilla Cornwall A, CNM  terconazole (TERAZOL 3) 0.8 % vaginal cream Place 1 applicator vaginally at bedtime. 02/01/18   Roe Coombs, CNM    Family History Family History  Problem Relation Age of Onset  . Cancer Maternal Grandmother     Social History Social History   Tobacco Use  . Smoking status: Former Smoker    Types: Cigars    Last attempt to quit: 08/25/2016    Years since quitting: 1.6  . Smokeless tobacco:  Never Used  Substance Use Topics  . Alcohol use: No    Alcohol/week: 0.0 oz  . Drug use: No     Allergies   Patient has no known allergies.   Review of Systems Review of Systems  Constitutional: Negative for chills and fever.  HENT: Negative for congestion, rhinorrhea and sore throat.   Eyes: Negative for visual disturbance.  Respiratory: Negative for shortness of breath.   Cardiovascular: Negative for chest pain.  Gastrointestinal: Negative for abdominal pain, nausea and vomiting.  Genitourinary: Positive for genital sores, vaginal discharge and vaginal pain. Negative for dysuria, frequency and vaginal bleeding.  Musculoskeletal: Negative for arthralgias and joint swelling.  Skin: Negative for color change and rash.    Neurological: Negative for dizziness, syncope and light-headedness.     Physical Exam Updated Vital Signs BP 132/88   Pulse (!) 114   Temp 98.7 F (37.1 C) (Oral)   Resp 18   LMP 04/06/2018   SpO2 100%   Physical Exam  Constitutional: She appears well-developed and well-nourished. No distress.  HENT:  Head: Normocephalic and atraumatic.  Mouth/Throat: Oropharynx is clear and moist.  Eyes: Right eye exhibits no discharge. Left eye exhibits no discharge.  Neck: Neck supple.  Cardiovascular: Normal rate, regular rhythm, normal heart sounds and intact distal pulses.  Pulmonary/Chest: Effort normal and breath sounds normal. No stridor. No respiratory distress. She has no wheezes. She has no rales.  Respirations equal and unlabored, patient able to speak in full sentences, lungs clear to auscultation bilaterally  Abdominal: Soft. Bowel sounds are normal. She exhibits no distension and no mass. There is no tenderness. There is no guarding.  Abdomen soft, nondistended, nontender to palpation in all quadrants without guarding or peritoneal signs  Genitourinary:  Genitourinary Comments: Chaperone present during genital exam Multiple small vesicular lesions over the external labia, several lesions over the internal labia as well many of which appear to be unroofed and are painful to the touch, no purulent drainage, some surrounding erythema. Speculum exam shows greenish-yellow discharge present in the vaginal vault and coming from the cervix with evidence of cervicitis.  On bimanual exam there is no cervical motion tenderness, no adnexal tenderness or masses.  Neurological: She is alert. Coordination normal.  Skin: Skin is warm and dry. Capillary refill takes less than 2 seconds. She is not diaphoretic.  Psychiatric: She has a normal mood and affect. Her behavior is normal.  Nursing note and vitals reviewed.    ED Treatments / Results  Labs (all labs ordered are listed, but only  abnormal results are displayed) Labs Reviewed  WET PREP, GENITAL - Abnormal; Notable for the following components:      Result Value   WBC, Wet Prep HPF POC MANY (*)    All other components within normal limits  URINALYSIS, ROUTINE W REFLEX MICROSCOPIC - Abnormal; Notable for the following components:   APPearance CLOUDY (*)    Ketones, ur 5 (*)    Leukocytes, UA LARGE (*)    Bacteria, UA RARE (*)    All other components within normal limits  HSV CULTURE AND TYPING  RPR  HIV ANTIBODY (ROUTINE TESTING)  I-STAT BETA HCG BLOOD, ED ([facility], [facility], [facility] ONLY)  GC/CHLAMYDIA PROBE AMP ([place]) NOT AT [facility]    EKG None  Radiology No results found.  Procedures Procedures (including critical care time)  Medications Ordered in ED Medications  cefTRIAXone (ROCEPHIN) injection 250 mg (250 mg Intramuscular Given 04/16/18 2145)  azithromycin (ZITHROMAX) tablet 1,000  mg (1,000 mg Oral Given 04/16/18 2146)  lidocaine (XYLOCAINE) 1 % (with pres) injection (0.9 mLs  Given 04/16/18 2146)     Initial Impression / Assessment and Plan / ED Course  I have reviewed the triage vital signs and the nursing notes.  Pertinent labs & imaging results that were available during my care of the patient were reviewed by me and considered in my medical decision making (see chart for details).  Patient presents for evaluation of painful rash over the labia and vagina, as well as vaginal discharge.  Patient is tachycardic on arrival, but appears very anxious and upset regarding vaginal rash and discharge.  This improved with reassurance.  Exam consistent with genital herpes.  Patient also has yellow-green discharge and evidence of cervicitis, exam is not concerning for PID.  Patient is currently sexually active and does not use regularly use protection.  Wet prep shows white blood cells.  GC chlamydia, RPR, HIV as well as HSV culture are all pending.  Will treat with azithromycin and ceftriaxone here in the ED and prescribed  Valtrex for genital herpes.  Encourage patient to avoid sexual activity until outbreak has resolved.  Patient to follow-up with her gynecologist.  Strict return precautions discussed.  Final Clinical Impressions(s) / ED Diagnoses   Final diagnoses:  Vaginal discharge  Herpes simplex vulvovaginitis    ED Discharge Orders        Ordered    valACYclovir (VALTREX) 500 MG tablet  2 times daily     04/16/18 2126       Dartha LodgeFord, Chenel Wernli N, PA-C 04/16/18 2259    Dartha LodgeFord, Kerrick Miler N, PA-C 04/16/18 2336    Tegeler, Canary Brimhristopher J, MD 04/17/18 [phone]

## 2018-04-16 NOTE — ED Notes (Signed)
Pelvic cart at bedside. 

## 2018-04-16 NOTE — ED Triage Notes (Signed)
Patient c/o painful rash to vagina, white vaginal discharge, and pelvic pain x6 days. Reports "I think it could be herpes."

## 2018-04-17 LAB — HIV ANTIBODY (ROUTINE TESTING W REFLEX): HIV SCREEN 4TH GENERATION: NONREACTIVE

## 2018-04-17 LAB — RPR: RPR: NONREACTIVE

## 2018-04-18 LAB — GC/CHLAMYDIA PROBE AMP (~~LOC~~) NOT AT ARMC
Chlamydia: NEGATIVE
Neisseria Gonorrhea: NEGATIVE

## 2018-04-19 LAB — HSV CULTURE AND TYPING

## 2018-08-25 ENCOUNTER — Other Ambulatory Visit (HOSPITAL_COMMUNITY)
Admission: RE | Admit: 2018-08-25 | Discharge: 2018-08-25 | Disposition: A | Discharge status: Home / Self Care | Payer: Medicaid Other | Source: Ambulatory Visit | Attending: Certified Nurse Midwife | Admitting: Certified Nurse Midwife

## 2018-08-25 ENCOUNTER — Ambulatory Visit (INDEPENDENT_AMBULATORY_CARE_PROVIDER_SITE_OTHER): Payer: Medicaid Other | Admitting: Certified Nurse Midwife

## 2018-08-25 ENCOUNTER — Encounter: Payer: Self-pay | Admitting: Certified Nurse Midwife

## 2018-08-25 VITALS — BP 115/68 | HR 84 | Ht 68.0 in | Wt 159.9 lb

## 2018-08-25 DIAGNOSIS — N898 Other specified noninflammatory disorders of vagina: Secondary | ICD-10-CM

## 2018-08-25 DIAGNOSIS — Z113 Encounter for screening for infections with a predominantly sexual mode of transmission: Secondary | ICD-10-CM

## 2018-08-25 DIAGNOSIS — B9689 Other specified bacterial agents as the cause of diseases classified elsewhere: Secondary | ICD-10-CM | POA: Diagnosis not present

## 2018-08-25 DIAGNOSIS — N760 Acute vaginitis: Secondary | ICD-10-CM | POA: Diagnosis not present

## 2018-08-25 NOTE — Patient Instructions (Signed)
Sexually Transmitted Disease  A sexually transmitted disease (STD) is a disease or infection that may be passed (transmitted) from person to person, usually during sexual activity. This may happen by way of saliva, semen, blood, vaginal mucus, or urine. Common STDs include:   Gonorrhea.   Chlamydia.   Syphilis.   HIV and AIDS.   Genital herpes.   Hepatitis B and C.   Trichomonas.   Human papillomavirus (HPV).   Pubic lice.   Scabies.   Mites.   Bacterial vaginosis.    What are the causes?  An STD may be caused by bacteria, a virus, or parasites. STDs are often transmitted during sexual activity if one person is infected. However, they may also be transmitted through nonsexual means. STDs may be transmitted after:   Sexual intercourse with an infected person.   Sharing sex toys with an infected person.   Sharing needles with an infected person or using unclean piercing or tattoo needles.   Having intimate contact with the genitals, mouth, or rectal areas of an infected person.   Exposure to infected fluids during birth.    What are the signs or symptoms?  Different STDs have different symptoms. Some people may not have any symptoms. If symptoms are present, they may include:   Painful or bloody urination.   Pain in the pelvis, abdomen, vagina, anus, throat, or eyes.   A skin rash, itching, or irritation.   Growths, ulcerations, blisters, or sores in the genital and anal areas.   Abnormal vaginal discharge with or without bad odor.   Penile discharge in men.   Fever.   Pain or bleeding during sexual intercourse.   Swollen glands in the groin area.   Yellow skin and eyes (jaundice). This is seen with hepatitis.   Swollen testicles.   Infertility.   Sores and blisters in the mouth.    How is this diagnosed?  To make a diagnosis, your health care provider may:   Take a medical history.   Perform a physical exam.   Take a sample of any discharge to examine.   Swab the throat, cervix,  opening to the penis, rectum, or vagina for testing.   Test a sample of your first morning urine.   Perform blood tests.   Perform a Pap test, if this applies.   Perform a colposcopy.   Perform a laparoscopy.    How is this treated?  Treatment depends on the STD. Some STDs may be treated but not cured.   Chlamydia, gonorrhea, trichomonas, and syphilis can be cured with antibiotic medicine.   Genital herpes, hepatitis, and HIV can be treated, but not cured, with prescribed medicines. The medicines lessen symptoms.   Genital warts from HPV can be treated with medicine or by freezing, burning (electrocautery), or surgery. Warts may come back.   HPV cannot be cured with medicine or surgery. However, abnormal areas may be removed from the cervix, vagina, or vulva.   If your diagnosis is confirmed, your recent sexual partners need treatment. This is true even if they are symptom-free or have a negative culture or evaluation. They should not have sex until their health care providers say it is okay.   Your health care provider may test you for infection again 3 months after treatment.    How is this prevented?  Take these steps to reduce your risk of getting an STD:   Use latex condoms, dental dams, and water-soluble lubricants during sexual activity. Do not use   petroleum jelly or oils.   Avoid having multiple sex partners.   Do not have sex with someone who has other sex partners.   Do not have sex with anyone you do not know or who is at high risk for an STD.   Avoid risky sex practices that can break your skin.   Do not have sex if you have open sores on your mouth or skin.   Avoid drinking too much alcohol or taking illegal drugs. Alcohol and drugs can affect your judgment and put you in a vulnerable position.   Avoid engaging in oral and anal sex acts.   Get vaccinated for HPV and hepatitis. If you have not received these vaccines in the past, talk to your health care provider about whether one or  both might be right for you.   If you are at risk of being infected with HIV, it is recommended that you take a prescription medicine daily to prevent HIV infection. This is called pre-exposure prophylaxis (PrEP). You are considered at risk if:  ? You are a man who has sex with other men (MSM).  ? You are a heterosexual man or woman and are sexually active with more than one partner.  ? You take drugs by injection.  ? You are sexually active with a partner who has HIV.   Talk with your health care provider about whether you are at high risk of being infected with HIV. If you choose to begin PrEP, you should first be tested for HIV. You should then be tested every 3 months for as long as you are taking PrEP.    Contact a health care provider if:   See your health care provider.   Tell your sexual partner(s). They should be tested and treated for any STDs.   Do not have sex until your health care provider says it is okay.  Get help right away if:  Contact your health care provider right away if:   You have severe abdominal pain.   You are a man and notice swelling or pain in your testicles.   You are a woman and notice swelling or pain in your vagina.    This information is not intended to replace advice given to you by your health care provider. Make sure you discuss any questions you have with your health care provider.  Document Released: 01/16/2003 Document Revised: 05/15/2016 Document Reviewed: 05/16/2013  Elsevier Interactive Patient Education  2018 [employer]

## 2018-08-25 NOTE — Progress Notes (Signed)
Presents for STD check, FOB burns whenever he urinates.  C/o malodorous discharge, intermittent lower abdominal pain 4/10 x 4 days.  NV, lightheadedness. Denies burning or urinary problems.

## 2018-08-25 NOTE — Progress Notes (Signed)
GYNECOLOGY PROBLEM VISIT ENCOUNTER NOTE  Subjective:   Kaitlin Montgomery is a 22 y.o. G57P1001 female here for a problem visit.  Current complaints: vaginal discharge and STD screening. Denies abnormal vaginal bleeding, pelvic pain, problems with intercourse or other gynecologic concerns. She reports occasional lower abdominal cramping and nausea but believes that is due to her about to have menstrual cycle. Pt request STD screening.    Gynecologic History No LMP recorded. (Menstrual status: Lactating). Contraception: none  Obstetric History OB History  Gravida Para Term Preterm AB Living  1 1 1    0 1  SAB TAB Ectopic Multiple Live Births  0     0 1    # Outcome Date GA Lbr Len/2nd Weight Sex Delivery Anes PTL Lv  1 Term 04/21/17 [redacted]w[redacted]d 05:41 / 00:48 7 lb 5.1 oz (3.32 kg) F Vag-Spont EPI  LIV    Past Medical History:  Diagnosis Date  . Anxiety 06/10/2015  . Bronchitis   . Bronchitis   . Depressed mood 07/01/2015  . 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 Keloid 02/11/2015  . Seasonal allergies 04/09/2015    Past Surgical History:  Procedure Laterality Date  . EXTERNAL EAR SURGERY Right     Current Outpatient Medications on File Prior to Visit  Medication Sig Dispense Refill  . acetaminophen (TYLENOL) 500 MG tablet Take 500 mg by mouth as needed for headache.    . ibuprofen (ADVIL,MOTRIN) 600 MG tablet Take 1 tablet (600 mg total) by mouth every 6 (six) hours. (Patient not taking: Reported on 02/01/2018) 30 tablet 0  . miconazole (MICOTIN) 2 % cream Apply 1 application topically daily.    . polyethylene glycol powder (MIRALAX) powder Take 1-2 caps daily as needed for constipation. (Patient not taking: Reported on 02/01/2018) 255 g 0  . Prenatal-DSS-FeCb-FeGl-FA (CITRANATAL BLOOM) 90-1 MG TABS Take 1 tablet by mouth daily. (Patient not taking: Reported on 02/01/2018) 30 tablet 12  . terconazole (TERAZOL 3) 0.8 % vaginal cream Place 1 applicator vaginally at bedtime. (Patient not  taking: Reported on 08/25/2018) 20 g 0   No current facility-administered medications on file prior to visit.     No Known Allergies  Social History:  reports that she quit smoking about 2 years ago. Her smoking use included cigars. She has quit using smokeless tobacco. She reports that she does not drink alcohol or use drugs.  Family History  Problem Relation Age of Onset  . Cancer Maternal Grandmother    The following portions of the patient's history were reviewed and updated as appropriate: allergies, current medications, past family history, past medical history, past social history, past surgical history and problem list.  Review of Systems Pertinent items noted in HPI and remainder of comprehensive ROS otherwise negative.   Objective:  BP 115/68   Pulse 84   Ht 5\' 8"  (1.727 m)   Wt 159 lb 14.4 oz (72.5 kg)   Breastfeeding? Yes   BMI 24.31 kg/m  CONSTITUTIONAL: Well-developed, well-nourished female in no acute distress.  HENT:  Normocephalic, atraumatic, External right and left ear normal. Oropharynx is clear and moist EYES: Conjunctivae and EOM are normal. Pupils are equal, round, and reactive to light. SKIN: Skin is warm and dry. No rash noted. Not diaphoretic. No erythema. No pallor. NEUROLOGIC: Alert and oriented to person, place, and time. Normal reflexes, muscle tone coordination. No cranial nerve deficit noted. PSYCHIATRIC: Normal mood and affect. Normal behavior. Normal judgment and thought content. CARDIOVASCULAR: Normal heart rate noted, regular rhythm  RESPIRATORY: Clear to auscultation bilaterally. Effort and breath sounds normal, no problems with respiration noted. ABDOMEN: Soft, normal bowel sounds, no distention noted.  No tenderness, rebound or guarding.  PELVIC: Normal appearing external genitalia. No lesions noted on external genitalia. Small amount of white discharge without odor noted.  Cervicovaginal ancillary swab obtained.  Normal uterine size, no other  palpable masses, no uterine or adnexal tenderness.  Assessment and Plan:  1. Vaginal discharge - Reports discharge has been present for 1 month since last IC with FOB.  - Describes discharge as whitish clear discharge with foul odor, denies using protection during IC  - Denies AUB or urinary symptoms  - Later found out FOB stating her was "burning with urination" which now has her concerned.  - Cervicovaginal ancillary only( [place])  2. Screening for STD (sexually transmitted disease) - HIV Antibody (routine testing w rflx) - RPR  Will follow up results of testing and manage accordingly. Routine preventative health maintenance measures emphasized. Please refer to After Visit Summary for other counseling recommendations.   Sharyon Cable, CNM [facility] [employer] [facility]

## 2018-08-26 LAB — CERVICOVAGINAL ANCILLARY ONLY
Bacterial vaginitis: POSITIVE — AB
Candida vaginitis: NEGATIVE
Chlamydia: NEGATIVE
Neisseria Gonorrhea: NEGATIVE
Trichomonas: NEGATIVE

## 2018-08-26 LAB — HIV ANTIBODY (ROUTINE TESTING W REFLEX): HIV Screen 4th Generation wRfx: NONREACTIVE

## 2018-08-26 LAB — RPR: RPR Ser Ql: NONREACTIVE

## 2018-08-28 MED ORDER — METRONIDAZOLE 500 MG PO TABS: 500.0000 mg | ORAL_TABLET | Freq: Two times a day (BID) | ORAL | 0 refills | Status: DC

## 2018-08-28 NOTE — Addendum Note (Signed)
Addended by: Sharyon Cable on: 08/28/2018 11:07 PM   Modules accepted: Orders

## 2019-02-28 DIAGNOSIS — Z3A41 41 weeks gestation of pregnancy: Secondary | ICD-10-CM

## 2019-02-28 DIAGNOSIS — O480 Post-term pregnancy: Secondary | ICD-10-CM

## 2019-04-29 ENCOUNTER — Inpatient Hospital Stay (HOSPITAL_COMMUNITY): Payer: Medicaid Other

## 2019-04-29 ENCOUNTER — Encounter (HOSPITAL_COMMUNITY): Payer: Self-pay

## 2019-04-29 ENCOUNTER — Inpatient Hospital Stay (HOSPITAL_COMMUNITY)
Admission: AD | Admit: 2019-04-29 | Discharge: 2019-04-29 | Disposition: A | Discharge status: Home / Self Care | Payer: Medicaid Other | Attending: Obstetrics and Gynecology | Admitting: Obstetrics and Gynecology

## 2019-04-29 ENCOUNTER — Other Ambulatory Visit: Payer: Self-pay

## 2019-04-29 DIAGNOSIS — Z3A01 Less than 8 weeks gestation of pregnancy: Secondary | ICD-10-CM | POA: Diagnosis not present

## 2019-04-29 DIAGNOSIS — O98511 Other viral diseases complicating pregnancy, first trimester: Secondary | ICD-10-CM | POA: Insufficient documentation

## 2019-04-29 DIAGNOSIS — O23591 Infection of other part of genital tract in pregnancy, first trimester: Secondary | ICD-10-CM

## 2019-04-29 DIAGNOSIS — B9689 Other specified bacterial agents as the cause of diseases classified elsewhere: Secondary | ICD-10-CM

## 2019-04-29 DIAGNOSIS — O208 Other hemorrhage in early pregnancy: Secondary | ICD-10-CM | POA: Diagnosis not present

## 2019-04-29 DIAGNOSIS — O200 Threatened abortion: Secondary | ICD-10-CM | POA: Diagnosis not present

## 2019-04-29 DIAGNOSIS — O209 Hemorrhage in early pregnancy, unspecified: Secondary | ICD-10-CM

## 2019-04-29 DIAGNOSIS — N760 Acute vaginitis: Secondary | ICD-10-CM | POA: Diagnosis not present

## 2019-04-29 DIAGNOSIS — O98811 Other maternal infectious and parasitic diseases complicating pregnancy, first trimester: Secondary | ICD-10-CM

## 2019-04-29 LAB — WET PREP, GENITAL
Sperm: NONE SEEN
Trich, Wet Prep: NONE SEEN
Yeast Wet Prep HPF POC: NONE SEEN

## 2019-04-29 LAB — HCG, QUANTITATIVE, PREGNANCY: hCG, Beta Chain, Quant, S: 9351 m[IU]/mL — ABNORMAL HIGH (ref <5)

## 2019-04-29 LAB — URINALYSIS, ROUTINE W REFLEX MICROSCOPIC
Bacteria, UA: NONE SEEN
Bilirubin Urine: NEGATIVE
Glucose, UA: NEGATIVE mg/dL
Ketones, ur: NEGATIVE mg/dL
Leukocytes,Ua: NEGATIVE
Nitrite: NEGATIVE
Protein, ur: NEGATIVE mg/dL
Specific Gravity, Urine: 1.028 (ref 1.005–1.030)
pH: 6 (ref 5.0–8.0)

## 2019-04-29 LAB — CBC
HCT: 37.6 % (ref 36.0–46.0)
Hemoglobin: 12 g/dL (ref 12.0–15.0)
MCH: 28.3 pg (ref 26.0–34.0)
MCHC: 31.9 g/dL (ref 30.0–36.0)
MCV: 88.7 fL (ref 80.0–100.0)
Platelets: 202 10*3/uL (ref 150–400)
RBC: 4.24 MIL/uL (ref 3.87–5.11)
RDW: 12.4 % (ref 11.5–15.5)
WBC: 9 10*3/uL (ref 4.0–10.5)
nRBC: 0 % (ref 0.0–0.2)

## 2019-04-29 LAB — POCT PREGNANCY, URINE: Preg Test, Ur: POSITIVE — AB

## 2019-04-29 MED ORDER — METRONIDAZOLE 500 MG PO TABS: 500.0000 mg | ORAL_TABLET | Freq: Two times a day (BID) | ORAL | 0 refills | Status: DC

## 2019-04-29 NOTE — MAU Note (Addendum)
Patient presents to MAU via EMS.  Patient reports vaginal spotting that started 04/28/2019 and today she reports heavier bleeding and clots. Patient reports 3 pad changes in 9 hours.  Patient reports cramps, rates pain 6/10.  Patient reports taking Tylenol around 1945.  Patient states heavy bleeding stopped after she took a shower around 1900 , "now it's just spotting"

## 2019-04-29 NOTE — MAU Provider Note (Addendum)
History     CSN: [id number]  Arrival date and time: 04/29/19 2051   First Provider Initiated Contact with Patient 04/29/19 2135      Chief Complaint  Patient presents with  . Vaginal Bleeding   Kaitlin Montgomery is a 23 y.o. G2P1001 at 3369w3d by Definite LMP of 03/08/2019.  She presents today for Vaginal Bleeding.  She states she thinks she is having a miscarriage because she took a pregnancy test and it was positive.  She reports that she started spotting two days ago and it increased yesterday.  She states that today she is wearing an overnight pad and continues to bleed. She reports intermittent cramping in her pelvis and has taken tylenol with relief.  She rates the pain a 6/10 currently.  Patient reports that she does not desire pregnancy and would like an IUD if she is having a miscarriage.      OB History    Gravida  2   Para  1   Term  1   Preterm      AB  0   Living  1     SAB  0   TAB      Ectopic      Multiple  0   Live Births  1           Past Medical History:  Diagnosis Date  . Anxiety 06/10/2015  . Bronchitis   . Bronchitis   . Depressed mood 07/01/2015  . 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 Keloid 02/11/2015  . Seasonal allergies 04/09/2015    Past Surgical History:  Procedure Laterality Date  . EXTERNAL EAR SURGERY Right     Family History  Problem Relation Age of Onset  . Cancer Maternal Grandmother     Social History   Tobacco Use  . Smoking status: Former Smoker    Types: Cigars    Quit date: 08/25/2016    Years since quitting: 2.6  . Smokeless tobacco: Former Engineer, waterUser  Substance Use Topics  . Alcohol use: No    Alcohol/week: 0.0 standard drinks  . Drug use: No    Allergies: No Known Allergies  Medications Prior to Admission  Medication Sig Dispense Refill Last Dose  . acetaminophen (TYLENOL) 500 MG tablet Take 500 mg by mouth as needed for headache.   04/29/2019 at Unknown time  . ibuprofen (ADVIL,MOTRIN) 600 MG tablet Take  1 tablet (600 mg total) by mouth every 6 (six) hours. (Patient not taking: Reported on 02/01/2018) 30 tablet 0   . metroNIDAZOLE (FLAGYL) 500 MG tablet Take 1 tablet (500 mg total) by mouth 2 (two) times daily. 14 tablet 0   . miconazole (MICOTIN) 2 % cream Apply 1 application topically daily.     . polyethylene glycol powder (MIRALAX) powder Take 1-2 caps daily as needed for constipation. (Patient not taking: Reported on 02/01/2018) 255 g 0   . Prenatal-DSS-FeCb-FeGl-FA (CITRANATAL BLOOM) 90-1 MG TABS Take 1 tablet by mouth daily. (Patient not taking: Reported on 02/01/2018) 30 tablet 12   . terconazole (TERAZOL 3) 0.8 % vaginal cream Place 1 applicator vaginally at bedtime. (Patient not taking: Reported on 08/25/2018) 20 g 0     Review of Systems  Constitutional: Negative for chills and fever.  Respiratory: Negative for cough and shortness of breath.   Gastrointestinal: Positive for nausea. Negative for constipation, diarrhea and vomiting.  Genitourinary: Positive for pelvic pain (Cramps), vaginal bleeding and vaginal discharge. Negative for difficulty urinating and dysuria.  Musculoskeletal: Positive  for back pain and myalgias.  Neurological: Negative for dizziness, light-headedness and headaches.   Physical Exam   Blood pressure 117/69, pulse 86, temperature 98.8 F (37.1 C), temperature source Oral, resp. rate 18, height 5\' 8"  (1.727 m), weight 79.8 kg, last menstrual period 03/08/2019, SpO2 100 %, currently breastfeeding.  Physical Exam  Constitutional: She is oriented to person, place, and time. She appears well-developed and well-nourished. No distress.  HENT:  Head: Normocephalic and atraumatic.  Eyes: Conjunctivae are normal.  Neck: Normal range of motion.  Cardiovascular: Normal rate, regular rhythm and normal heart sounds.  Respiratory: Effort normal and breath sounds normal.  GI: Soft. Bowel sounds are normal. There is no abdominal tenderness.  Genitourinary: Uterus is  enlarged. Cervix exhibits discharge (Bloody).    Vaginal bleeding present.  There is bleeding in the vagina.    Genitourinary Comments:  Speculum Exam: -Vaginal Vault: Pink mucosa.  Small amt blood, malodor apparent -wet prep collected -Cervix:Pink, no lesions, cysts, or polyps.  Appears open. No active bleeding from os-GC/CT collected -Bimanual Exam: Closed No tenderness    Musculoskeletal: Normal range of motion.  Neurological: She is alert and oriented to person, place, and time.  Skin: Skin is warm and dry.  Psychiatric: She has a normal mood and affect. Her behavior is normal.    MAU Course  Procedures Results for orders placed or performed during the hospital encounter of 04/29/19 (from the past 24 hour(s))  Pregnancy, urine POC     Status: Abnormal   Collection Time: 04/29/19  9:17 PM  Result Value Ref Range   Preg Test, Ur POSITIVE (A) NEGATIVE  Urinalysis, Routine w reflex microscopic     Status: Abnormal   Collection Time: 04/29/19  9:27 PM  Result Value Ref Range   Color, Urine YELLOW YELLOW   APPearance CLEAR CLEAR   Specific Gravity, Urine 1.028 1.005 - 1.030   pH 6.0 5.0 - 8.0   Glucose, UA NEGATIVE NEGATIVE mg/dL   Hgb urine dipstick LARGE (A) NEGATIVE   Bilirubin Urine NEGATIVE NEGATIVE   Ketones, ur NEGATIVE NEGATIVE mg/dL   Protein, ur NEGATIVE NEGATIVE mg/dL   Nitrite NEGATIVE NEGATIVE   Leukocytes,Ua NEGATIVE NEGATIVE   RBC / HPF 0-5 0 - 5 RBC/hpf   WBC, UA 0-5 0 - 5 WBC/hpf   Bacteria, UA NONE SEEN NONE SEEN   Squamous Epithelial / LPF 6-10 0 - 5   Mucus PRESENT   Wet prep, genital     Status: Abnormal   Collection Time: 04/29/19  9:55 PM  Result Value Ref Range   Yeast Wet Prep HPF POC NONE SEEN NONE SEEN   Trich, Wet Prep NONE SEEN NONE SEEN   Clue Cells Wet Prep HPF POC PRESENT (A) NONE SEEN   WBC, Wet Prep HPF POC MANY (A) NONE SEEN   Sperm NONE SEEN    [place] Ob Less Than 14 Weeks With Ob Transvaginal  Result Date: 04/29/2019 CLINICAL DATA:  23  y/o  F; bleeding and cramping. EXAM: OBSTETRIC <14 WK US AND TRANSVAGINAL OB US TECHNIQUE: Both transabdominal and transvaginal ultrasound examinations were performed for complete evaluation of the gestation as well as the maternal uterus, adnexal regions, and pelvic cul-de-sac. Transvaginal technique was performed to assess early pregnancy. COMPARISON:  None. FINDINGS: Intrauterine gestational sac: Single Yolk sac:  Visualized. Embryo:  Visualized. Cardiac Activity: Visualized. Heart Rate: 124 bpm CRL: 6.2 mm   6 w   3 d  [place] EDC: 12/20/2019 Subchorionic hemorrhage:  None visualized. Maternal uterus/adnexae: Normal appearance of adnexa and ovaries. Right corpus luteum cyst. Retroverted uterus. No pelvic free fluid. IMPRESSION: Single live intrauterine pregnancy. Estimated gestational age [redacted] weeks 3 days. Electronically Signed   By: Kristine Garbe M.D.   On: 04/29/2019 23:08     MDM Pelvic Exam with cultures Labs: UA, hCG, CBC, Wet prep, and GC/CT Assessment and Plan  23 year old  7.3 weeks by LMP Vaginal Bleeding  -Reassured that exercise is not cause for miscarriage if it is occurring. -Discussed IUDs for [facility] if having miscarriage. -Exam performed and findings discussed.  -Informed that likely bacterial infection and bleeding c/w [facility], but would wait for results to give definite diagnosis.  -Labs collected -Offered and declines pain medication -Will send for [place]   Follow Up (11:24 PM) Bacterial Vaginosis IUP at 6.3 weeks Threatened Abortion  -Wet prep returns significant for clue cells -Results discussed with patient. -Rx for Metronidazole 500mg  BID x 7 days, Disp 14, RF 0 sent to pharmacy on file.  -Informed of US findings and congratulations given. -Patient expresses surprise and excitement. -Discussed modification of EDD from 2/3 to 2/10 based on US findings. -No questions or concerns.  -Instructed to find provider and initiate [place]. -Discussed how [place]  still highly recommended despite Co-Vid and informed of virtual appts. -Encouraged to call or return to MAU if symptoms worsen or with the onset of new symptoms. -Discharged to home in stable condition  Maryann Conners MSN, CNM 04/29/2019, 9:35 PM

## 2019-04-29 NOTE — Discharge Instructions (Signed)
Bacterial Vaginosis    Bacterial vaginosis is a vaginal infection that occurs when the normal balance of bacteria in the vagina is disrupted. It results from an overgrowth of certain bacteria. This is the most common vaginal infection among women ages 15-44.  Because bacterial vaginosis increases your risk for STIs (sexually transmitted infections), getting treated can help reduce your risk for chlamydia, gonorrhea, herpes, and HIV (human immunodeficiency virus). Treatment is also important for preventing complications in pregnant women, because this condition can cause an early (premature) delivery.  What are the causes?  This condition is caused by an increase in harmful bacteria that are normally present in small amounts in the vagina. However, the reason that the condition develops is not fully understood.  What increases the risk?  The following factors may make you more likely to develop this condition:  · Having a new sexual partner or multiple sexual partners.  · Having unprotected sex.  · Douching.  · Having an intrauterine device (IUD).  · Smoking.  · Drug and alcohol abuse.  · Taking certain antibiotic medicines.  · Being pregnant.  You cannot get bacterial vaginosis from toilet seats, bedding, swimming pools, or contact with objects around you.  What are the signs or symptoms?  Symptoms of this condition include:  · Grey or white vaginal discharge. The discharge can also be watery or foamy.  · A fish-like odor with discharge, especially after sexual intercourse or during menstruation.  · Itching in and around the vagina.  · Burning or pain with urination.  Some women with bacterial vaginosis have no signs or symptoms.  How is this diagnosed?  This condition is diagnosed based on:  · Your medical history.  · A physical exam of the vagina.  · Testing a sample of vaginal fluid under a microscope to look for a large amount of bad bacteria or abnormal cells. Your health care provider may use a cotton swab or  a small wooden spatula to collect the sample.  How is this treated?  This condition is treated with antibiotics. These may be given as a pill, a vaginal cream, or a medicine that is put into the vagina (suppository). If the condition comes back after treatment, a second round of antibiotics may be needed.  Follow these instructions at home:  Medicines  · Take over-the-counter and prescription medicines only as told by your health care provider.  · Take or use your antibiotic as told by your health care provider. Do not stop taking or using the antibiotic even if you start to feel better.  General instructions  · If you have a female sexual partner, tell her that you have a vaginal infection. She should see her health care provider and be treated if she has symptoms. If you have a female sexual partner, he does not need treatment.  · During treatment:  ? Avoid sexual activity until you finish treatment.  ? Do not douche.  ? Avoid alcohol as directed by your health care provider.  ? Avoid breastfeeding as directed by your health care provider.  · Drink enough water and fluids to keep your urine clear or pale yellow.  · Keep the area around your vagina and rectum clean.  ? Wash the area daily with warm water.  ? Wipe yourself from front to back after using the toilet.  · Keep all follow-up visits as told by your health care provider. This is important.  How is this prevented?  · Do not   douche.  · Wash the outside of your vagina with warm water only.  · Use protection when having sex. This includes latex condoms and dental dams.  · Limit how many sexual partners you have. To help prevent bacterial vaginosis, it is best to have sex with just one partner (monogamous).  · Make sure you and your sexual partner are tested for STIs.  · Wear cotton or cotton-lined underwear.  · Avoid wearing tight pants and pantyhose, especially during summer.  · Limit the amount of alcohol that you drink.  · Do not use any products that contain  nicotine or tobacco, such as cigarettes and e-cigarettes. If you need help quitting, ask your health care provider.  · Do not use illegal drugs.  Where to find more information  · Centers for Disease Control and Prevention: www.cdc.gov/std  · American Sexual Health Association (ASHA): www.ashastd.org  · U.S. Department of Health and Human Services, Office on Women's Health: www.womenshealth.gov/ or https://www.womenshealth.gov/a-z-topics/bacterial-vaginosis  Contact a health care provider if:  · Your symptoms do not improve, even after treatment.  · You have more discharge or pain when urinating.  · You have a fever.  · You have pain in your abdomen.  · You have pain during sex.  · You have vaginal bleeding between periods.  Summary  · Bacterial vaginosis is a vaginal infection that occurs when the normal balance of bacteria in the vagina is disrupted.  · Because bacterial vaginosis increases your risk for STIs (sexually transmitted infections), getting treated can help reduce your risk for chlamydia, gonorrhea, herpes, and HIV (human immunodeficiency virus). Treatment is also important for preventing complications in pregnant women, because the condition can cause an early (premature) delivery.  · This condition is treated with antibiotic medicines. These may be given as a pill, a vaginal cream, or a medicine that is put into the vagina (suppository).  This information is not intended to replace advice given to you by your health care provider. Make sure you discuss any questions you have with your health care provider.  Document Released: 10/26/2005 Document Revised: 03/01/2017 Document Reviewed: 07/11/2016  Elsevier Interactive Patient Education © 2019 Elsevier Inc.

## 2019-05-01 LAB — GC/CHLAMYDIA PROBE AMP (~~LOC~~) NOT AT ARMC
Chlamydia: NEGATIVE
Neisseria Gonorrhea: NEGATIVE

## 2019-06-01 ENCOUNTER — Encounter: Payer: Medicaid Other | Admitting: Certified Nurse Midwife

## 2019-06-01 DIAGNOSIS — Z3400 Encounter for supervision of normal first pregnancy, unspecified trimester: Secondary | ICD-10-CM | POA: Insufficient documentation

## 2019-06-01 DIAGNOSIS — Z3480 Encounter for supervision of other normal pregnancy, unspecified trimester: Secondary | ICD-10-CM | POA: Insufficient documentation

## 2019-07-31 ENCOUNTER — Encounter: Payer: Medicaid Other | Admitting: Advanced Practice Midwife

## 2019-08-02 ENCOUNTER — Ambulatory Visit (INDEPENDENT_AMBULATORY_CARE_PROVIDER_SITE_OTHER): Payer: Medicaid Other | Admitting: Obstetrics and Gynecology

## 2019-08-02 ENCOUNTER — Other Ambulatory Visit: Payer: Self-pay

## 2019-08-02 ENCOUNTER — Encounter: Payer: Self-pay | Admitting: Obstetrics and Gynecology

## 2019-08-02 VITALS — BP 104/47 | HR 85 | Temp 98.2°F | Wt 167.0 lb

## 2019-08-02 DIAGNOSIS — Z3490 Encounter for supervision of normal pregnancy, unspecified, unspecified trimester: Secondary | ICD-10-CM

## 2019-08-02 DIAGNOSIS — Z3491 Encounter for supervision of normal pregnancy, unspecified, first trimester: Secondary | ICD-10-CM | POA: Diagnosis not present

## 2019-08-02 DIAGNOSIS — Z3009 Encounter for other general counseling and advice on contraception: Secondary | ICD-10-CM

## 2019-08-02 DIAGNOSIS — Z3A11 11 weeks gestation of pregnancy: Secondary | ICD-10-CM

## 2019-08-02 MED ORDER — PROMETHAZINE HCL 25 MG PO TABS: 25.0000 mg | ORAL_TABLET | Freq: Four times a day (QID) | ORAL | 2 refills | Status: DC | PRN

## 2019-08-02 NOTE — Progress Notes (Signed)
GYNECOLOGY OFFICE VISIT NOTE  History:  23 y.o. G3P1011 here today for NOB but now reporting she thinks she miscarried and this is a new pregnancy, which she is unsure if she will keep. She had [place] 04/19/19 that showed [redacted] weeks gestation which would make her 20 weeks. She subsequently had heavy bleeding for 2 weeks, thinks she miscarried. Then had a negative pregnancy test. Never had another period so she took a pregnancy test and it was positive. Unsure if she will continue or terminate this pregnancy. Having some nausea, otherwise, feeling well.   Past Medical History:  Diagnosis Date  . Anxiety 06/10/2015  . Bronchitis   . Bronchitis   . Depressed mood 07/01/2015  . 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 Keloid 02/11/2015  . Seasonal allergies 04/09/2015    Past Surgical History:  Procedure Laterality Date  . EXTERNAL EAR SURGERY Right   . gallbalder       Current Outpatient Medications:  .  acetaminophen (TYLENOL) 500 MG tablet, Take 500 mg by mouth as needed for headache., Disp: , Rfl:  .  metroNIDAZOLE (FLAGYL) 500 MG tablet, Take 1 tablet (500 mg total) by mouth 2 (two) times daily. (Patient not taking: Reported on 08/02/2019), Disp: 14 tablet, Rfl: 0 .  promethazine (PHENERGAN) 25 MG tablet, Take 1 tablet (25 mg total) by mouth every 6 (six) hours as needed for nausea or vomiting., Disp: 30 tablet, Rfl: 2  The following portions of the patient's history were reviewed and updated as appropriate: allergies, current medications, past family history, past medical history, past social history, past surgical history and problem list.   Review of Systems:  Pertinent items noted in HPI and remainder of comprehensive ROS otherwise negative.   Objective:  Physical Exam BP (!) 104/47   Pulse 85   Temp 98.2 F (36.8 C)   Wt 167 lb (75.8 kg)   LMP 03/08/2019 (Exact Date)   BMI 25.39 kg/m  CONSTITUTIONAL: Well-developed, well-nourished female in no acute distress.  HENT:  Normocephalic,  atraumatic. External right and left ear normal. Oropharynx is clear and moist EYES: Conjunctivae and EOM are normal. Pupils are equal, round, and reactive to light. No scleral icterus.  NECK: Normal range of motion, supple, no masses SKIN: Skin is warm and dry. No rash noted. Not diaphoretic. No erythema. No pallor. NEUROLOGIC: Alert and oriented to person, place, and time. Normal reflexes, muscle tone coordination. No cranial nerve deficit noted. PSYCHIATRIC: Normal mood and affect. Normal behavior. Normal judgment and thought content. CARDIOVASCULAR: Normal heart rate noted RESPIRATORY: Effort normal, no problems with respiration noted ABDOMEN: Soft, no distention noted.   PELVIC: deferred MUSCULOSKELETAL: Normal range of motion. No edema noted.  Bedside US: singleton IUP with cardiac activity, normal fetal activity measuring [redacted]w[redacted]d by CRL, ovaries/tubes not vizualized  Pt informed that the ultrasound is considered a limited OB ultrasound and is not intended to be a complete ultrasound exam.  Patient also informed that the ultrasound is not being completed with the intent of assessing for fetal or placental anomalies or any pelvic abnormalities.  Explained that the purpose of today's ultrasound is to assess for  dating.  Patient acknowledges the purpose of the exam and the limitations of the study.    Labs and Imaging No results found.  Assessment & Plan:   1. Pregnancy, unspecified gestational age - Reviewed US findings with patient, she is approx [redacted]w[redacted]d by [place] today - she is unsure if she will continue pregnancy - patient will  make decision and contact office for NOB if she desires to continue pregnancy - phenergan sent for nausea  2. Encounter for counseling regarding contraception - Reviewed options for contraception today briefly with patient - Patient to call to make return appt for contraception counseling/placement if she terminates pregnancy   Routine preventative health  maintenance measures emphasized. Please refer to After Visit Summary for other counseling recommendations.   No follow-ups on file.   Total face-to-face time with patient: 20 minutes. Over 50% of encounter was spent on counseling and coordination of care.   Baldemar Lenis, M.D. [facility])

## 2019-08-02 NOTE — Progress Notes (Signed)
NEW OB.  Declined FLU.

## 2019-08-14 ENCOUNTER — Other Ambulatory Visit: Payer: Self-pay | Admitting: *Deleted

## 2019-08-14 DIAGNOSIS — N760 Acute vaginitis: Secondary | ICD-10-CM

## 2019-08-14 DIAGNOSIS — B9689 Other specified bacterial agents as the cause of diseases classified elsewhere: Secondary | ICD-10-CM

## 2019-08-14 MED ORDER — METRONIDAZOLE 0.75 % VA GEL: 1.0000 | Freq: Two times a day (BID) | VAGINAL | 0 refills | Status: DC

## 2019-08-14 NOTE — Progress Notes (Signed)
Pt called to office stating she has symptoms of BV and would like treatment.  Pt had tried pills in past and would like something different as she feels pills didn't work well. Metrogel sent in today per protocol. Pt advised if symptoms persist after tx, contact office for visit.

## 2019-09-06 ENCOUNTER — Other Ambulatory Visit (HOSPITAL_COMMUNITY)
Admission: RE | Admit: 2019-09-06 | Discharge: 2019-09-06 | Disposition: A | Discharge status: Home / Self Care | Payer: Medicaid Other | Source: Ambulatory Visit | Attending: Obstetrics & Gynecology | Admitting: Obstetrics & Gynecology

## 2019-09-06 ENCOUNTER — Ambulatory Visit (INDEPENDENT_AMBULATORY_CARE_PROVIDER_SITE_OTHER): Payer: Medicaid Other | Admitting: Obstetrics & Gynecology

## 2019-09-06 ENCOUNTER — Encounter: Payer: Self-pay | Admitting: Obstetrics & Gynecology

## 2019-09-06 ENCOUNTER — Other Ambulatory Visit: Payer: Self-pay

## 2019-09-06 DIAGNOSIS — Z3490 Encounter for supervision of normal pregnancy, unspecified, unspecified trimester: Secondary | ICD-10-CM | POA: Insufficient documentation

## 2019-09-06 DIAGNOSIS — Z3A16 16 weeks gestation of pregnancy: Secondary | ICD-10-CM

## 2019-09-06 DIAGNOSIS — Z3492 Encounter for supervision of normal pregnancy, unspecified, second trimester: Secondary | ICD-10-CM

## 2019-09-06 DIAGNOSIS — R8271 Bacteriuria: Secondary | ICD-10-CM

## 2019-09-06 MED ORDER — ONDANSETRON 4 MG PO TBDP: 4.0000 mg | ORAL_TABLET | Freq: Four times a day (QID) | ORAL | 0 refills | Status: DC | PRN

## 2019-09-06 MED ORDER — BLOOD PRESSURE KIT DEVI: 1.0000 | 0 refills | Status: DC

## 2019-09-06 NOTE — Progress Notes (Signed)
  Subjective:    Kaitlin Montgomery is a G3P1011 [redacted]w[redacted]d being seen today for her first obstetrical visit.  Her obstetrical history is significant for a recent early miscarriage without complication. Patient does intend to breast feed. Pregnancy history fully reviewed.  Patient reports nausea and vomiting.  Vitals:   09/06/19 1324  BP: (!) 88/57  Pulse: (!) 111  Weight: 157 lb 12.8 oz (71.6 kg)    HISTORY: OB History  Gravida Para Term Preterm AB Living  3 1 1   1 1   SAB TAB Ectopic Multiple Live Births  1     0 1    # Outcome Date GA Lbr Len/2nd Weight Sex Delivery Anes PTL Lv  3 Current           2 SAB 06/06/19 [redacted]w[redacted]d         1 Term 04/21/17 [redacted]w[redacted]d 05:41 / 00:48 7 lb 5.1 oz (3.32 kg) F Vag-Spont EPI  LIV   Past Medical History:  Diagnosis Date  . Anxiety 06/10/2015  . Bronchitis   . Bronchitis   . Depressed mood 07/01/2015  . 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 Keloid 02/11/2015  . Seasonal allergies 04/09/2015   Past Surgical History:  Procedure Laterality Date  . EXTERNAL EAR SURGERY Right   . gallbalder     Family History  Problem Relation Age of Onset  . Cancer Maternal Grandmother      Exam    Uterus:     Pelvic Exam:    Perineum: No Hemorrhoids   Vulva: normal   Vagina:  thin grey discharge   pH:     Cervix: no lesions   Adnexa: not evaluated   Bony Pelvis: average  System: Breast:  normal appearance, no masses or tenderness   Skin: normal coloration and turgor, no rashes    Neurologic: oriented, normal mood   Extremities: normal strength, tone, and muscle mass   HEENT PERRLA and sclera clear, anicteric   Mouth/Teeth mucous membranes moist, pharynx normal without lesions, dental hygiene good and has orthodontics   Neck supple and no masses   Cardiovascular: regular rate and rhythm, no murmurs or gallops   Respiratory:  appears well, vitals normal, no respiratory distress, acyanotic, normal RR, neck free of mass or lymphadenopathy, chest clear, no  wheezing, crepitations, rhonchi, normal symmetric air entry   Abdomen: soft, non-tender; bowel sounds normal; no masses,  no organomegaly   Urinary: urethral meatus normal      Assessment:    Pregnancy: [id number] Patient Active Problem List   Diagnosis Date Noted  . Supervision of low-risk pregnancy 09/06/2019  . Gall bladder polyp 12/30/2016        Plan:     Initial labs drawn. Prenatal vitamins. Problem list reviewed and updated. Genetic Screening discussed Panorama and AFP: ordered  Ultrasound discussed; fetal survey: ordered.  Follow up in 4 weeks. 50% of 30 min visit spent on counseling and coordination of care.  Zofran for nausea and vomiting   Emeterio Reeve 09/06/2019

## 2019-09-06 NOTE — Patient Instructions (Signed)
Preventing Influenza, Adult Influenza, more commonly known as "the flu," is a viral infection that mainly affects the respiratory tract. The respiratory tract includes structures that help you breathe, such as the lungs, nose, and throat. The flu causes many common cold symptoms, as well as a high fever and body aches. The flu spreads easily from person to person (is contagious). The flu is most common from December through March. This is called flu season.You can catch the flu virus by:  Breathing in droplets from an infected person's cough or sneeze.  Touching something that was recently contaminated with the virus and then touching your mouth, nose, or eyes. What can I do to lower my risk?        You can decrease your risk of getting the flu by:  Getting a flu shot (influenza vaccination) every year. This is the best way to prevent the flu. A flu shot is recommended for everyone age 6 months and older. ? It is best to get a flu shot in the fall, as soon as it is available. Getting a flu shot during winter or spring instead is still a good idea. Flu season can last into early spring. ? Preventing the flu through vaccination requires getting a new flu shot every year. This is because the flu virus changes slightly (mutates) from one year to the next. Even if a flu shot does not completely protect you from all flu virus mutations, it can reduce the severity of your illness and prevent dangerous complications of the flu. ? If you are pregnant, you can and should get a flu shot. ? If you have had a reaction to the shot in the past or if you are allergic to eggs, check with your health care provider before getting a flu shot. ? Sometimes the vaccine is available as a nasal spray. In some years, the nasal spray has not been as effective against the flu virus. Check with your health care provider if you have questions about this.  Practicing good health habits. This is especially important during  flu season. ? Avoid contact with people who are sick with flu or cold symptoms. ? Wash your hands with soap and water often. If soap and water are not available, use alcohol-based hand sanitizer. ? Avoid touching your hands to your face, especially when you have not washed your hands recently. ? Use a disinfectant to clean surfaces at home and at work that may be contaminated with the flu virus. ? Keep your body's disease-fighting system (immune system) in good shape by eating a healthy diet, drinking plenty of fluids, getting enough sleep, and exercising regularly. If you do get the flu, avoid spreading it to others by:  Staying home until your symptoms have been gone for at least one day.  Covering your mouth and nose when you cough or sneeze.  Avoiding close contact with others, especially babies and elderly people. Why are these changes important? Getting a flu shot and practicing good health habits protects you as well as other people. If you get the flu, your friends, family, and co-workers are also at risk of getting it, because it spreads so easily to others. Each year, about 2 out of every 10 people get the flu. Having the flu can lead to complications, such as pneumonia, ear infection, and sinus infection. The flu also can be deadly, especially for babies, people older than age 65, and people who have serious long-term diseases. How is this treated? Most   people recover from the flu by resting at home and drinking plenty of fluids. However, a prescription antiviral medicine may reduce your flu symptoms and may make your flu go away sooner. This medicine must be started within a few days of getting flu symptoms. You can talk with your health care provider about whether you need an antiviral medicine. Antiviral medicine may be prescribed for people who are at risk for more serious flu symptoms. This includes people who:  Are older than age 7.  Are pregnant.  Have a condition that  makes the flu worse or more dangerous. Where to find more information  Centers for Disease Control and Prevention: tsavxtf.com  ItsBlog.fr: InternetEnthusiasts.hu  American Academy of Family Physicians: familydoctor.org/familydoctor/en/kids/vaccines/preventing-the-flu.html Contact a health care provider if:  You have influenza and you develop new symptoms.  You have: ? Chest pain. ? Diarrhea. ? A fever.  Your cough gets worse, or you produce more mucus. Summary  The best way to prevent the flu is to get a flu shot every year in the fall.  Even if you get the flu after you have received the yearly vaccine, your flu may be milder and go away sooner because of your flu shot.  If you get the flu, antiviral medicines that are started with a few days of symptoms may reduce your flu symptoms and may make your flu go away sooner.  You can also help prevent the flu by practicing good health habits. This information is not intended to replace advice given to you by your health care provider. Make sure you discuss any questions you have with your health care provider. Document Released: 11/10/2015 Document Revised: 10/08/2017 Document Reviewed: 07/04/2016 Elsevier Patient Education  2020 [employer]. Second Trimester of Pregnancy  The second trimester is from week 14 through week 27 (month 4 through 6). This is often the time in pregnancy that you feel your best. Often times, morning sickness has lessened or quit. You may have more energy, and you may get hungry more often. Your unborn baby is growing rapidly. At the end of the sixth month, he or she is about 9 inches long and weighs about 1 pounds. You will likely feel the baby move between 18 and 20 weeks of pregnancy. Follow these instructions at home: Medicines  Take over-the-counter and prescription medicines only as told by your doctor. Some medicines are safe and some medicines are not safe during pregnancy.   Take a prenatal vitamin that contains at least 600 micrograms (mcg) of folic acid.  If you have trouble pooping (constipation), take medicine that will make your stool soft (stool softener) if your doctor approves. Eating and drinking   Eat regular, healthy meals.  Avoid raw meat and uncooked cheese.  If you get low calcium from the food you eat, talk to your doctor about taking a daily calcium supplement.  Avoid foods that are high in fat and sugars, such as fried and sweet foods.  If you feel sick to your stomach (nauseous) or throw up (vomit): ? Eat 4 or 5 small meals a day instead of 3 large meals. ? Try eating a few soda crackers. ? Drink liquids between meals instead of during meals.  To prevent constipation: ? Eat foods that are high in fiber, like fresh fruits and vegetables, whole grains, and beans. ? Drink enough fluids to keep your pee (urine) clear or pale yellow. Activity  Exercise only as told by your doctor. Stop exercising if you start to  have cramps.  Do not exercise if it is too hot, too humid, or if you are in a place of great height (high altitude).  Avoid heavy lifting.  Wear low-heeled shoes. Sit and stand up straight.  You can continue to have sex unless your doctor tells you not to. Relieving pain and discomfort  Wear a good support bra if your breasts are tender.  Take warm water baths (sitz baths) to soothe pain or discomfort caused by hemorrhoids. Use hemorrhoid cream if your doctor approves.  Rest with your legs raised if you have leg cramps or low back pain.  If you develop puffy, bulging veins (varicose veins) in your legs: ? Wear support hose or compression stockings as told by your doctor. ? Raise (elevate) your feet for 15 minutes, 3-4 times a day. ? Limit salt in your food. Prenatal care  Write down your questions. Take them to your prenatal visits.  Keep all your prenatal visits as told by your doctor. This is important. Safety   Wear your seat belt when driving.  Make a list of emergency phone numbers, including numbers for family, friends, the hospital, and police and fire departments. General instructions  Ask your doctor about the right foods to eat or for help finding a counselor, if you need these services.  Ask your doctor about local prenatal classes. Begin classes before month 6 of your pregnancy.  Do not use hot tubs, steam rooms, or saunas.  Do not douche or use tampons or scented sanitary pads.  Do not cross your legs for long periods of time.  Visit your dentist if you have not done so. Use a soft toothbrush to brush your teeth. Floss gently.  Avoid all smoking, herbs, and alcohol. Avoid drugs that are not approved by your doctor.  Do not use any products that contain nicotine or tobacco, such as cigarettes and e-cigarettes. If you need help quitting, ask your doctor.  Avoid cat litter boxes and soil used by cats. These carry germs that can cause birth defects in the baby and can cause a loss of your baby (miscarriage) or stillbirth. Contact a doctor if:  You have mild cramps or pressure in your lower belly.  You have pain when you pee (urinate).  You have bad smelling fluid coming from your vagina.  You continue to feel sick to your stomach (nauseous), throw up (vomit), or have watery poop (diarrhea).  You have a nagging pain in your belly area.  You feel dizzy. Get help right away if:  You have a fever.  You are leaking fluid from your vagina.  You have spotting or bleeding from your vagina.  You have severe belly cramping or pain.  You lose or gain weight rapidly.  You have trouble catching your breath and have chest pain.  You notice sudden or extreme puffiness (swelling) of your face, hands, ankles, feet, or legs.  You have not felt the baby move in over an hour.  You have severe headaches that do not go away when you take medicine.  You have trouble seeing. Summary   The second trimester is from week 14 through week 27 (months 4 through 6). This is often the time in pregnancy that you feel your best.  To take care of yourself and your unborn baby, you will need to eat healthy meals, take medicines only if your doctor tells you to do so, and do activities that are safe for you and your baby.  Call  your doctor if you get sick or if you notice anything unusual about your pregnancy. Also, call your doctor if you need help with the right food to eat, or if you want to know what activities are safe for you. This information is not intended to replace advice given to you by your health care provider. Make sure you discuss any questions you have with your health care provider. Document Released: 01/20/2010 Document Revised: 02/17/2019 Document Reviewed: 12/01/2016 Elsevier Patient Education  2020 [employer].

## 2019-09-06 NOTE — Progress Notes (Signed)
New OB.  C/o NV, no appetite.

## 2019-09-07 LAB — CERVICOVAGINAL ANCILLARY ONLY
Bacterial Vaginitis (gardnerella): POSITIVE — AB
Candida Glabrata: NEGATIVE
Candida Vaginitis: POSITIVE — AB
Chlamydia: NEGATIVE
Comment: NEGATIVE
Comment: NEGATIVE
Comment: NEGATIVE
Comment: NEGATIVE
Comment: NEGATIVE
Comment: NORMAL
Neisseria Gonorrhea: NEGATIVE
Trichomonas: NEGATIVE

## 2019-09-09 LAB — AFP, SERUM, OPEN SPINA BIFIDA
AFP MoM: 1.32
AFP Value: 47.5 ng/mL
Gest. Age on Collection Date: 16.1 weeks
Maternal Age At EDD: 22.3 yr
OSBR Risk 1 IN: 9058
Test Results:: NEGATIVE
Weight: 157 [lb_av]

## 2019-09-09 LAB — OBSTETRIC PANEL, INCLUDING HIV
Antibody Screen: NEGATIVE
Basophils Absolute: 0 10*3/uL (ref 0.0–0.2)
Basos: 0 %
EOS (ABSOLUTE): 0.1 10*3/uL (ref 0.0–0.4)
Eos: 1 %
HIV Screen 4th Generation wRfx: NONREACTIVE
Hematocrit: 32.3 % — ABNORMAL LOW (ref 34.0–46.6)
Hemoglobin: 10.9 g/dL — ABNORMAL LOW (ref 11.1–15.9)
Hepatitis B Surface Ag: NEGATIVE
Immature Grans (Abs): 0 10*3/uL (ref 0.0–0.1)
Immature Granulocytes: 0 %
Lymphocytes Absolute: 1.8 10*3/uL (ref 0.7–3.1)
Lymphs: 23 %
MCH: 29.4 pg (ref 26.6–33.0)
MCHC: 33.7 g/dL (ref 31.5–35.7)
MCV: 87 fL (ref 79–97)
Monocytes Absolute: 0.4 10*3/uL (ref 0.1–0.9)
Monocytes: 5 %
Neutrophils Absolute: 5.4 10*3/uL (ref 1.4–7.0)
Neutrophils: 71 %
Platelets: 209 10*3/uL (ref 150–450)
RBC: 3.71 x10E6/uL — ABNORMAL LOW (ref 3.77–5.28)
RDW: 12.9 % (ref 11.7–15.4)
RPR Ser Ql: NONREACTIVE
Rh Factor: POSITIVE
Rubella Antibodies, IGG: 3.79 index (ref >0.99)
WBC: 7.7 10*3/uL (ref 3.4–10.8)

## 2019-09-12 LAB — CULTURE, OB URINE

## 2019-09-12 LAB — URINE CULTURE, OB REFLEX

## 2019-09-13 ENCOUNTER — Telehealth: Payer: Self-pay

## 2019-09-13 LAB — CYTOLOGY - PAP
Comment: NEGATIVE
Diagnosis: NEGATIVE
High risk HPV: NEGATIVE

## 2019-09-13 NOTE — Telephone Encounter (Signed)
-----   Message from Elvera Maria, CNM sent at 09/13/2019  4:27 PM EST ----- Regarding: RE: Abnormal results It looks like Dr Roselie Awkward saw her and he doesn't mention any symptoms. Can you call her and ask if she has any vaginal discharge with odor or itching/burning? If she has no symptoms, I don't recommend treatment. If she has symptoms, I will verbal order you to send Terazol 7 and Flagyl Rx. All her other labs, including Pap are normal.  Thanks. ----- Message ----- From: Tamela Oddi, RMA Sent: 09/13/2019   4:14 PM EST To: Elvera Maria, CNM Subject: Abnormal results                               Hi Lisa, Patient is requesting her results, not sure if you have seen them yet. Please advise. Do you want me to send Rx for BV & Yeast?

## 2019-09-18 ENCOUNTER — Encounter: Payer: Self-pay | Admitting: Obstetrics & Gynecology

## 2019-09-18 ENCOUNTER — Other Ambulatory Visit: Payer: Self-pay | Admitting: Obstetrics & Gynecology

## 2019-09-18 DIAGNOSIS — Z3490 Encounter for supervision of normal pregnancy, unspecified, unspecified trimester: Secondary | ICD-10-CM

## 2019-09-18 DIAGNOSIS — B9689 Other specified bacterial agents as the cause of diseases classified elsewhere: Secondary | ICD-10-CM

## 2019-09-18 DIAGNOSIS — N760 Acute vaginitis: Secondary | ICD-10-CM

## 2019-09-18 DIAGNOSIS — R8271 Bacteriuria: Secondary | ICD-10-CM | POA: Insufficient documentation

## 2019-09-18 MED ORDER — METRONIDAZOLE 500 MG PO TABS: 500.0000 mg | ORAL_TABLET | Freq: Two times a day (BID) | ORAL | 0 refills | Status: AC

## 2019-09-18 MED ORDER — FLUCONAZOLE 150 MG PO TABS: 150.0000 mg | ORAL_TABLET | Freq: Once | ORAL | 0 refills | Status: AC

## 2019-09-19 ENCOUNTER — Encounter: Payer: Self-pay | Admitting: Family Medicine

## 2019-09-19 DIAGNOSIS — O0930 Supervision of pregnancy with insufficient antenatal care, unspecified trimester: Secondary | ICD-10-CM | POA: Insufficient documentation

## 2019-09-20 ENCOUNTER — Encounter: Payer: Self-pay | Admitting: Obstetrics & Gynecology

## 2019-09-26 ENCOUNTER — Ambulatory Visit (HOSPITAL_COMMUNITY): Payer: Medicaid Other | Attending: Obstetrics & Gynecology

## 2019-10-04 ENCOUNTER — Telehealth: Payer: Medicaid Other | Admitting: Obstetrics and Gynecology

## 2019-10-12 ENCOUNTER — Encounter: Payer: Self-pay | Admitting: Obstetrics

## 2019-10-12 ENCOUNTER — Ambulatory Visit (INDEPENDENT_AMBULATORY_CARE_PROVIDER_SITE_OTHER): Payer: Medicaid Other | Admitting: Obstetrics

## 2019-10-12 DIAGNOSIS — Z3A21 21 weeks gestation of pregnancy: Secondary | ICD-10-CM | POA: Diagnosis not present

## 2019-10-12 DIAGNOSIS — Z3492 Encounter for supervision of normal pregnancy, unspecified, second trimester: Secondary | ICD-10-CM

## 2019-10-12 DIAGNOSIS — Z3490 Encounter for supervision of normal pregnancy, unspecified, unspecified trimester: Secondary | ICD-10-CM

## 2019-10-12 MED ORDER — PRENATE MINI 29-0.6-0.4-350 MG PO CAPS: 1.0000 | ORAL_CAPSULE | Freq: Every day | ORAL | 3 refills | Status: DC

## 2019-10-12 NOTE — Progress Notes (Signed)
   TELEHEALTH VIRTUAL OBSTETRICS VISIT ENCOUNTER NOTE  I connected with Kaitlin Montgomery on 10/12/19 at  3:00 PM EST by telephone at home and verified that I am speaking with the correct person using two identifiers.   I discussed the limitations, risks, security and privacy concerns of performing an evaluation and management service by telephone and the availability of in person appointments. I also discussed with the patient that there may be a patient responsible charge related to this service. The patient expressed understanding and agreed to proceed.  Subjective:  Kaitlin Montgomery is a 23 y.o. G3P1011 at [redacted]w[redacted]d being followed for ongoing prenatal care.  She is currently monitored for the following issues for this low-risk pregnancy and has Gall bladder polyp; Supervision of low-risk pregnancy; Group B streptococcal bacteriuria; and Late prenatal care affecting pregnancy on their problem list.  Patient reports no complaints. Reports fetal movement. Denies any contractions, bleeding or leaking of fluid.   The following portions of the patient's history were reviewed and updated as appropriate: allergies, current medications, past family history, past medical history, past social history, past surgical history and problem list.   Objective:   General:  Alert, oriented and cooperative.   Mental Status: Normal mood and affect perceived. Normal judgment and thought content.  Rest of physical exam deferred due to type of encounter  Assessment and Plan:  Pregnancy: G3P1011 at [redacted]w[redacted]d 1. Encounter for supervision of low-risk pregnancy, antepartum Rx: - Prenat w/o A-FeCbn-Meth-FA-DHA (PRENATE MINI) 29-0.6-0.4-350 MG CAPS; Take 1 capsule by mouth daily before breakfast.  Dispense: 90 capsule; Refill: 3  Preterm labor symptoms and general obstetric precautions including but not limited to vaginal bleeding, contractions, leaking of fluid and fetal movement were reviewed in detail with the patient.  I  discussed the assessment and treatment plan with the patient. The patient was provided an opportunity to ask questions and all were answered. The patient agreed with the plan and demonstrated an understanding of the instructions. The patient was advised to call back or seek an in-person office evaluation/go to MAU at [facility] for any urgent or concerning symptoms. Please refer to After Visit Summary for other counseling recommendations.   I provided 10 minutes of non-face-to-face time during this encounter.  Return in about 4 weeks (around 11/09/2019) for MyChart.  Future Appointments  Date Time Provider [place]  10/27/2019  2:00 PM WH-MFC [place] 3 WH-MFCUS MFC-US  11/08/2019 10:30 AM Constant, Vickii Chafe, MD [place] None    Baltazar Najjar, [place] for [facility] 10/12/2019

## 2019-10-12 NOTE — Progress Notes (Signed)
Patient was driving When I called  Called patient back and transferred called directly to provider @ 4:13pm

## 2019-10-27 ENCOUNTER — Ambulatory Visit (HOSPITAL_COMMUNITY): Payer: Medicaid Other

## 2019-11-08 ENCOUNTER — Telehealth: Payer: Medicaid Other | Admitting: Obstetrics and Gynecology

## 2019-11-10 NOTE — L&D Delivery Note (Signed)
Date of delivery: 02/27/2020 Estimated Date of Delivery: 02/20/20 Patient's last menstrual period was 05/16/2019. EGA: [redacted]w[redacted]d  Delivery Note At 1:19 AM a viable female was delivered via Vaginal, Spontaneous  (Presentation: Middle Occiput Posterior).  APGAR: 8, 9; weight: 4800 g, 10 pounds 9 ounces.   Placenta status: Spontaneous, Intact.   Cord: 3 vessels with the following complications: None.  Cord pH: NA  Called to see patient.  Mom pushed for less than 30 minutes to deliver a viable female infant.  The head followed by shoulders, which delivered without difficulty, and the rest of the body.  Nuchal cord noted and reduced on the perineum.  Baby to mom's chest.  Cord clamped and cut after 3 min delay.  Cord blood obtained.  Placenta delivered spontaneously, intact, with a 3-vessel cord.   All counts correct.  Hemostasis obtained with IV pitocin and fundal massage.    Anesthesia: Epidural Episiotomy: None Lacerations: None Suture Repair: NA Est. Blood Loss (mL):  450  Mom to postpartum.  Baby to Couplet care / Skin to Skin.  Kaitlin Montgomery, CNM 02/27/2020, 1:56 AM

## 2019-11-14 ENCOUNTER — Telehealth (INDEPENDENT_AMBULATORY_CARE_PROVIDER_SITE_OTHER): Payer: Medicaid Other | Admitting: Obstetrics & Gynecology

## 2019-11-14 ENCOUNTER — Encounter: Payer: Self-pay | Admitting: Obstetrics & Gynecology

## 2019-11-14 DIAGNOSIS — Z3492 Encounter for supervision of normal pregnancy, unspecified, second trimester: Secondary | ICD-10-CM | POA: Diagnosis not present

## 2019-11-14 DIAGNOSIS — Z3490 Encounter for supervision of normal pregnancy, unspecified, unspecified trimester: Secondary | ICD-10-CM

## 2019-11-14 DIAGNOSIS — Z3A26 26 weeks gestation of pregnancy: Secondary | ICD-10-CM | POA: Diagnosis not present

## 2019-11-14 MED ORDER — PRENATE MINI 29-0.6-0.4-350 MG PO CAPS: 1.0000 | ORAL_CAPSULE | Freq: Every day | ORAL | 3 refills | Status: DC

## 2019-11-14 MED ORDER — BLOOD PRESSURE KIT DEVI: 1.0000 | 0 refills | Status: DC

## 2019-11-14 NOTE — Patient Instructions (Signed)

## 2019-11-14 NOTE — Progress Notes (Signed)
I connected with  Kaitlin Montgomery on 11/14/19 by a video enabled telemedicine application and verified that I am speaking with the correct person using two identifiers.   I discussed the limitations of evaluation and management by telemedicine. The patient expressed understanding and agreed to proceed.    ROB. BP reordered.  C/o stomach hurts/cramps when she walks 5/10 x 1 week.

## 2019-11-14 NOTE — Progress Notes (Signed)
   TELEHEALTH VIRTUAL OBSTETRICS VISIT ENCOUNTER NOTE  I connected with Kaitlin Montgomery on 11/14/19 at  9:15 AM EST by telephone at home and verified that I am speaking with the correct person using two identifiers.   I discussed the limitations, risks, security and privacy concerns of performing an evaluation and management service by telephone and the availability of in person appointments. I also discussed with the patient that there may be a patient responsible charge related to this service. The patient expressed understanding and agreed to proceed.  Subjective:  Gladies Montgomery is a 24 y.o. G3P1011 at [redacted]w[redacted]d being followed for ongoing prenatal care.  She is currently monitored for the following issues for this low-risk pregnancy and has Gall bladder polyp; Supervision of low-risk pregnancy; Group B streptococcal bacteriuria; and Late prenatal care affecting pregnancy on their problem list.  Patient reports pressure when walking. Reports fetal movement. Denies any contractions, bleeding or leaking of fluid.   The following portions of the patient's history were reviewed and updated as appropriate: allergies, current medications, past family history, past medical history, past social history, past surgical history and problem list.   Objective:   General:  Alert, oriented and cooperative.   Mental Status: Normal mood and affect perceived. Normal judgment and thought content.  Rest of physical exam deferred due to type of encounter  Assessment and Plan:  Pregnancy: G3P1011 at [redacted]w[redacted]d 1. Encounter for supervision of low-risk pregnancy, antepartum [place] appt was rescheduled - Prenat w/o A-FeCbn-Meth-FA-DHA (PRENATE MINI) 29-0.6-0.4-350 MG CAPS; Take 1 capsule by mouth daily before breakfast.  Dispense: 90 capsule; Refill: 3  Preterm labor symptoms and general obstetric precautions including but not limited to vaginal bleeding, contractions, leaking of fluid and fetal movement were reviewed in  detail with the patient.  I discussed the assessment and treatment plan with the patient. The patient was provided an opportunity to ask questions and all were answered. The patient agreed with the plan and demonstrated an understanding of the instructions. The patient was advised to call back or seek an in-person office evaluation/go to [facility]U at [facility] for any urgent or concerning symptoms. Please refer to After Visit Summary for other counseling recommendations.   I provided 10 minutes of non-face-to-face time on the phone during this encounter.  Return in about 2 weeks (around 11/28/2019) for 2 hr GTT. Needs note for work that she needs to keep her [place] appt as scheduled.  Future Appointments  Date Time Provider Department Center  11/21/2019  1:30 PM WH-MFC [place] 5 WH-MFCUS MFC-US    Scheryl Darter, MD Center for [facility], [facility]

## 2019-11-21 ENCOUNTER — Other Ambulatory Visit: Payer: Self-pay | Admitting: Obstetrics & Gynecology

## 2019-11-21 ENCOUNTER — Other Ambulatory Visit (HOSPITAL_COMMUNITY): Payer: Self-pay | Admitting: *Deleted

## 2019-11-21 ENCOUNTER — Other Ambulatory Visit: Payer: Self-pay

## 2019-11-21 ENCOUNTER — Ambulatory Visit (HOSPITAL_COMMUNITY)
Admission: RE | Admit: 2019-11-21 | Discharge: 2019-11-21 | Disposition: A | Discharge status: Home / Self Care | Payer: Medicaid Other | Source: Ambulatory Visit | Attending: Obstetrics & Gynecology | Admitting: Obstetrics & Gynecology

## 2019-11-21 DIAGNOSIS — Z3490 Encounter for supervision of normal pregnancy, unspecified, unspecified trimester: Secondary | ICD-10-CM | POA: Diagnosis not present

## 2019-11-21 DIAGNOSIS — Z3A27 27 weeks gestation of pregnancy: Secondary | ICD-10-CM | POA: Diagnosis not present

## 2019-11-21 DIAGNOSIS — O0932 Supervision of pregnancy with insufficient antenatal care, second trimester: Secondary | ICD-10-CM | POA: Diagnosis not present

## 2019-11-21 DIAGNOSIS — Z3689 Encounter for other specified antenatal screening: Secondary | ICD-10-CM

## 2019-11-27 ENCOUNTER — Other Ambulatory Visit: Payer: Medicaid Other

## 2019-11-27 ENCOUNTER — Other Ambulatory Visit: Payer: Self-pay

## 2019-11-27 ENCOUNTER — Ambulatory Visit (INDEPENDENT_AMBULATORY_CARE_PROVIDER_SITE_OTHER): Payer: Medicaid Other | Admitting: Advanced Practice Midwife

## 2019-11-27 VITALS — BP 122/74 | HR 84 | Wt 172.0 lb

## 2019-11-27 DIAGNOSIS — N760 Acute vaginitis: Secondary | ICD-10-CM

## 2019-11-27 DIAGNOSIS — Z3492 Encounter for supervision of normal pregnancy, unspecified, second trimester: Secondary | ICD-10-CM

## 2019-11-27 DIAGNOSIS — Z3A27 27 weeks gestation of pregnancy: Secondary | ICD-10-CM

## 2019-11-27 DIAGNOSIS — Z20828 Contact with and (suspected) exposure to other viral communicable diseases: Secondary | ICD-10-CM | POA: Insufficient documentation

## 2019-11-27 DIAGNOSIS — B9689 Other specified bacterial agents as the cause of diseases classified elsewhere: Secondary | ICD-10-CM

## 2019-11-27 DIAGNOSIS — Z3490 Encounter for supervision of normal pregnancy, unspecified, unspecified trimester: Secondary | ICD-10-CM

## 2019-11-27 MED ORDER — BLOOD PRESSURE MONITOR DEVI: 0 refills | Status: DC

## 2019-11-27 NOTE — Patient Instructions (Signed)

## 2019-11-27 NOTE — Progress Notes (Signed)
   PRENATAL VISIT NOTE  Subjective:  Kaitlin Montgomery is a 24 y.o. G3P1011 at [redacted]w[redacted]d being seen today for ongoing prenatal care.  She is currently monitored for the following issues for this low-risk pregnancy and has Gall bladder polyp; Supervision of low-risk pregnancy; Group B streptococcal bacteriuria; Late prenatal care affecting pregnancy; and Exposure to herpes simplex virus (HSV) on their problem list.  Patient reports no complaints.   .  .  Movement: Present. Denies leaking of fluid.  The following portions of the patient's history were reviewed and updated as appropriate: allergies, current medications, past family history, past medical history, past social history, past surgical history and problem list.   Objective:   Vitals:   11/27/19 1104  BP: 122/74  Pulse: 84  Weight: 172 lb (78 kg)    Fetal Status:     Movement: Present     General:  Alert, oriented and cooperative. Patient is in no acute distress.  Skin: Skin is warm and dry. No rash noted.   Cardiovascular: Normal heart rate noted  Respiratory: Normal respiratory effort, no problems with respiration noted  Abdomen: Soft, gravid, appropriate for gestational age, 28 cm fundal height.  Pain/Pressure: Absent     Pelvic: Cervical exam deferred        Extremities: Normal range of motion.  Edema: None  Mental Status: Normal mood and affect. Normal behavior. Normal judgment and thought content.   Assessment and Plan:  Pregnancy: G3P1011 at [redacted]w[redacted]d 1. Encounter for supervision of low-risk pregnancy, antepartum Patient is due for 2 hour GTT. However, she ate after midnight, so we will schedule follow-up GTT in the next week. Patient's genetic screen did not result properly, plan to repeat. - 12/04/19 2-hour GTT - 12/04/19 Repeat Genetic Screen  2. BV (bacterial vaginosis) Patient requests swab for BV as she did not finish her course of Metronidazole for prior diagnosis. Patient is asymptomatic at this time. Recommended  deferral of test unless symptoms develop and discussed preventative behaviors and probiotic use.  3. Vaccine Refusal Patient declined Influenza and Tdap vaccines. Did not cite specific reason other than she did not take them in her prior pregnancy and does not think them necessary. Counseled on risks of neonatal Pertussis and Flu infections. Patient states that she will consider the vaccine further.  4. 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 She is no longer active with infected partner. Patient has not had any outbreaks. Counseled patient that no testing or preventative action is necessary in the absence of symptoms, though patient requests preventative medication. Valtrex to start at 34 weeks to prevent outbreak per patient's request.  No labor symptoms and general obstetric precautions including but not limited to vaginal bleeding, contractions, leaking of fluid and fetal movement were reviewed in detail with the patient. Please refer to After Visit Summary for other counseling recommendations.   No follow-ups on file.  Future Appointments  Date Time Provider Department Center  12/04/2019  8:15 AM [facility] None  12/25/2019  2:30 PM [facility] NURSE WH-MFC MFC-US  12/25/2019  2:30 PM WH-MFC [place] 1 WH-MFCUS MFC-US  12/25/2019  4:15 PM Brock Bad, MD [facility] None    Marjie Skiff, Medical Student

## 2019-11-27 NOTE — Progress Notes (Signed)
Decline flu and tdap

## 2019-12-04 ENCOUNTER — Other Ambulatory Visit: Payer: Self-pay

## 2019-12-04 ENCOUNTER — Other Ambulatory Visit: Payer: Medicaid Other

## 2019-12-04 DIAGNOSIS — Z3490 Encounter for supervision of normal pregnancy, unspecified, unspecified trimester: Secondary | ICD-10-CM

## 2019-12-05 ENCOUNTER — Other Ambulatory Visit: Payer: Self-pay | Admitting: Obstetrics and Gynecology

## 2019-12-05 DIAGNOSIS — O99019 Anemia complicating pregnancy, unspecified trimester: Secondary | ICD-10-CM | POA: Insufficient documentation

## 2019-12-05 DIAGNOSIS — O99013 Anemia complicating pregnancy, third trimester: Secondary | ICD-10-CM

## 2019-12-05 LAB — CBC
Hematocrit: 25.3 % — ABNORMAL LOW (ref 34.0–46.6)
Hemoglobin: 8.6 g/dL — ABNORMAL LOW (ref 11.1–15.9)
MCH: 30.4 pg (ref 26.6–33.0)
MCHC: 34 g/dL (ref 31.5–35.7)
MCV: 89 fL (ref 79–97)
Platelets: 146 10*3/uL — ABNORMAL LOW (ref 150–450)
RBC: 2.83 x10E6/uL — ABNORMAL LOW (ref 3.77–5.28)
RDW: 12.2 % (ref 11.7–15.4)
WBC: 7.8 10*3/uL (ref 3.4–10.8)

## 2019-12-05 LAB — GLUCOSE TOLERANCE, 2 HOURS W/ 1HR
Glucose, 1 hour: 98 mg/dL (ref 65–179)
Glucose, 2 hour: 99 mg/dL (ref 65–152)
Glucose, Fasting: 80 mg/dL (ref 65–91)

## 2019-12-05 LAB — HIV ANTIBODY (ROUTINE TESTING W REFLEX): HIV Screen 4th Generation wRfx: NONREACTIVE

## 2019-12-05 LAB — RPR: RPR Ser Ql: NONREACTIVE

## 2019-12-05 MED ORDER — SODIUM CHLORIDE 0.9 % IV SOLN: 510.0000 mg | INTRAVENOUS | Status: AC

## 2019-12-12 ENCOUNTER — Encounter: Payer: Self-pay | Admitting: Obstetrics and Gynecology

## 2019-12-25 ENCOUNTER — Ambulatory Visit (HOSPITAL_COMMUNITY): Payer: Medicaid Other

## 2019-12-25 ENCOUNTER — Telehealth: Payer: Medicaid Other | Admitting: Obstetrics

## 2019-12-25 ENCOUNTER — Encounter: Payer: Self-pay | Admitting: Obstetrics

## 2019-12-25 MED ORDER — HYDROCORTISONE ACETATE 25 MG RE SUPP: 25.0000 mg | Freq: Two times a day (BID) | RECTAL | 11 refills | Status: DC

## 2019-12-26 ENCOUNTER — Telehealth: Payer: Medicaid Other | Admitting: Obstetrics

## 2019-12-26 NOTE — Progress Notes (Signed)
Appointment cancelled

## 2019-12-27 ENCOUNTER — Other Ambulatory Visit: Payer: Self-pay

## 2019-12-27 ENCOUNTER — Ambulatory Visit (HOSPITAL_COMMUNITY): Payer: Medicaid Other

## 2019-12-27 ENCOUNTER — Ambulatory Visit (HOSPITAL_COMMUNITY)
Admission: RE | Admit: 2019-12-27 | Discharge: 2019-12-27 | Disposition: A | Discharge status: Home / Self Care | Payer: Medicaid Other | Source: Ambulatory Visit | Attending: Obstetrics | Admitting: Obstetrics

## 2019-12-27 ENCOUNTER — Telehealth (INDEPENDENT_AMBULATORY_CARE_PROVIDER_SITE_OTHER): Payer: Medicaid Other | Admitting: Advanced Practice Midwife

## 2019-12-27 DIAGNOSIS — Z3689 Encounter for other specified antenatal screening: Secondary | ICD-10-CM | POA: Diagnosis not present

## 2019-12-27 DIAGNOSIS — O99013 Anemia complicating pregnancy, third trimester: Secondary | ICD-10-CM

## 2019-12-27 DIAGNOSIS — Z3A32 32 weeks gestation of pregnancy: Secondary | ICD-10-CM | POA: Diagnosis not present

## 2019-12-27 DIAGNOSIS — Z3490 Encounter for supervision of normal pregnancy, unspecified, unspecified trimester: Secondary | ICD-10-CM

## 2019-12-27 DIAGNOSIS — O0932 Supervision of pregnancy with insufficient antenatal care, second trimester: Secondary | ICD-10-CM | POA: Diagnosis not present

## 2019-12-27 DIAGNOSIS — Z20828 Contact with and (suspected) exposure to other viral communicable diseases: Secondary | ICD-10-CM

## 2019-12-27 DIAGNOSIS — Z362 Encounter for other antenatal screening follow-up: Secondary | ICD-10-CM | POA: Diagnosis not present

## 2019-12-27 DIAGNOSIS — O99613 Diseases of the digestive system complicating pregnancy, third trimester: Secondary | ICD-10-CM

## 2019-12-27 DIAGNOSIS — K5900 Constipation, unspecified: Secondary | ICD-10-CM

## 2019-12-27 MED ORDER — PRENATE MINI 18-0.6-0.4-350 MG PO CAPS: 1.0000 | ORAL_CAPSULE | Freq: Every day | ORAL | 11 refills | Status: DC

## 2019-12-27 MED ORDER — BLOOD PRESSURE CUFF MISC: 1.0000 | 0 refills | Status: DC

## 2019-12-27 MED ORDER — FERROUS FUMARATE 325 (106 FE) MG PO TABS: 1.0000 | ORAL_TABLET | ORAL | 2 refills | Status: DC

## 2019-12-27 MED ORDER — VALACYCLOVIR HCL 500 MG PO TABS: 500.0000 mg | ORAL_TABLET | Freq: Two times a day (BID) | ORAL | 1 refills | Status: DC

## 2019-12-27 MED ORDER — DOCUSATE SODIUM 100 MG PO CAPS: 100.0000 mg | ORAL_CAPSULE | Freq: Two times a day (BID) | ORAL | 2 refills | Status: DC | PRN

## 2019-12-27 NOTE — Progress Notes (Signed)
Pt is preparing for virtual visit with provider. [redacted]w[redacted]d.

## 2019-12-27 NOTE — Progress Notes (Signed)
TELEHEALTH OBSTETRICS PRENATAL VIRTUAL VIDEO VISIT ENCOUNTER NOTE  Provider location: Center for [employer] at [place]   I connected with Kaitlin Montgomery on 12/27/19 at  4:15 PM EST by MyChart Video Encounter at home and verified that I am speaking with the correct person using two identifiers.   I discussed the limitations, risks, security and privacy concerns of performing an evaluation and management service virtually and the availability of in person appointments. I also discussed with the patient that there may be a patient responsible charge related to this service. The patient expressed understanding and agreed to proceed. Subjective:  Kaitlin Montgomery is a 24 y.o. G3P1011 at [redacted]w[redacted]d being seen today for ongoing prenatal care.  She is currently monitored for the following issues for this low-risk pregnancy and has Gall bladder polyp; Supervision of low-risk pregnancy; Group B streptococcal bacteriuria; Late prenatal care affecting pregnancy; Exposure to herpes simplex virus (HSV); and Anemia in pregnancy on their problem list.  Patient reports no complaints.   .  .   . Denies any leaking of fluid.   The following portions of the patient's history were reviewed and updated as appropriate: allergies, current medications, past family history, past medical history, past social history, past surgical history and problem list.   Objective:  There were no vitals filed for this visit.  Fetal Status:           General:  Alert, oriented and cooperative. Patient is in no acute distress.  Respiratory: Normal respiratory effort, no problems with respiration noted  Mental Status: Normal mood and affect. Normal behavior. Normal judgment and thought content.  Rest of physical exam deferred due to type of encounter  Imaging: [place] MFM OB FOLLOW UP  Result Date: 12/27/2019 ----------------------------------------------------------------------  OBSTETRICS REPORT                       (Signed  Final 12/27/2019 12:03 pm) ---------------------------------------------------------------------- Patient Info  ID #:       [id number]                          D.O.B.:  [birth year] (23 yrs)  Name:       Kaitlin Montgomery                Visit Date: 12/27/2019 11:17 am ---------------------------------------------------------------------- Performed By  Performed By:     Tommie Raymond BS,       Ref. Address:     [address]                                                             [address]                                                             [place], [place]  [address]  Attending:        Ma Rings MD         [facility]  Referred By:      Adam Phenix                    MD ---------------------------------------------------------------------- Orders   #  Description                          Code         Ordered By   1  [place] MFM OB FOLLOW UP                  [phone]     YU FANG  ----------------------------------------------------------------------   #  Order #                    Accession #                 Episode #   1  [id number]                  [id number]                  [id number]  ---------------------------------------------------------------------- Indications   Late prenatal care, second trimester           O09.32   (Negative AFP)(Negative Horizon)   [redacted] weeks gestation of pregnancy                Z3A.32   Encounter for other antenatal screening        Z36.2   follow-up  ---------------------------------------------------------------------- Fetal Evaluation  Num Of Fetuses:         1  Cardiac Activity:       Observed  Presentation:           Cephalic  Placenta:               Anterior  P. Cord Insertion:      Previously Visualized  Amniotic Fluid  AFI FV:      Within normal limits  AFI Sum(cm)     %Tile       Largest Pocket(cm)   22.26           86          9.14  RUQ(cm)       RLQ(cm)       LUQ(cm)        LLQ(cm)  9.14          3.71          3.57           5.84 ---------------------------------------------------------------------- Biometry  BPD:      82.4  mm     G. Age:  33w 1d         71  %    CI:        71.38   %    70 - 86  FL/HC:      19.8   %    19.1 - 21.3  HC:      310.6  mm     G. Age:  34w 5d         80  %    HC/AC:      1.02        0.96 - 1.17  AC:      304.9  mm     G. Age:  34w 3d         96  %    FL/BPD:     74.6   %    71 - 87  FL:       61.5  mm     G. Age:  31w 6d         31  %    FL/AC:      20.2   %    20 - 24  Est. FW:    2242  gm    4 lb 15 oz      85  % ---------------------------------------------------------------------- OB History  Gravidity:    3         Term:   1        Prem:   0        SAB:   1  TOP:          0       Ectopic:  0        Living: 1 ---------------------------------------------------------------------- Gestational Age  LMP:           32w 1d        Date:  05/16/19                 EDD:   02/20/20  U/S Today:     33w 4d                                        EDD:   02/10/20  Best:          32w 1d     Det. By:  LMP  (05/16/19)          EDD:   02/20/20 ---------------------------------------------------------------------- Anatomy  Cranium:               Previously seen        LVOT:                   Previously seen  Cavum:                 Previously seen        Aortic Arch:            Previously seen  Ventricles:            Previously seen        Ductal Arch:            Previously seen  Choroid Plexus:        Previously seen        Diaphragm:              Previously seen  Cerebellum:            Previously seen        Stomach:                Previously Seen  Posterior  Fossa:       Previously seen        Abdomen:                Previously seen  Nuchal Fold:           Not applicable (>20    Abdominal Wall:         Previously seen                          wks GA)  Face:                  Orbits and profile     Cord Vessels:           Appears normal (3                         previously seen                                vessel cord)  Lips:                  Previously seen        Kidneys:                Appear normal  Palate:                Previously seen        Bladder:                Appears normal  Thoracic:              Previously seen        Spine:                  Previously seen  Heart:                 Appears normal         Upper Extremities:      Previously seen                         (4CH, axis, and                         situs)  RVOT:                  Previously seen        Lower Extremities:      Previously seen  Other:  Technically difficult due to fetal position. ---------------------------------------------------------------------- Cervix Uterus Adnexa  Cervix  Not visualized (advanced GA >24wks)  Uterus  No abnormality visualized.  Left Ovary  Within normal limits. No adnexal mass visualized.  Right Ovary  Within normal limits. No adnexal mass visualized.  Cul De Sac  No free fluid seen.  Adnexa  No abnormality visualized. ---------------------------------------------------------------------- Comments  This patient was seen for a follow up growth scan as she  presented late for prenatal care.  She denies any problems  since her last exam.  She was informed that the fetal growth and amniotic fluid  level appears appropriate for her gestational age.  Her EDC should be kept as February 20, 2020.  Follow-up as indicated. ----------------------------------------------------------------------                   Alecia Lemming  Annamaria Boots, MD Electronically Signed Final Report   12/27/2019 12:03 pm ----------------------------------------------------------------------   Assessment and Plan:  Pregnancy: N5A2130 at [redacted]w[redacted]d 1. Encounter for supervision of low-risk pregnancy, antepartum --Pt reports good fetal movement, denies cramping, LOF, or vaginal  bleeding --Anticipatory guidance about next visits/weeks of pregnancy given. --Pt to pick up BP cuff this week.   --Will set up Fereheme injection in next 2 weeks so pt to get BP there - Prenat-FeCbn-FeAsp-Meth-FA-DHA (PRENATE MINI) 18-0.6-0.4-350 MG CAPS; Take 1 capsule by mouth daily.  Dispense: 30 capsule; Refill: 11  2. Exposure to herpes simplex virus (HSV) --Pt reported at previous visit that an ex told her he had HSV. She has no hx of outbreak but would like to take suppression just in case.  To start at 34 weeks.  - valACYclovir (VALTREX) 500 MG tablet; Take 1 tablet (500 mg total) by mouth 2 (two) times daily.  Dispense: 60 tablet; Refill: 1  3. Anemia during pregnancy in third trimester --Fereheme infusion ordered at prior visit and to be scheduled x 2, 1 week apart. - ferrous fumarate (HEMOCYTE - 106 MG FE) 325 (106 Fe) MG TABS tablet; Take 1 tablet (106 mg of iron total) by mouth every other day.  Dispense: 30 tablet; Refill: 2  4. Constipation during pregnancy in third trimester  - docusate sodium (COLACE) 100 MG capsule; Take 1 capsule (100 mg total) by mouth 2 (two) times daily as needed.  Dispense: 30 capsule; Refill: 2  Preterm labor symptoms and general obstetric precautions including but not limited to vaginal bleeding, contractions, leaking of fluid and fetal movement were reviewed in detail with the patient. I discussed the assessment and treatment plan with the patient. The patient was provided an opportunity to ask questions and all were answered. The patient agreed with the plan and demonstrated an understanding of the instructions. The patient was advised to call back or seek an in-person office evaluation/go to MAU at [facility] for any urgent or concerning symptoms. Please refer to After Visit Summary for other counseling recommendations.   I provided 10 minutes of face-to-face time during this encounter.  Return in about 4 weeks (around  01/24/2020).  No future appointments.  Fatima Blank, [place] for [employer], [place]

## 2019-12-28 ENCOUNTER — Telehealth: Payer: Medicaid Other | Admitting: Advanced Practice Midwife

## 2020-01-03 ENCOUNTER — Telehealth: Payer: Self-pay

## 2020-01-03 NOTE — Telephone Encounter (Signed)
Pt made aware that it may not have been the iron supplement that gave her a reaction, but to mention this at her appt for her iron infusion. Pt voices understanding.

## 2020-01-03 NOTE — Telephone Encounter (Signed)
Pt reports that she took the iron supplement she was prescribed for the first time yesterday and thinks she might have had an allergic reaction. Pt reports about 30 mins after she took iron supplement she became dizzy and itchy and had hives on her chest. Pt reports that she is supposed to have an iron infusion and wants to know if she should still go to that appointment. I advised pt that she can take benadryl if she needs to and to not take any more of the supplements. I advised pt I will consult with a provider about this and let her know recommendations, pt voices understanding.

## 2020-01-08 ENCOUNTER — Encounter (HOSPITAL_COMMUNITY): Payer: Medicaid Other

## 2020-01-10 ENCOUNTER — Other Ambulatory Visit: Payer: Self-pay | Admitting: Obstetrics and Gynecology

## 2020-01-10 MED ORDER — SODIUM CHLORIDE 0.9 % IV SOLN: 510.0000 mg | INTRAVENOUS | Status: AC

## 2020-01-11 ENCOUNTER — Other Ambulatory Visit: Payer: Self-pay

## 2020-01-11 ENCOUNTER — Ambulatory Visit (HOSPITAL_COMMUNITY)
Admission: RE | Admit: 2020-01-11 | Discharge: 2020-01-11 | Disposition: A | Discharge status: Home / Self Care | Payer: Medicaid Other | Source: Ambulatory Visit | Attending: Obstetrics and Gynecology | Admitting: Obstetrics and Gynecology

## 2020-01-11 DIAGNOSIS — Z3A00 Weeks of gestation of pregnancy not specified: Secondary | ICD-10-CM | POA: Insufficient documentation

## 2020-01-11 DIAGNOSIS — O99013 Anemia complicating pregnancy, third trimester: Secondary | ICD-10-CM | POA: Diagnosis present

## 2020-01-11 MED ORDER — SODIUM CHLORIDE 0.9 % IV SOLN
510.0000 mg | INTRAVENOUS | Status: DC
  Administered 2020-01-11: 510 mg via INTRAVENOUS
  Filled 2020-01-11: qty 17

## 2020-01-11 NOTE — Discharge Instructions (Signed)

## 2020-01-18 ENCOUNTER — Inpatient Hospital Stay (HOSPITAL_COMMUNITY): Admission: RE | Admit: 2020-01-18 | Payer: Medicaid Other | Source: Ambulatory Visit

## 2020-01-25 ENCOUNTER — Encounter (HOSPITAL_COMMUNITY)
Admission: RE | Admit: 2020-01-25 | Discharge: 2020-01-25 | Disposition: A | Discharge status: Home / Self Care | Payer: Medicaid Other | Source: Ambulatory Visit | Attending: Obstetrics and Gynecology | Admitting: Obstetrics and Gynecology

## 2020-01-25 DIAGNOSIS — O99013 Anemia complicating pregnancy, third trimester: Secondary | ICD-10-CM | POA: Diagnosis not present

## 2020-01-25 MED ORDER — SODIUM CHLORIDE 0.9 % IV SOLN
510.0000 mg | INTRAVENOUS | Status: DC
  Administered 2020-01-25: 510 mg via INTRAVENOUS
  Filled 2020-01-25: qty 17

## 2020-01-31 ENCOUNTER — Other Ambulatory Visit: Payer: Self-pay

## 2020-01-31 ENCOUNTER — Other Ambulatory Visit (HOSPITAL_COMMUNITY)
Admission: RE | Admit: 2020-01-31 | Discharge: 2020-01-31 | Disposition: A | Discharge status: Home / Self Care | Payer: Medicaid Other | Source: Ambulatory Visit | Attending: Family Medicine | Admitting: Family Medicine

## 2020-01-31 ENCOUNTER — Ambulatory Visit (INDEPENDENT_AMBULATORY_CARE_PROVIDER_SITE_OTHER): Payer: Medicaid Other | Admitting: Family Medicine

## 2020-01-31 VITALS — BP 107/69 | HR 116 | Wt 188.0 lb

## 2020-01-31 DIAGNOSIS — Z3490 Encounter for supervision of normal pregnancy, unspecified, unspecified trimester: Secondary | ICD-10-CM | POA: Diagnosis not present

## 2020-01-31 DIAGNOSIS — O99013 Anemia complicating pregnancy, third trimester: Secondary | ICD-10-CM

## 2020-01-31 DIAGNOSIS — D649 Anemia, unspecified: Secondary | ICD-10-CM

## 2020-01-31 DIAGNOSIS — Z3A37 37 weeks gestation of pregnancy: Secondary | ICD-10-CM

## 2020-01-31 DIAGNOSIS — R8271 Bacteriuria: Secondary | ICD-10-CM

## 2020-01-31 DIAGNOSIS — Z20828 Contact with and (suspected) exposure to other viral communicable diseases: Secondary | ICD-10-CM

## 2020-01-31 NOTE — Progress Notes (Signed)
   PRENATAL VISIT NOTE  Subjective:  Kaitlin Montgomery is a 24 y.o. G3P1011 at [redacted]w[redacted]d being seen today for ongoing prenatal care.  She is currently monitored for the following issues for this low-risk pregnancy and has Gall bladder polyp; Supervision of low-risk pregnancy; Group B streptococcal bacteriuria; Late prenatal care affecting pregnancy; Exposure to herpes simplex virus (HSV); and Anemia in pregnancy on their problem list.  Patient reports no complaints.  Contractions: Not present. Vag. Bleeding: None.  Movement: Present. Denies leaking of fluid.   The following portions of the patient's history were reviewed and updated as appropriate: allergies, current medications, past family history, past medical history, past social history, past surgical history and problem list.   Objective:   Vitals:   01/31/20 1612  BP: 107/69  Pulse: (!) 116  Weight: 188 lb (85.3 kg)    Fetal Status: Fetal Heart Rate (bpm): 138 Fundal Height: 38 cm Movement: Present  Presentation: Vertex  General:  Alert, oriented and cooperative. Patient is in no acute distress.  Skin: Skin is warm and dry. No rash noted.   Cardiovascular: Normal heart rate noted  Respiratory: Normal respiratory effort, no problems with respiration noted  Abdomen: Soft, gravid, appropriate for gestational age.  Pain/Pressure: Present     Pelvic: Cervical exam performed in the presence of a chaperone Dilation: 3.5 Effacement (%): 50 Station: -2  Extremities: Normal range of motion.  Edema: None  Mental Status: Normal mood and affect. Normal behavior. Normal judgment and thought content.   Assessment and Plan:  Pregnancy: G3P1011 at [redacted]w[redacted]d Kaitlin Montgomery was seen today for routine prenatal visit.  Diagnoses and all orders for this visit:  Encounter for supervision of low-risk pregnancy, antepartum -     Cervicovaginal ancillary only( [facility]) - GBS bacteria in urine; needs intrapartum ppx - Boy(debating inpt vs outpt), both,  Paragard - RTC in 1 week   Exposure to herpes simplex virus (HSV)       - See Lisa's note. Cont Valtrex. No lesions on exam.  Anemia during pregnancy in third trimester       - S/p 2 doses IV iron  Group B streptococcal bacteriuria       - D/w patient; needs intrapartum ppx  Term labor symptoms and general obstetric precautions including but not limited to vaginal bleeding, contractions, leaking of fluid and fetal movement were reviewed in detail with the patient. Please refer to After Visit Summary for other counseling recommendations.   Return in about 1 week (around 02/07/2020) for LOB; virtual okay .  No future appointments.  Joselyn Arrow, MD

## 2020-01-31 NOTE — Progress Notes (Signed)
Patient reports fetal movement and reports round ligament pain.

## 2020-02-02 LAB — CERVICOVAGINAL ANCILLARY ONLY
Chlamydia: NEGATIVE
Comment: NEGATIVE
Comment: NORMAL
Neisseria Gonorrhea: NEGATIVE

## 2020-02-07 ENCOUNTER — Telehealth (INDEPENDENT_AMBULATORY_CARE_PROVIDER_SITE_OTHER): Payer: Medicaid Other | Admitting: Obstetrics & Gynecology

## 2020-02-07 DIAGNOSIS — R8271 Bacteriuria: Secondary | ICD-10-CM

## 2020-02-07 DIAGNOSIS — O99013 Anemia complicating pregnancy, third trimester: Secondary | ICD-10-CM | POA: Diagnosis not present

## 2020-02-07 DIAGNOSIS — Z3A38 38 weeks gestation of pregnancy: Secondary | ICD-10-CM | POA: Diagnosis not present

## 2020-02-07 DIAGNOSIS — Z3490 Encounter for supervision of normal pregnancy, unspecified, unspecified trimester: Secondary | ICD-10-CM

## 2020-02-07 DIAGNOSIS — O0933 Supervision of pregnancy with insufficient antenatal care, third trimester: Secondary | ICD-10-CM | POA: Diagnosis not present

## 2020-02-07 DIAGNOSIS — O99820 Streptococcus B carrier state complicating pregnancy: Secondary | ICD-10-CM

## 2020-02-07 MED ORDER — PANTOPRAZOLE SODIUM 40 MG PO TBEC: 40.0000 mg | DELAYED_RELEASE_TABLET | Freq: Every day | ORAL | 1 refills | Status: DC

## 2020-02-07 NOTE — Progress Notes (Signed)
   TELEHEALTH VIRTUAL OBSTETRICS VISIT ENCOUNTER NOTE  I connected with Kaitlin Montgomery on 02/07/20 at  2:30 PM EDT by telephone at home and verified that I am speaking with the correct person using two identifiers.   I discussed the limitations, risks, security and privacy concerns of performing an evaluation and management service by telephone and the availability of in person appointments. I also discussed with the patient that there may be a patient responsible charge related to this service. The patient expressed understanding and agreed to proceed.  Subjective:  Kaitlin Montgomery is a 24 y.o. G3P1011 at [redacted]w[redacted]d being followed for ongoing prenatal care.  She is currently monitored for the following issues for this low-risk pregnancy and has Gall bladder polyp; Supervision of low-risk pregnancy; Group B streptococcal bacteriuria; Late prenatal care affecting pregnancy; Exposure to herpes simplex virus (HSV); and Anemia in pregnancy on their problem list.  Patient reports heartburn. Reports fetal movement. Denies any contractions, bleeding or leaking of fluid.   The following portions of the patient's history were reviewed and updated as appropriate: allergies, current medications, past family history, past medical history, past social history, past surgical history and problem list.   Objective:   General:  Alert, oriented and cooperative.   Mental Status: Normal mood and affect perceived. Normal judgment and thought content.  Rest of physical exam deferred due to type of encounter  Assessment and Plan:  Pregnancy: G3P1011 at [redacted]w[redacted]d 1. Anemia during pregnancy in third trimester S/p iron infusion  2. Late prenatal care affecting pregnancy in third trimester heartburn - pantoprazole (PROTONIX) 40 MG tablet; Take 1 tablet (40 mg total) by mouth daily.  Dispense: 30 tablet; Refill: 1  3. Encounter for supervision of low-risk pregnancy, antepartum   4. Group B streptococcal  bacteriuria Treat in labor  Term labor symptoms and general obstetric precautions including but not limited to vaginal bleeding, contractions, leaking of fluid and fetal movement were reviewed in detail with the patient.  I discussed the assessment and treatment plan with the patient. The patient was provided an opportunity to ask questions and all were answered. The patient agreed with the plan and demonstrated an understanding of the instructions. The patient was advised to call back or seek an in-person office evaluation/go to MAU at [facility] for any urgent or concerning symptoms. Please refer to After Visit Summary for other counseling recommendations.   I provided 11 minutes of non-face-to-face time during this encounter.  Return in about 1 week (around 02/14/2020) for in person.  No future appointments.  Scheryl Darter, MD [facility], [facility]

## 2020-02-07 NOTE — Progress Notes (Signed)
Pt does not have BP cuff today.

## 2020-02-07 NOTE — Patient Instructions (Signed)
[facility] [address], [address] [place] [phone]  [facility] [address] [place] [phone] [facility] [address] [phone]  [facility] Main: [address] [phone] West: [address] [phone]  [facility] [address], [place] [phone]  [facility] [address], [place] [phone]  [facility] [address], [place] [phone]  [facility] [place]: [address], [address] [phone] [place]: [address], [address] [phone] [place]: [address], [address] [phone] [place]: [address] [phone]  [facility] [address], [place] [phone]  [facility] [address], [address] [phone]  [facility] [address] [phone]  [facility] [address], [place] [phone]  [facility] [address], [place] [phone]  [facility] [address], [place] [phone]  [facility] [address], [place] [phone]  [facility] @ [place]: [address], [place] [phone] FM @ [place]: [address], [place] [phone] Peds @ [place]: [address], [place] [phone] Peds @ [place]: [address], [place] [phone]  [facility] [address], [place] [phone]  [facility] [address], [place] [phone]  Private Pediatrician Jack Amos, MD [address], [place] [phone] Shilpa Gosrani, MD [address], [place] [phone] David Rubin, MD [address], [address] [phone] Nnaemeka-Okoyeh, MD ([facility]) [address], [place] [phone]        

## 2020-02-12 ENCOUNTER — Encounter: Payer: Self-pay | Admitting: Obstetrics and Gynecology

## 2020-02-12 ENCOUNTER — Other Ambulatory Visit: Payer: Self-pay

## 2020-02-12 ENCOUNTER — Encounter: Payer: Medicaid Other | Admitting: Obstetrics

## 2020-02-12 ENCOUNTER — Observation Stay
Admission: EM | Admit: 2020-02-12 | Discharge: 2020-02-12 | Disposition: A | Discharge status: Home / Self Care | Payer: Medicaid Other | Attending: Certified Nurse Midwife | Admitting: Certified Nurse Midwife

## 2020-02-12 DIAGNOSIS — Z3A38 38 weeks gestation of pregnancy: Secondary | ICD-10-CM | POA: Insufficient documentation

## 2020-02-12 DIAGNOSIS — R102 Pelvic and perineal pain: Secondary | ICD-10-CM | POA: Diagnosis present

## 2020-02-12 DIAGNOSIS — O26893 Other specified pregnancy related conditions, third trimester: Secondary | ICD-10-CM | POA: Diagnosis present

## 2020-02-12 NOTE — OB Triage Note (Signed)
Patient arrived with c/o sharp pains that shoot lower abdomen to vaginal area. Pains started after she was at a eas[place] egg hunt yesterday chasing after her daughter and leaning down a lot. She says it is mainly just when she moves or turns over. Patient denies leaking of fluids, vaginal bleeding, or contractions. Patient also states she has noticed decreased fetal movement. Patient was placed on FHR monitor and baby is category 1 FHR tracing, fetal movement observed and palpated. No contractions noted. Enis Gash, CNM evaluated patient in person, gave education on kick counts, preventing round ligament pain and sleeping with pillows between her knees. Provider reviewed FHR strip. Orders for discharge given.

## 2020-02-12 NOTE — Final Progress Note (Signed)
Physician Final Progress Note  Patient ID: Kaitlin Montgomery MRN: [id number] DOB/AGE: 23/11/1995 23 y.o.  Admit date: 02/12/2020 Admitting provider: Homero Fellers, MD/ [facility]. [place] Discharge date: 02/12/2020   Admission Diagnoses: IUP at 38wk6d with pelvic pain Decreased fetal movement  Discharge Diagnoses:  Active Problems:   Pelvic pain affecting pregnancy in third trimester, antepartum Reactive NST  Consults: None  Significant Findings/ Diagnostic Studies: 24 Year old G3 P1011 with EDC=02/20/2020 presented at 38wk6d with complaints of intermittent left lower quadrant pain and pain over suprapubic area when moving or turning in bed. She began noticing the sharp pains in the LLQ radiating to the vagina after an [place] egg hunt she attended with her daughter. She did a lot of bending over and walking yesterday. Denies vaginal bleeding, leakage of fluid or contractions. She has not felt the baby move as much today. She reports having round ligament previously in her pregnancy.   Prenatal care in [place] has been remarkable for iron deficiency anemia and GBS bacteremia and the following: Nursing Staff Provider  Office Location  [facility] Dating  11 week [place]  Language   English Anatomy US  28 weeks nl  Flu Vaccine  declined Genetic Screen  NIPS:low risks  AFP: negative  First Screen:  Quad:    TDaP vaccine   declined Hgb A1C or  GTT Early  Third trimester nl 2 hour  Rhogam     LAB RESULTS   Feeding Plan Both Blood Type O/Positive/-- (10/28 1409)   Contraception BTL vs. Paragard Antibody Negative (10/28 1409)  Circumcision Yes Rubella 3.79 (10/28 1409)  Pediatrician  undecided RPR Non Reactive (01/25 1128)   Support Person fiance HBsAg Negative (10/28 1409)   Prenatal Classes  HIV Non Reactive (01/25 1128)  BTL Consent  GBS  positive bacteriuria-- PCN IN LABOR  VBAC Consent  Pap  09/06/19    Hgb Electro   normal  BP Cuff sent CF Negative [place]    SMA Negative [place]     Waterbirth  [ ]  Class [ ]  Consent [ ]  CNM visit   Exam: General: smiling, gravid female in NAD Vital signs: BP 105/75 (BP Location: Left Arm)   Pulse (!) 106   Resp 16   LMP 05/16/2019   Abdomen: soft, tender to palpation over the border of her left lower uterine segment, cephalic presentation FHR: baseline 135 with accelerations to 150s, moderate variability Toco: no contractions noted Cervix: 3/70%/-1 per RN exam (no change from last exam on office)  A: IUP at 38wk6d with pelvic pain due to round ligament pain and pelvic laxity Reactive NST  P: Home with recommendations for comfort measures Follow up tomorrow at her [facility] provider as scheduled.   Procedures: NST  Discharge Condition: stable  Disposition: Discharge disposition: 01-Home or Self Care       Diet: Regular diet  Discharge Activity: Activity as tolerated   Allergies as of 02/12/2020   No Known Allergies     Medication List    TAKE these medications   acetaminophen 500 MG tablet Commonly known as: TYLENOL Take 500 mg by mouth as needed for headache.   Blood Pressure Monitor Devi Please check blood pressure 1-2 per week What changed: Another medication with the same name was removed. Continue taking this medication, and follow the directions you see here.   Blood Pressure Cuff Misc 1 Device by Does not apply route once a week. What changed: Another medication with the same name was removed. Continue  taking this medication, and follow the directions you see here.   docusate sodium 100 MG capsule Commonly known as: COLACE Take 1 capsule (100 mg total) by mouth 2 (two) times daily as needed.   ferrous fumarate 325 (106 Fe) MG Tabs tablet Commonly known as: HEMOCYTE - 106 mg FE Take 1 tablet (106 mg of iron total) by mouth every other day.   Prenate Mini 18-0.6-0.4-350 MG Caps Take 1 capsule by mouth daily.   valACYclovir 500 MG tablet Commonly known as: Valtrex Take 1 tablet (500 mg total) by  mouth 2 (two) times daily.        Total time spent taking care of this patient: 15 minutes  Signed: Farrel Conners 02/12/2020, 6:51 PM

## 2020-02-13 ENCOUNTER — Encounter (INDEPENDENT_AMBULATORY_CARE_PROVIDER_SITE_OTHER): Payer: Medicaid Other | Admitting: Obstetrics & Gynecology

## 2020-02-13 ENCOUNTER — Encounter: Payer: Self-pay | Admitting: Obstetrics

## 2020-02-13 ENCOUNTER — Ambulatory Visit (INDEPENDENT_AMBULATORY_CARE_PROVIDER_SITE_OTHER): Payer: Medicaid Other | Admitting: Obstetrics

## 2020-02-13 VITALS — BP 111/69 | HR 114 | Wt 190.0 lb

## 2020-02-13 DIAGNOSIS — Z3A39 39 weeks gestation of pregnancy: Secondary | ICD-10-CM

## 2020-02-13 DIAGNOSIS — O99013 Anemia complicating pregnancy, third trimester: Secondary | ICD-10-CM | POA: Diagnosis not present

## 2020-02-13 DIAGNOSIS — D649 Anemia, unspecified: Secondary | ICD-10-CM

## 2020-02-13 NOTE — Progress Notes (Signed)
Pt is here for ROB, [redacted]w[redacted]d.  

## 2020-02-13 NOTE — Progress Notes (Signed)
Subjective:  Kaitlin Montgomery is a 24 y.o. G3P1011 at [redacted]w[redacted]d being seen today for ongoing prenatal care.  She is currently monitored for the following issues for this low-risk pregnancy and has Gall bladder polyp; Supervision of low-risk pregnancy; Group B streptococcal bacteriuria; Late prenatal care affecting pregnancy; Exposure to herpes simplex virus (HSV); Anemia in pregnancy; and Pelvic pain affecting pregnancy in third trimester, antepartum on their problem list.  Patient reports no complaints.  Contractions: Irritability. Vag. Bleeding: None.  Movement: Present. Denies leaking of fluid.   The following portions of the patient's history were reviewed and updated as appropriate: allergies, current medications, past family history, past medical history, past social history, past surgical history and problem list. Problem list updated.  Objective:   Vitals:   02/13/20 1421  BP: 111/69  Pulse: (!) 114  Weight: 190 lb (86.2 kg)    Fetal Status:     Movement: Present     General:  Alert, oriented and cooperative. Patient is in no acute distress.  Skin: Skin is warm and dry. No rash noted.   Cardiovascular: Normal heart rate noted  Respiratory: Normal respiratory effort, no problems with respiration noted  Abdomen: Soft, gravid, appropriate for gestational age. Pain/Pressure: Present     Pelvic:  Cervical exam deferred        Extremities: Normal range of motion.  Edema: None  Mental Status: Normal mood and affect. Normal behavior. Normal judgment and thought content.   Urinalysis:      Assessment and Plan:  Pregnancy: G3P1011 at [redacted]w[redacted]d  1. Anemia affecting pregnancy in third trimester Rx: - CBC - Ferritin  Term labor symptoms and general obstetric precautions including but not limited to vaginal bleeding, contractions, leaking of fluid and fetal movement were reviewed in detail with the patient. Please refer to After Visit Summary for other counseling recommendations.  Return in  about 1 week (around 02/20/2020) for ROB.  NST.   Brock Bad, MD  02/13/2020

## 2020-02-14 ENCOUNTER — Encounter: Payer: Medicaid Other | Admitting: Obstetrics

## 2020-02-14 LAB — FERRITIN: Ferritin: 173 ng/mL — ABNORMAL HIGH (ref 15–150)

## 2020-02-14 LAB — CBC
Hematocrit: 31.9 % — ABNORMAL LOW (ref 34.0–46.6)
Hemoglobin: 10.7 g/dL — ABNORMAL LOW (ref 11.1–15.9)
MCH: 30.5 pg (ref 26.6–33.0)
MCHC: 33.5 g/dL (ref 31.5–35.7)
MCV: 91 fL (ref 79–97)
Platelets: 119 10*3/uL — ABNORMAL LOW (ref 150–450)
RBC: 3.51 x10E6/uL — ABNORMAL LOW (ref 3.77–5.28)
RDW: 14.6 % (ref 11.7–15.4)
WBC: 7.1 10*3/uL (ref 3.4–10.8)

## 2020-02-20 ENCOUNTER — Encounter: Payer: Medicaid Other | Admitting: Advanced Practice Midwife

## 2020-02-20 ENCOUNTER — Encounter: Payer: Medicaid Other | Admitting: Obstetrics & Gynecology

## 2020-02-26 ENCOUNTER — Inpatient Hospital Stay: Payer: Medicaid Other | Admitting: Anesthesiology

## 2020-02-26 ENCOUNTER — Other Ambulatory Visit: Payer: Self-pay

## 2020-02-26 ENCOUNTER — Inpatient Hospital Stay
Admission: EM | Admit: 2020-02-26 | Discharge: 2020-02-29 | DRG: 806 | Disposition: A | Discharge status: Home / Self Care | Payer: Medicaid Other | Attending: Obstetrics and Gynecology | Admitting: Obstetrics and Gynecology

## 2020-02-26 ENCOUNTER — Encounter: Payer: Self-pay | Admitting: Obstetrics and Gynecology

## 2020-02-26 ENCOUNTER — Encounter: Payer: Medicaid Other | Admitting: Obstetrics

## 2020-02-26 DIAGNOSIS — Z3A40 40 weeks gestation of pregnancy: Secondary | ICD-10-CM

## 2020-02-26 DIAGNOSIS — D649 Anemia, unspecified: Secondary | ICD-10-CM | POA: Diagnosis present

## 2020-02-26 DIAGNOSIS — K824 Cholesterolosis of gallbladder: Secondary | ICD-10-CM | POA: Diagnosis present

## 2020-02-26 DIAGNOSIS — O99613 Diseases of the digestive system complicating pregnancy, third trimester: Secondary | ICD-10-CM | POA: Diagnosis not present

## 2020-02-26 DIAGNOSIS — O99824 Streptococcus B carrier state complicating childbirth: Secondary | ICD-10-CM | POA: Diagnosis present

## 2020-02-26 DIAGNOSIS — O0933 Supervision of pregnancy with insufficient antenatal care, third trimester: Secondary | ICD-10-CM

## 2020-02-26 DIAGNOSIS — O480 Post-term pregnancy: Secondary | ICD-10-CM | POA: Diagnosis not present

## 2020-02-26 DIAGNOSIS — Z20822 Contact with and (suspected) exposure to covid-19: Secondary | ICD-10-CM | POA: Diagnosis present

## 2020-02-26 DIAGNOSIS — A6000 Herpesviral infection of urogenital system, unspecified: Secondary | ICD-10-CM | POA: Diagnosis present

## 2020-02-26 DIAGNOSIS — O9832 Other infections with a predominantly sexual mode of transmission complicating childbirth: Secondary | ICD-10-CM | POA: Diagnosis present

## 2020-02-26 DIAGNOSIS — Z3A41 41 weeks gestation of pregnancy: Secondary | ICD-10-CM | POA: Diagnosis not present

## 2020-02-26 DIAGNOSIS — O98813 Other maternal infectious and parasitic diseases complicating pregnancy, third trimester: Secondary | ICD-10-CM | POA: Diagnosis not present

## 2020-02-26 DIAGNOSIS — O2662 Liver and biliary tract disorders in childbirth: Principal | ICD-10-CM | POA: Diagnosis present

## 2020-02-26 DIAGNOSIS — O9902 Anemia complicating childbirth: Secondary | ICD-10-CM | POA: Diagnosis present

## 2020-02-26 DIAGNOSIS — O26893 Other specified pregnancy related conditions, third trimester: Secondary | ICD-10-CM | POA: Diagnosis present

## 2020-02-26 DIAGNOSIS — O99013 Anemia complicating pregnancy, third trimester: Principal | ICD-10-CM

## 2020-02-26 LAB — CBC
HCT: 34.6 % — ABNORMAL LOW (ref 36.0–46.0)
Hemoglobin: 11.6 g/dL — ABNORMAL LOW (ref 12.0–15.0)
MCH: 30.7 pg (ref 26.0–34.0)
MCHC: 33.5 g/dL (ref 30.0–36.0)
MCV: 91.5 fL (ref 80.0–100.0)
Platelets: 144 10*3/uL — ABNORMAL LOW (ref 150–400)
RBC: 3.78 MIL/uL — ABNORMAL LOW (ref 3.87–5.11)
RDW: 13.8 % (ref 11.5–15.5)
WBC: 8.2 10*3/uL (ref 4.0–10.5)
nRBC: 0 % (ref 0.0–0.2)

## 2020-02-26 LAB — RESPIRATORY PANEL BY RT PCR (FLU A&B, COVID)
Influenza A by PCR: NEGATIVE
Influenza B by PCR: NEGATIVE
SARS Coronavirus 2 by RT PCR: NEGATIVE

## 2020-02-26 MED ORDER — ACETAMINOPHEN 325 MG PO TABS
650.0000 mg | ORAL_TABLET | ORAL | Status: DC | PRN
  Administered 2020-02-27: 650 mg via ORAL
  Filled 2020-02-26 (×9): qty 2

## 2020-02-26 MED ORDER — OXYTOCIN 40 UNITS IN NORMAL SALINE INFUSION - SIMPLE MED
2.5000 [IU]/h | INTRAVENOUS | Status: DC
  Filled 2020-02-26: qty 1000

## 2020-02-26 MED ORDER — LIDOCAINE HCL (PF) 1 % IJ SOLN: 30.0000 mL | INTRAMUSCULAR | Status: DC | PRN

## 2020-02-26 MED ORDER — LACTATED RINGERS IV SOLN
500.0000 mL | INTRAVENOUS | Status: DC | PRN
  Administered 2020-02-26: 500 mL via INTRAVENOUS

## 2020-02-26 MED ORDER — ONDANSETRON HCL 4 MG/2ML IJ SOLN: 4.0000 mg | Freq: Four times a day (QID) | INTRAMUSCULAR | Status: DC | PRN

## 2020-02-26 MED ORDER — OXYTOCIN BOLUS FROM INFUSION
500.0000 mL | Freq: Once | INTRAVENOUS | Status: AC
  Administered 2020-02-27: 500 mL via INTRAVENOUS

## 2020-02-26 MED ORDER — AMMONIA AROMATIC IN INHA
RESPIRATORY_TRACT | Status: AC
  Filled 2020-02-26: qty 10

## 2020-02-26 MED ORDER — MISOPROSTOL 200 MCG PO TABS
ORAL_TABLET | ORAL | Status: AC
  Filled 2020-02-26: qty 4

## 2020-02-26 MED ORDER — LIDOCAINE HCL (PF) 1 % IJ SOLN
INTRAMUSCULAR | Status: AC
  Filled 2020-02-26: qty 30

## 2020-02-26 MED ORDER — SODIUM CHLORIDE 0.9 % IV SOLN
2.0000 g | Freq: Once | INTRAVENOUS | Status: AC
  Administered 2020-02-26: 23:00:00 2 g via INTRAVENOUS
  Filled 2020-02-26: qty 2000

## 2020-02-26 MED ORDER — LACTATED RINGERS IV SOLN: INTRAVENOUS | Status: DC

## 2020-02-26 MED ORDER — FENTANYL 2.5 MCG/ML W/ROPIVACAINE 0.15% IN NS 100 ML EPIDURAL (ARMC)
EPIDURAL | Status: AC
  Filled 2020-02-26: qty 100

## 2020-02-26 MED ORDER — OXYTOCIN 10 UNIT/ML IJ SOLN
INTRAMUSCULAR | Status: AC
  Filled 2020-02-26: qty 2

## 2020-02-26 NOTE — OB Triage Note (Signed)
Pt. Presents in triage with c/o of onset of CTXs since this morning.  +FM, no VB, no LOF.

## 2020-02-26 NOTE — H&P (Signed)
OB History & Physical   History of Present Illness:  Chief Complaint: contractions  HPI:  Kaitlin Montgomery is a 24 y.o. G70P1011 female at [redacted]w[redacted]d dated by 11 week ultrasound.  Her pregnancy has been complicated by gall bladder polyp, group b strep bacteriuria, late prenatal care, exposure to hsv, anemia.  She admits taking valtrex daily.   She reports contractions since this morning.   She denies leakage of fluid.   She denies vaginal bleeding.   She reports fetal movement.    Total weight gain for pregnancy: 12.7 kg   Obstetrical Problem List: lmp Problems (from 09/06/19 to present)    Problem Noted Resolved   Anemia in pregnancy 12/05/2019 by Catalina Antigua, MD No   Overview Signed 12/05/2019 11:55 AM by Catalina Antigua, MD    Feraheme ordered      Late prenatal care affecting pregnancy 09/19/2019 by Federico Flake, MD No   Overview Addendum 09/19/2019  5:19 PM by Federico Flake, MD    @20wk  by LMP but was [redacted]w[redacted]d by [redacted]w[redacted]d          Maternal Medical History:   Past Medical History:  Diagnosis Date  . Anxiety 06/10/2015  . Bronchitis   . Bronchitis   . Depressed mood 07/01/2015  . 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 Keloid 02/11/2015  . Seasonal allergies 04/09/2015    Past Surgical History:  Procedure Laterality Date  . EXTERNAL EAR SURGERY Right   . gallbalder      No Known Allergies  Prior to Admission medications   Medication Sig Start Date End Date Taking? Authorizing Provider  Prenat-FeCbn-FeAsp-Meth-FA-DHA (PRENATE MINI) 18-0.6-0.4-350 MG CAPS Take 1 capsule by mouth daily. 12/27/19  Yes Leftwich-Kirby, 12/29/19, CNM  valACYclovir (VALTREX) 500 MG tablet Take 1 tablet (500 mg total) by mouth 2 (two) times daily. Patient taking differently: Take 500 mg by mouth 2 (two) times daily.  01/10/20  Yes Leftwich-Kirby, 03/11/20, CNM  acetaminophen (TYLENOL) 500 MG tablet Take 500 mg by mouth as needed for headache.    [provider]  Blood Pressure Monitor  DEVI Please check blood pressure 1-2 per week Patient not taking: Reported on 12/27/2019 11/27/19   Leftwich-Kirby, 11/29/19, CNM  Blood Pressure Monitoring (BLOOD PRESSURE CUFF) MISC 1 Device by Does not apply route once a week. Patient not taking: Reported on 01/31/2020 12/27/19   12/29/19 A, CNM  docusate sodium (COLACE) 100 MG capsule Take 1 capsule (100 mg total) by mouth 2 (two) times daily as needed. Patient not taking: Reported on 01/31/2020 12/27/19   12/29/19 A, CNM  ferrous fumarate (HEMOCYTE - 106 MG FE) 325 (106 Fe) MG TABS tablet Take 1 tablet (106 mg of iron total) by mouth every other day. Patient not taking: Reported on 01/31/2020 12/27/19   Leftwich-Kirby, 12/29/19, CNM    OB History  Gravida Para Term Preterm AB Living  3 1 1   1 1   SAB TAB Ectopic Multiple Live Births  1     0 1    # Outcome Date GA Lbr Len/2nd Weight Sex Delivery Anes PTL Lv  3 Current           2 SAB 06/06/19 [redacted]w[redacted]d         1 Term 04/21/17 [redacted]w[redacted]d 05:41 / 00:48 3320 g F Vag-Spont EPI  LIV    Prenatal care site: [facility] in [place] She recently moved to [place] and has not been seen by her provider  in the past 2 weeks.  Social History: She  reports that she quit smoking about 3 years ago. Her smoking use included cigars. She has quit using smokeless tobacco. She reports that she does not drink alcohol or use drugs.  Family History: family history includes Cancer in her maternal grandmother.    Review of Systems:  Review of Systems  Constitutional: Negative.   HENT: Negative.   Eyes: Negative.   Respiratory: Negative.   Cardiovascular: Negative.   Gastrointestinal: Positive for abdominal pain.  Genitourinary: Negative.   Musculoskeletal: Negative.   Skin: Negative.   Neurological: Negative.   Endo/Heme/Allergies: Negative.   Psychiatric/Behavioral: Negative.      Physical Exam:  BP 133/73 (BP Location: Left Arm)   Pulse 96   Temp 98.3 F (36.8 C)  (Oral)   Resp (!) 22   Ht 5\' 7"  (1.702 m)   Wt 88.5 kg   LMP 05/16/2019   BMI 30.54 kg/m   Constitutional: Well nourished, well developed female in no acute distress.  HEENT: normal Skin: Warm and dry.  Cardiovascular: Regular rate and rhythm.   Extremity: no edema  Respiratory: Clear to auscultation bilateral. Normal respiratory effort Abdomen: FHT present Back: no CVAT Neuro: DTRs 2+, Cranial nerves grossly intact Psych: Alert and Oriented x3. No memory deficits. Normal mood and affect.  MS: normal gait, normal bilateral lower extremity ROM/strength/stability.  Pelvic exam: per RN 8/90/0  Exam by CNM: No active herpes lesions noted externally or internally    Pertinent Results:  Prenatal Labs Blood type/Rh O positive  Antibody screen negative  Rubella Immune  Varicella Unknown    RPR Non-reactive  HBsAg negative  HIV negative  GC negative  Chlamydia negative  Genetic screening AFP screen negative  1 hour GTT 2 hr gtt- all wnl  3 hour GTT NA  GBS GBS bacteriuria at NOB 10/28   Baseline FHR: 135 beats/min   Variability: moderate   Accelerations: present   Decelerations: absent Contractions: present frequency: every 3-4 minutes Overall assessment: reassuring   No results found for: SARSCOV2NAA] Covid test pending  Assessment:  Kaitlin Montgomery is a 24 y.o. G70P1011 female at [redacted]w[redacted]d with active labor.   Plan:  1. Admit to Labor & Delivery  2. CBC, T&S, Clrs, IVF 3. GBS positive bacteriuria, treat with 2 grams ampicillin given advanced dilation in multip.   4. Fetal well-being: Category I 5. Expectant management  Rod Can, CNM 02/26/2020 11:05 PM

## 2020-02-27 ENCOUNTER — Encounter: Payer: Self-pay | Admitting: Advanced Practice Midwife

## 2020-02-27 DIAGNOSIS — O99613 Diseases of the digestive system complicating pregnancy, third trimester: Secondary | ICD-10-CM

## 2020-02-27 DIAGNOSIS — O98813 Other maternal infectious and parasitic diseases complicating pregnancy, third trimester: Secondary | ICD-10-CM

## 2020-02-27 DIAGNOSIS — Z3A40 40 weeks gestation of pregnancy: Secondary | ICD-10-CM

## 2020-02-27 DIAGNOSIS — K824 Cholesterolosis of gallbladder: Secondary | ICD-10-CM

## 2020-02-27 LAB — CBC
HCT: 30.6 % — ABNORMAL LOW (ref 36.0–46.0)
Hemoglobin: 10.5 g/dL — ABNORMAL LOW (ref 12.0–15.0)
MCH: 30.8 pg (ref 26.0–34.0)
MCHC: 34.3 g/dL (ref 30.0–36.0)
MCV: 89.7 fL (ref 80.0–100.0)
Platelets: 122 10*3/uL — ABNORMAL LOW (ref 150–400)
RBC: 3.41 MIL/uL — ABNORMAL LOW (ref 3.87–5.11)
RDW: 13.6 % (ref 11.5–15.5)
WBC: 12.8 10*3/uL — ABNORMAL HIGH (ref 4.0–10.5)
nRBC: 0 % (ref 0.0–0.2)

## 2020-02-27 LAB — TYPE AND SCREEN
ABO/RH(D): O POS
Antibody Screen: NEGATIVE

## 2020-02-27 LAB — ABO/RH: ABO/RH(D): O POS

## 2020-02-27 MED ORDER — LIDOCAINE-EPINEPHRINE (PF) 1.5 %-1:200000 IJ SOLN
INTRAMUSCULAR | Status: DC | PRN
  Administered 2020-02-26: 3 mL via EPIDURAL

## 2020-02-27 MED ORDER — LIDOCAINE HCL (PF) 1 % IJ SOLN: INTRAMUSCULAR | Status: DC | PRN

## 2020-02-27 MED ORDER — ONDANSETRON HCL 4 MG/2ML IJ SOLN: 4.0000 mg | INTRAMUSCULAR | Status: DC | PRN

## 2020-02-27 MED ORDER — TETANUS-DIPHTH-ACELL PERTUSSIS 5-2.5-18.5 LF-MCG/0.5 IM SUSP: 0.5000 mL | Freq: Once | INTRAMUSCULAR | Status: DC

## 2020-02-27 MED ORDER — COCONUT OIL OIL
TOPICAL_OIL | Status: AC
  Filled 2020-02-27: qty 120

## 2020-02-27 MED ORDER — BENZOCAINE-MENTHOL 20-0.5 % EX AERO: 1.0000 "application " | INHALATION_SPRAY | CUTANEOUS | Status: DC | PRN

## 2020-02-27 MED ORDER — PRENATAL MULTIVITAMIN CH
1.0000 | ORAL_TABLET | Freq: Every day | ORAL | Status: DC
  Administered 2020-02-27 – 2020-02-28 (×2): 1 via ORAL
  Filled 2020-02-27 (×2): qty 1

## 2020-02-27 MED ORDER — FENTANYL 2.5 MCG/ML W/ROPIVACAINE 0.15% IN NS 100 ML EPIDURAL (ARMC): 12.0000 mL/h | EPIDURAL | Status: DC

## 2020-02-27 MED ORDER — PHENYLEPHRINE 40 MCG/ML (10ML) SYRINGE FOR IV PUSH (FOR BLOOD PRESSURE SUPPORT)
80.0000 ug | PREFILLED_SYRINGE | INTRAVENOUS | Status: DC | PRN
  Filled 2020-02-27: qty 10

## 2020-02-27 MED ORDER — EPHEDRINE 5 MG/ML INJ
10.0000 mg | INTRAVENOUS | Status: DC | PRN
  Filled 2020-02-27: qty 2

## 2020-02-27 MED ORDER — COCONUT OIL OIL
1.0000 "application " | TOPICAL_OIL | Status: DC | PRN
  Administered 2020-02-27: 1 via TOPICAL
  Filled 2020-02-27: qty 120

## 2020-02-27 MED ORDER — DIPHENHYDRAMINE HCL 50 MG/ML IJ SOLN: 12.5000 mg | INTRAMUSCULAR | Status: DC | PRN

## 2020-02-27 MED ORDER — ONDANSETRON HCL 4 MG PO TABS: 4.0000 mg | ORAL_TABLET | ORAL | Status: DC | PRN

## 2020-02-27 MED ORDER — FENTANYL 2.5 MCG/ML W/ROPIVACAINE 0.15% IN NS 100 ML EPIDURAL (ARMC)
EPIDURAL | Status: DC | PRN
  Administered 2020-02-27: 12 mL/h via EPIDURAL

## 2020-02-27 MED ORDER — DIBUCAINE (PERIANAL) 1 % EX OINT: 1.0000 "application " | TOPICAL_OINTMENT | CUTANEOUS | Status: DC | PRN

## 2020-02-27 MED ORDER — SIMETHICONE 80 MG PO CHEW: 80.0000 mg | CHEWABLE_TABLET | ORAL | Status: DC | PRN

## 2020-02-27 MED ORDER — SODIUM CHLORIDE 0.9 % IV SOLN
INTRAVENOUS | Status: DC | PRN
  Administered 2020-02-27: 10 mL via EPIDURAL

## 2020-02-27 MED ORDER — LACTATED RINGERS IV SOLN: 500.0000 mL | Freq: Once | INTRAVENOUS | Status: DC

## 2020-02-27 MED ORDER — DIPHENHYDRAMINE HCL 25 MG PO CAPS: 25.0000 mg | ORAL_CAPSULE | Freq: Four times a day (QID) | ORAL | Status: DC | PRN

## 2020-02-27 MED ORDER — SENNOSIDES-DOCUSATE SODIUM 8.6-50 MG PO TABS
2.0000 | ORAL_TABLET | ORAL | Status: DC
  Administered 2020-02-28 – 2020-02-29 (×2): 2 via ORAL
  Filled 2020-02-27 (×2): qty 2

## 2020-02-27 MED ORDER — WITCH HAZEL-GLYCERIN EX PADS: 1.0000 "application " | MEDICATED_PAD | CUTANEOUS | Status: DC | PRN

## 2020-02-27 MED ORDER — ACETAMINOPHEN 325 MG PO TABS
650.0000 mg | ORAL_TABLET | ORAL | Status: DC | PRN
  Administered 2020-02-27 – 2020-02-29 (×8): 650 mg via ORAL
  Filled 2020-02-27 (×2): qty 2

## 2020-02-27 MED ORDER — IBUPROFEN 600 MG PO TABS
600.0000 mg | ORAL_TABLET | Freq: Four times a day (QID) | ORAL | Status: DC
  Administered 2020-02-27 – 2020-02-28 (×6): 600 mg via ORAL
  Filled 2020-02-27 (×7): qty 1

## 2020-02-27 MED ORDER — LIDOCAINE HCL (PF) 1 % IJ SOLN
INTRAMUSCULAR | Status: DC | PRN
  Administered 2020-02-26: 3 mL

## 2020-02-27 NOTE — Anesthesia Postprocedure Evaluation (Signed)
Anesthesia Post Note  Patient: Sales executive  Procedure(s) Performed: AN AD HOC LABOR EPIDURAL  Patient location during evaluation: Mother Baby Anesthesia Type: Epidural Level of consciousness: oriented and awake and alert Pain management: pain level controlled Vital Signs Assessment: post-procedure vital signs reviewed and stable Respiratory status: spontaneous breathing and respiratory function stable Cardiovascular status: blood pressure returned to baseline and stable Postop Assessment: no headache, no backache, no apparent nausea or vomiting and able to ambulate Anesthetic complications: no     Last Vitals:  Vitals:   02/27/20 0400 02/27/20 0521  BP: (!) 103/55 (!) 109/57  Pulse: 80 76  Resp: 18 17  Temp: 37 C 37.3 C  SpO2: 100% 97%    Last Pain:  Vitals:   02/27/20 0521  TempSrc: Oral  PainSc:                  Kaitlin Montgomery

## 2020-02-27 NOTE — Anesthesia Preprocedure Evaluation (Signed)
Anesthesia Evaluation  Patient identified by MRN, date of birth, ID band Patient awake    Reviewed: Allergy & Precautions, NPO status , Patient's Chart, lab work & pertinent test results  History of Anesthesia Complications Negative for: history of anesthetic complications  Airway Mallampati: II  TM Distance: >3 FB Neck ROM: Full    Dental no notable dental hx. (+) Teeth Intact   Pulmonary neg sleep apnea, neg COPD, Patient abstained from smoking.Not current smoker, former smoker,  Bronchitis/asthma   Pulmonary exam normal breath sounds clear to auscultation       Cardiovascular Exercise Tolerance: Good METS(-) hypertension(-) CAD and (-) Past MI negative cardio ROS  (-) dysrhythmias  Rhythm:Regular Rate:Normal - Systolic murmurs    Neuro/Psych PSYCHIATRIC DISORDERS Anxiety negative neurological ROS     GI/Hepatic neg GERD  ,(+)     (-) substance abuse  ,   Endo/Other  neg diabetes  Renal/GU negative Renal ROS     Musculoskeletal   Abdominal   Peds  Hematology  (+) anemia , No hx easy bleeding or bruising   Anesthesia Other Findings Past Medical History: 06/10/2015: Anxiety No date: Bronchitis No date: Bronchitis 07/01/2015: Depressed mood 04/09/2015: Exercise-induced asthma 02/11/2015: Keloid 04/09/2015: Seasonal allergies  Reproductive/Obstetrics (+) Pregnancy                             Anesthesia Physical Anesthesia Plan  ASA: II  Anesthesia Plan: Epidural   Post-op Pain Management:    Induction:   PONV Risk Score and Plan: 3 and Treatment may vary due to age or medical condition and Ondansetron  Airway Management Planned: Natural Airway  Additional Equipment:   Intra-op Plan:   Post-operative Plan:   Informed Consent: I have reviewed the patients History and Physical, chart, labs and discussed the procedure including the risks, benefits and alternatives for the  proposed anesthesia with the patient or authorized representative who has indicated his/her understanding and acceptance.       Plan Discussed with: Surgeon  Anesthesia Plan Comments: (Discussed R/B/A of neuraxial anesthesia technique with patient: - rare risks of spinal/epidural hematoma, nerve damage, infection - Risk of PDPH - Risk of itching - Risk of nausea and vomiting - Risk of poor block necessitating replacement of epidural. Patient voiced understanding.)        Anesthesia Quick Evaluation

## 2020-02-27 NOTE — Discharge Summary (Signed)
OB Discharge Summary     Patient Name: Kaitlin Montgomery DOB: 1996-02-09 MRN: [id number]  Date of admission: 02/26/2020 Delivering provider: Tresea Mall, CNM  Date of Delivery: 02/27/2020  Date of discharge: 02/29/2020  Admitting diagnosis: Indication for care in labor or delivery [O75.9], contractions Intrauterine pregnancy: [redacted]w[redacted]d     Secondary diagnosis: None     Discharge diagnosis: Term Pregnancy Delivered                                                                                                Post partum procedures:none  Augmentation: none  Complications: None  Hospital course:  Onset of Labor With Vaginal Delivery      24 y.o. yo G3P1011 at [redacted]w[redacted]d was admitted in Active Labor on 02/26/2020.  Patient had an uncomplicated labor course as follows:  Membrane Rupture Time/Date: 12:52 AM ,02/27/2020   Patient had a delivery of a Viable infant 1:19 AM, 02/27/2020 Please see delivery note for details  Patient had an uncomplicated postpartum course.    Patient is discharged home in stable condition on 02/29/20.   Physical exam  Vitals:   02/28/20 0759 02/28/20 1556 02/28/20 2347 02/29/20 0805  BP: 100/65 104/72 102/69 118/81  Pulse: 82 75 81 84  Resp: 20 20 18 18   Temp: 98.4 F (36.9 C) 98 F (36.7 C) 97.7 F (36.5 C) 97.7 F (36.5 C)  TempSrc: Oral Oral Oral Oral  SpO2: 98% 98% 99% 100%  Weight:      Height:       General: alert, cooperative and no distress Lochia: appropriate Uterine Fundus: firm Incision: N/A DVT Evaluation: No evidence of DVT seen on physical exam.  Labs: Lab Results  Component Value Date   WBC 12.8 (H) 02/27/2020   HGB 10.5 (L) 02/27/2020   HCT 30.6 (L) 02/27/2020   MCV 89.7 02/27/2020   PLT 122 (L) 02/27/2020    Discharge instruction: per After Visit Summary.  Medications:  Allergies as of 02/29/2020   No Known Allergies     Medication List    STOP taking these medications   valACYclovir 500 MG tablet Commonly known as:  Valtrex     TAKE these medications   acetaminophen 500 MG tablet Commonly known as: TYLENOL Take 500 mg by mouth as needed for headache.   Blood Pressure Monitor Devi Please check blood pressure 1-2 per week   Blood Pressure Cuff Misc 1 Device by Does not apply route once a week.   docusate sodium 100 MG capsule Commonly known as: COLACE Take 1 capsule (100 mg total) by mouth 2 (two) times daily as needed.   ferrous fumarate 325 (106 Fe) MG Tabs tablet Commonly known as: HEMOCYTE - 106 mg FE Take 1 tablet (106 mg of iron total) by mouth every other day.   Prenate Mini 18-0.6-0.4-350 MG Caps Take 1 capsule by mouth daily.       Diet: home with mother  Activity: Advance as tolerated. Pelvic rest for 6 weeks.   Outpatient follow up: Follow-up Information    [facility]. Schedule an appointment as soon  as possible for a visit in 6 week(s).   Specialty: Obstetrics and Gynecology Why: Please call to schedule a 6 week postpartum follow up appointment Contact information: [address] [place] [phone]            Postpartum contraception: IUD Paragard Rhogam Given postpartum: no Rubella vaccine given postpartum: immune Varicella vaccine given postpartum: no TDaP given antepartum or postpartum: ordered at discharge  Newborn Data: Live born female Birth Weight: 4800 g, 10 pounds 9 ounces   APGAR: 8, 9  Newborn Delivery   Birth date/time: 02/27/2020 01:19:00 Delivery type: Vaginal, Spontaneous       Baby Feeding: breast and formula  Disposition:home with mother  SIGNED:  Malachy Mood, MD 02/29/2020 9:17 AM

## 2020-02-27 NOTE — Anesthesia Procedure Notes (Signed)
Epidural Patient location during procedure: OB Start time: 02/26/2020 11:47 PM End time: 02/27/2020 12:11 AM  Staffing Anesthesiologist: Corinda Gubler, MD Performed: anesthesiologist   Preanesthetic Checklist Completed: patient identified, IV checked, site marked, risks and benefits discussed, surgical consent, monitors and equipment checked, pre-op evaluation and timeout performed  Epidural Patient position: sitting Prep: ChloraPrep Patient monitoring: heart rate, continuous pulse ox and blood pressure Approach: midline Location: L3-L4 Injection technique: LOR saline  Needle:  Needle type: Tuohy  Needle gauge: 17 G Needle length: 9 cm and 9 Needle insertion depth: 5 cm Catheter type: closed end flexible Catheter size: 19 Gauge Catheter at skin depth: 10 cm Test dose: negative and 1.5% lidocaine with Epi 1:200 K  Assessment Sensory level: T10 Events: blood not aspirated, injection not painful, no injection resistance, no paresthesia and negative IV test  Additional Notes 1st attempt Pt. Evaluated and documentation done after procedure finished. Patient identified. Risks/Benefits/Options discussed with patient including but not limited to bleeding, infection, nerve damage, paralysis, failed block, incomplete pain control, headache, blood pressure changes, nausea, vomiting, reactions to medication both or allergic, itching and postpartum back pain. Confirmed with bedside nurse the patient's most recent platelet count. Confirmed with patient that they are not currently taking any anticoagulation, have any bleeding history or any family history of bleeding disorders. Patient expressed understanding and wished to proceed. All questions were answered. Sterile technique was used throughout the entire procedure. Please see nursing notes for vital signs. Test dose was given through epidural catheter and negative prior to continuing to dose epidural or start infusion. Warning signs of high  block given to the patient including shortness of breath, tingling/numbness in hands, complete motor block, or any concerning symptoms with instructions to call for help. Patient was given instructions on fall risk and not to get out of bed. All questions and concerns addressed with instructions to call with any issues or inadequate analgesia.   Patient tolerated the insertion well without immediate complications.Reason for block:procedure for pain

## 2020-02-27 NOTE — Lactation Note (Signed)
This note was copied from a baby's chart. Lactation Consultation Note  Patient Name: Kaitlin Montgomery Montgomery WIOMB'T Date: 02/27/2020    [facility] student entered room for follow-up assessment. Upon entering, [name] present actively nursing infant Kaitlin Montgomery.   Infant Kaitlin Montgomery actively nursing on left breast in cross-cradle hold. Audible swallows noted. [facility] student went over position options, supply and demand, reiterated pump set up and how to clean pump. [facility] student taught hand expression, colostrum easily expressed on left breast. [name] wanted to clarify pump usage. [facility] student went over how to use DEBP, size 22mm flange was notably comfortable for [name]. After infant finished nursing on left breast, LC student recommended getting infant latched on right breast. [name] able to get infant successfully latched in football hold on right breast. Audible swallows noted on right side.   [facility] student went over feeding plan: offer breast first, then follow up with formula. [name] still is unsure whether she would like to alternate feeds or just feed formula at night. [facility] student reiterated the importance of supply and demand. LC student recommended [name] to use DEBP when offering formula to help establish and protect milk supply. [name] states that she has no other breastfeeding questions at this time.   LC student encouraged [name] to follow hunger cues and feed infant on demand. Feed infant 8 times in a 24 hour period. [facility] student gave outpatient lactation information and breastfeeding support group information. Lactation to follow up on 04/21.    Maternal Data Has patient been taught Hand Expression?: Yes Does the patient have breastfeeding experience prior to this delivery?: Yes  Feeding Feeding Type: Breast Fed  LATCH Score Latch: Grasps breast easily, tongue down, lips flanged, rhythmical sucking.  Audible Swallowing: Spontaneous and intermittent  Type of Nipple: Everted at rest and after stimulation  Comfort (Breast/Nipple): Soft /  non-tender  Hold (Positioning): Assistance needed to correctly position infant at breast and maintain latch.  LATCH Score: 9  Interventions Interventions: Breast feeding basics reviewed;Assisted with latch;Breast compression;Adjust position;Support pillows;Position options;Expressed milk;Coconut oil;Hand express  Lactation Tools Discussed/Used Tools: Pump;Coconut oil Breast pump type: Double-Electric Breast Pump WIC Program: Yes Pump Review: Setup, frequency, and cleaning Initiated by:: Carlin Attridge D. Yetta Barre Date initiated:: 02/27/20   Consult Status Consult Status: Follow-up Date: 02/28/20 Follow-up type: In-patient    Jimmye Norman 02/27/2020, 3:33 PM

## 2020-02-27 NOTE — Lactation Note (Signed)
This note was copied from a baby's chart. Lactation Consultation Note  Patient Name: Kaitlin Montgomery UYQIH'K Date: 02/27/2020 Reason for consult: Initial assessment  [facility] student entered room for initial assessment. Upon entering room, [name] present, infant napping skin to skin, not showing hunger cues.   [facility] student went over breastfeeding basics with [name] and the importance of supply and demand. [name] plans to breast and bottle feed. [name] had concerns of low milk supply with first child, but does report 2 years of breastfeeding experience. [name] used hand pump with first child.   [name] does report some breast/nipple tenderness and already has coconut oil to use. LC student set up [name] with DEBP, went over how to clean, and properly use DEBP.   LC student encouraged [name] to follow hunger cues, feed on demand and feed infant at least 8 times in a 24 hour period. LC student asked [name] to call out when infant starts to show hunger cues for next feed. [name] agreed to call out at next feed.  LC student to follow-up.   Maternal Data    Feeding Feeding Type: Breast Milk with Formula added  LATCH Score                   Interventions Interventions: Breast feeding basics reviewed;Skin to skin;Coconut oil;DEBP  Lactation Tools Discussed/Used Tools: Pump;Coconut oil Initiated by:: Shanica Castellanos D. Yetta Barre Lactation Student Date initiated:: 02/27/20   Consult Status Consult Status: Follow-up Date: 02/27/20 Follow-up type: In-patient    Jimmye Norman 02/27/2020, 10:05 AM

## 2020-02-27 NOTE — Progress Notes (Signed)
Admit Date: 02/26/2020 Today's Date: 02/27/2020  Post Partum Day 0 (delivered earlier this am)  Subjective:  no complaints, up ad lib, voiding and tolerating PO  Objective: Temp:  [98.3 F (36.8 C)-99.2 F (37.3 C)] 98.7 F (37.1 C) (04/20 0757) Pulse Rate:  [70-108] 74 (04/20 0757) Resp:  [17-22] 20 (04/20 0757) BP: (89-133)/(46-107) 108/74 (04/20 0757) SpO2:  [97 %-100 %] 99 % (04/20 0757) Weight:  [88.5 kg] 88.5 kg (04/19 2240)  Physical Exam:  General: alert, cooperative and no distress Lochia: appropriate Uterine Fundus: firm Incision: none DVT Evaluation: No evidence of DVT seen on physical exam.  Recent Labs    02/26/20 2258 02/27/20 0506  HGB 11.6* 10.5*  HCT 34.6* 30.6*    Assessment/Plan: Infant doing well  GBS, one dose ABX given, plans are to monitor infant for 48 hours  Considering Paraguard IUD for PP contraception Mild anemia, take iron pills daily for one month PNV also   LOS: 1 day   Letitia Libra [facility] 02/27/2020, 10:27 AM

## 2020-02-28 DIAGNOSIS — Z3A41 41 weeks gestation of pregnancy: Secondary | ICD-10-CM

## 2020-02-28 DIAGNOSIS — O480 Post-term pregnancy: Secondary | ICD-10-CM

## 2020-02-28 LAB — RPR: RPR Ser Ql: NONREACTIVE

## 2020-02-28 MED ORDER — IBUPROFEN 600 MG PO TABS
600.0000 mg | ORAL_TABLET | Freq: Four times a day (QID) | ORAL | Status: DC
  Administered 2020-02-28 – 2020-02-29 (×2): 600 mg via ORAL
  Filled 2020-02-28 (×2): qty 1

## 2020-02-28 NOTE — Lactation Note (Signed)
This note was copied from a baby's chart. Lactation Consultation Note  Patient Name: Boy Amiayah Giebel IRJJO'A Date: 02/28/2020 Reason for consult: Follow-up assessment;Mother's request;Difficult latch;Term;Other (Comment)(LGA)  Mom called out for [facility] support with baby at the breast. Mom reported pain started last night with latching baby, and right breast is sore/tender.  LC discussed position options, importance of support pillows, and bringing baby to breast instead of breast to baby. LC turned baby tummy to tummy, and waited for wide open mouth. Baby latched easily, flanged top/bottom lips, and strong rhythmic sucking. Mom notes no pain or discomfort with this latch, and LC reiterated importance of positioning of infant.  Encouraged mom to use expressed colostrum and coconut oil on sore/tender right nipple to aid in comfort and healing.  Mom will remain in hospital today, due to untreated GBS+, encouraged her to continue to seek breastfeeding support as needed.  Maternal Data Formula Feeding for Exclusion: No Has patient been taught Hand Expression?: Yes Does the patient have breastfeeding experience prior to this delivery?: Yes  Feeding Feeding Type: Breast Fed  LATCH Score Latch: Grasps breast easily, tongue down, lips flanged, rhythmical sucking.  Audible Swallowing: A few with stimulation  Type of Nipple: Everted at rest and after stimulation  Comfort (Breast/Nipple): Soft / non-tender  Hold (Positioning): Assistance needed to correctly position infant at breast and maintain latch.  LATCH Score: 8  Interventions Interventions: Breast feeding basics reviewed;Assisted with latch;Adjust position;Support pillows  Lactation Tools Discussed/Used     Consult Status Consult Status: PRN Date: 02/28/20 Follow-up type: Call as needed    Danford Bad 02/28/2020, 9:48 AM

## 2020-02-28 NOTE — Discharge Instructions (Signed)

## 2020-02-28 NOTE — Progress Notes (Addendum)
Post Partum Day 1 Subjective: no complaints, up ad lib, voiding, tolerating PO and breast and bottle feeding  Objective: Blood pressure 100/65, pulse 82, temperature 98.4 F (36.9 C), temperature source Oral, resp. rate 20, height 5\' 7"  (1.702 m), weight 88.5 kg, last menstrual period 05/16/2019, SpO2 98 %, unknown if currently breastfeeding.  Physical Exam:  General: alert, cooperative and no distress Lochia: appropriate Uterine Fundus: firm, U-1/ML/NT  DVT Evaluation: No evidence of DVT seen on physical exam.  Recent Labs    02/26/20 2258 02/27/20 0506  HGB 11.6* 10.5*  HCT 34.6* 30.6*  WBC 8.2 12.8*  PLT 144* 122*    Assessment/Plan: Stable PPD #1-continue PP care  Baby still being observed for GBS-no adequately treated in labor Mild anemia: continue vitamins with iron Breast and bottle Contraception: ?Paragard Last TDAP: 2018 O positive/ RI Plan discharge for tomorrow      LOS: 2 days   2019 02/28/2020, 8:27 AM

## 2020-02-29 NOTE — Lactation Note (Signed)
This note was copied from a baby's chart. Lactation Consultation Note  Patient Name: Kaitlin Montgomery Today's Date: 02/29/2020     Maternal Data Formula Feeding for Exclusion: Yes Reason for exclusion: Mother's choice to formula and breast feed on admission Has patient been taught Hand Expression?: Yes Does the patient have breastfeeding experience prior to this delivery?: Yes  Feeding Feeding Type: Bottle Fed - Formula Nipple Type: Slow - flow  LATCH Score                   Interventions    Lactation Tools Discussed/Used     Consult Status  LC talked with parents about progression of breastfeeding. Mother states that she is thinking of quitting because the latch is too painful. LC gave mother information on breastfeeding support group and lactation contact info in case she needs support upon arriving home. LC educated parents on how to mix formula. Parents had questions on formula education that were answered by lactation.    Kaitlin Montgomery 02/29/2020, 10:07 AM

## 2020-02-29 NOTE — Progress Notes (Signed)
Discharge order received from doctor. Reviewed discharge instructions and prescriptions with patient and answered all questions. Follow up appointment instructions given. Patient verbalized understanding. ID bands checked. Patient discharged home with infant via wheelchair by nursing/auxillary.    Gregory Dowe Garner, RN  

## 2020-03-14 DIAGNOSIS — G5603 Carpal tunnel syndrome, bilateral upper limbs: Secondary | ICD-10-CM | POA: Insufficient documentation

## 2020-03-14 DIAGNOSIS — M654 Radial styloid tenosynovitis [de Quervain]: Secondary | ICD-10-CM | POA: Insufficient documentation

## 2020-05-23 ENCOUNTER — Encounter: Payer: Self-pay | Admitting: Obstetrics

## 2020-05-23 ENCOUNTER — Other Ambulatory Visit: Payer: Self-pay

## 2020-05-23 ENCOUNTER — Ambulatory Visit (INDEPENDENT_AMBULATORY_CARE_PROVIDER_SITE_OTHER): Payer: Medicaid Other | Admitting: Obstetrics

## 2020-05-23 VITALS — BP 133/81 | HR 112 | Ht 67.0 in | Wt 193.0 lb

## 2020-05-23 DIAGNOSIS — N63 Unspecified lump in unspecified breast: Secondary | ICD-10-CM

## 2020-05-23 DIAGNOSIS — Z3009 Encounter for other general counseling and advice on contraception: Secondary | ICD-10-CM

## 2020-05-23 DIAGNOSIS — G8918 Other acute postprocedural pain: Secondary | ICD-10-CM | POA: Diagnosis not present

## 2020-05-23 NOTE — Progress Notes (Signed)
Pt is in the office for [facility] consult.  She is considering Paraguard IUD for [facility] Pt states that she has abscess on areola of left breast. Pt is currently breastfeeding.

## 2020-05-23 NOTE — Progress Notes (Signed)
Subjective:    Kaitlin Montgomery is a 24 y.o. female who presents for contraception counseling.  Wants Mirena IUD. The patient has complaints of a nodule that has appeared adjacent to the nipple of left breast that interferes with the baby latching on to the nipple during breast feeding.  This nodule is non tender, soft, and does not express any fluid.  She does plan to continue breast feeding. Pertinent past medical history: none.  The information documented in the HPI was reviewed and verified.  Menstrual History: OB History    Gravida  3   Para  2   Term  2   Preterm      AB  1   Living  2     SAB  1   TAB      Ectopic      Multiple  0   Live Births  2            Patient's last menstrual period was 05/22/2020.   Patient Active Problem List   Diagnosis Date Noted  . Postpartum care following vaginal delivery 02/27/2020  . Normal spontaneous vaginal delivery   . Indication for care in labor or delivery 02/26/2020  . Pelvic pain affecting pregnancy in third trimester, antepartum 02/12/2020  . Anemia in pregnancy 12/05/2019  . 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 Late prenatal care affecting pregnancy 09/19/2019  . Group B streptococcal bacteriuria 09/18/2019  . Supervision of low-risk pregnancy 09/06/2019  . Gall bladder polyp 12/30/2016   Past Medical History:  Diagnosis Date  . Anxiety 06/10/2015  . Bronchitis   . Bronchitis   . Depressed mood 07/01/2015  . 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 Keloid 02/11/2015  . Seasonal allergies 04/09/2015    Past Surgical History:  Procedure Laterality Date  . EXTERNAL EAR SURGERY Right   . gallbalder       Current Outpatient Medications:  .  acetaminophen (TYLENOL) 500 MG tablet, Take 500 mg by mouth as needed for headache. (Patient not taking: Reported on 05/23/2020), Disp: , Rfl:  .  Blood Pressure Monitor DEVI, Please check blood pressure 1-2 per week (Patient not taking: Reported on 12/27/2019),  Disp: 1 each, Rfl: 0 .  Blood Pressure Monitoring (BLOOD PRESSURE CUFF) MISC, 1 Device by Does not apply route once a week. (Patient not taking: Reported on 01/31/2020), Disp: 1 each, Rfl: 0 .  docusate sodium (COLACE) 100 MG capsule, Take 1 capsule (100 mg total) by mouth 2 (two) times daily as needed. (Patient not taking: Reported on 01/31/2020), Disp: 30 capsule, Rfl: 2 .  ferrous fumarate (HEMOCYTE - 106 MG FE) 325 (106 Fe) MG TABS tablet, Take 1 tablet (106 mg of iron total) by mouth every other day. (Patient not taking: Reported on 01/31/2020), Disp: 30 tablet, Rfl: 2 .  Prenat-FeCbn-FeAsp-Meth-FA-DHA (PRENATE MINI) 18-0.6-0.4-350 MG CAPS, Take 1 capsule by mouth daily. (Patient not taking: Reported on 05/23/2020), Disp: 30 capsule, Rfl: 11 No Known Allergies  Social History   Tobacco Use  . Smoking status: Former Smoker    Types: Cigars    Quit date: 08/25/2016    Years since quitting: 3.7  . Smokeless tobacco: Former Engineer, water Use Topics  . Alcohol use: No    Alcohol/week: 0.0 standard drinks    Family History  Problem Relation Age of Onset  . Cancer Maternal Grandmother        Review of Systems Constitutional: negative for weight loss Genitourinary:negative for abnormal menstrual periods  and vaginal discharge   Objective:   BP 133/81   Pulse (!) 112   Ht 5\' 7"  (1.702 m)   Wt 193 lb (87.5 kg)   LMP 05/22/2020   Breastfeeding Yes   BMI 30.23 kg/m    General:   alert  Skin:   no rash or abnormalities  Lungs:   clear to auscultation bilaterally  Heart:   regular rate and rhythm, S1, S2 normal, no murmur, click, rub or gallop  Breasts:   normal without suspicious masses, skin or nipple changes or axillary nodes  Abdomen:  normal findings: no organomegaly, soft, non-tender and no hernia  Pelvis:  External genitalia: normal general appearance Urinary system: urethral meatus normal and bladder without fullness, nontender Vaginal: normal without tenderness,  induration or masses Cervix: normal appearance Adnexa: normal bimanual exam Uterus: anteverted and non-tender, normal size   Lab Review Urine pregnancy test Labs reviewed yes Radiologic studies reviewed no  50% of 20 min visit spent on counseling and coordination of care.    Assessment:    24 y.o., starting IUD, no contraindications.   Plan:    All questions answered. Contraception: IUD. Diagnosis explained in detail, including differential. Follow up in 1 week. for Mirena IUD insertion.   Orders Placed This Encounter  Procedures  . Ambulatory referral to General Surgery    Referral Priority:   Routine    Referral Type:   Surgical    Referral Reason:   Specialty Services Required    Requested Specialty:   General Surgery    Number of Visits Requested:   1    21, MD 05/23/2020 5:02 PM

## 2020-05-24 ENCOUNTER — Other Ambulatory Visit: Payer: Self-pay | Admitting: Obstetrics

## 2020-05-24 MED ORDER — IBUPROFEN 800 MG PO TABS: 800.0000 mg | ORAL_TABLET | Freq: Three times a day (TID) | ORAL | 5 refills | Status: DC | PRN

## 2020-05-24 NOTE — Addendum Note (Signed)
Addended by: Coral Ceo A on: 05/24/2020 01:51 AM   Modules accepted: Orders

## 2020-05-29 ENCOUNTER — Ambulatory Visit: Payer: Medicaid Other | Admitting: Obstetrics

## 2020-06-12 ENCOUNTER — Encounter: Payer: Self-pay | Admitting: Obstetrics

## 2020-06-12 ENCOUNTER — Ambulatory Visit (INDEPENDENT_AMBULATORY_CARE_PROVIDER_SITE_OTHER): Payer: Medicaid Other | Admitting: Obstetrics

## 2020-06-12 VITALS — BP 114/73 | HR 87 | Ht 67.0 in | Wt 194.6 lb

## 2020-06-12 DIAGNOSIS — Z30430 Encounter for insertion of intrauterine contraceptive device: Secondary | ICD-10-CM | POA: Diagnosis not present

## 2020-06-12 LAB — POCT URINE PREGNANCY: Preg Test, Ur: NEGATIVE

## 2020-06-12 NOTE — Progress Notes (Signed)
Pt. Presents for IUD insertion. Pt. Denies any unprotected intercourse in the last two weeks.  Upt done in office today - negative

## 2020-06-12 NOTE — Progress Notes (Signed)
Attempted ParaGuard IUD insertion, but the IUD was defective and did not release from tube. Patient wants to reschedule for a Mirena IUD insertion.in 1 week.  Brock Bad, MD 06/12/2020 4:27 PM

## 2020-06-19 ENCOUNTER — Ambulatory Visit: Payer: Medicaid Other | Admitting: Obstetrics

## 2020-06-27 ENCOUNTER — Ambulatory Visit: Payer: Medicaid Other | Admitting: Certified Nurse Midwife

## 2020-07-17 NOTE — Progress Notes (Deleted)
Patient, No Pcp Per   No chief complaint on file.   HPI:      [name] is a 24 y.o. [id number] whose LMP was No LMP recorded., presents today for NP breast abscess    Past Medical History:  Diagnosis Date  . Anxiety 06/10/2015  . Bronchitis   . Bronchitis   . Depressed mood 07/01/2015  . 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 Keloid 02/11/2015  . Seasonal allergies 04/09/2015    Past Surgical History:  Procedure Laterality Date  . EXTERNAL EAR SURGERY Right   . gallbalder      Family History  Problem Relation Age of Onset  . Cancer Maternal Grandmother     Social History   Socioeconomic History  . Marital status: Single    Spouse name: Not on file  . Number of children: 1  . Years of education: 5  . Highest education level: High school graduate  Occupational History  . Not on file  Tobacco Use  . Smoking status: Former Smoker    Types: Cigars    Quit date: 08/25/2016    Years since quitting: 3.8  . Smokeless tobacco: Former Clinical biochemist  . Vaping Use: Never used  Substance and Sexual Activity  . Alcohol use: No    Alcohol/week: 0.0 standard drinks  . Drug use: No  . Sexual activity: Yes    Partners: Male    Birth control/protection: I.U.D.  Other Topics Concern  . Not on file  Social History Narrative   Lives with mom , sister 32yrs, brother 13 yrs.  Other sister 84.  Was living in [place] for 3 years and now back in [place].  At [employer] in 10th grade.   Social Determinants of Health   Financial Resource Strain:   . Difficulty of Paying Living Expenses: Not on file  Food Insecurity:   . Worried About Programme researcher, broadcasting/film/video in the Last Year: Not on file  . Ran Out of Food in the Last Year: Not on file  Transportation Needs:   . Lack of Transportation (Medical): Not on file  . Lack of Transportation (Non-Medical): Not on file  Physical Activity:   . Days of Exercise per Week: Not on file  . Minutes of Exercise per Session: Not on  file  Stress:   . Feeling of Stress : Not on file  Social Connections:   . Frequency of Communication with Friends and Family: Not on file  . Frequency of Social Gatherings with Friends and Family: Not on file  . Attends Religious Services: Not on file  . Active Member of Clubs or Organizations: Not on file  . Attends Banker Meetings: Not on file  . Marital Status: Not on file  Intimate Partner Violence:   . Fear of Current or Ex-Partner: Not on file  . Emotionally Abused: Not on file  . Physically Abused: Not on file  . Sexually Abused: Not on file    Outpatient Medications Prior to Visit  Medication Sig Dispense Refill  . acetaminophen (TYLENOL) 500 MG tablet Take 500 mg by mouth as needed for headache. (Patient not taking: Reported on 05/23/2020)    . Blood Pressure Monitor DEVI Please check blood pressure 1-2 per week (Patient not taking: Reported on 12/27/2019) 1 each 0  . Blood Pressure Monitoring (BLOOD PRESSURE CUFF) MISC 1 Device by Does not apply route once a week. (Patient not taking: Reported on 01/31/2020) 1 each 0  .  docusate sodium (COLACE) 100 MG capsule Take 1 capsule (100 mg total) by mouth 2 (two) times daily as needed. (Patient not taking: Reported on 01/31/2020) 30 capsule 2  . ferrous fumarate (HEMOCYTE - 106 MG FE) 325 (106 Fe) MG TABS tablet Take 1 tablet (106 mg of iron total) by mouth every other day. (Patient not taking: Reported on 01/31/2020) 30 tablet 2  . ibuprofen (ADVIL) 800 MG tablet Take 1 tablet (800 mg total) by mouth every 8 (eight) hours as needed. 30 tablet 5  . Prenat-FeCbn-FeAsp-Meth-FA-DHA (PRENATE MINI) 18-0.6-0.4-350 MG CAPS Take 1 capsule by mouth daily. (Patient not taking: Reported on 05/23/2020) 30 capsule 11   No facility-administered medications prior to visit.      ROS:  Review of Systems BREAST: No symptoms   OBJECTIVE:   Vitals:  There were no vitals taken for this visit.  Physical Exam  Results: No results  found for this or any previous visit (from the past 24 hour(s)).   Assessment/Plan: No diagnosis found.    No orders of the defined types were placed in this encounter.     No follow-ups on file.  Keysha Damewood B. Lenox Bink, PA-C 07/17/2020 2:54 PM

## 2020-07-18 ENCOUNTER — Encounter: Payer: Self-pay | Admitting: Obstetrics and Gynecology

## 2020-07-25 ENCOUNTER — Other Ambulatory Visit: Payer: Self-pay

## 2020-07-25 ENCOUNTER — Ambulatory Visit (INDEPENDENT_AMBULATORY_CARE_PROVIDER_SITE_OTHER): Payer: Medicaid Other | Admitting: Obstetrics & Gynecology

## 2020-07-25 ENCOUNTER — Encounter: Payer: Self-pay | Admitting: Obstetrics & Gynecology

## 2020-07-25 VITALS — BP 120/80 | Ht 67.0 in | Wt 196.0 lb

## 2020-07-25 DIAGNOSIS — N649 Disorder of breast, unspecified: Secondary | ICD-10-CM | POA: Diagnosis not present

## 2020-07-25 DIAGNOSIS — Z3009 Encounter for other general counseling and advice on contraception: Secondary | ICD-10-CM

## 2020-07-25 NOTE — Progress Notes (Signed)
°  HPI:      [name] is a 24 y.o. [id number] who is postpartum 5 mos, breast feeding, presents today for a problem visit.  She complains of left areolar lesion, a raised lesion that has been present since the birth of her first child weveral years ago but now is more in the way of breast feeding her newborn son;  no pain, or discharge, or redness; has not changed in size. No fever.  Breast feeding well.  Did not cause sx's when not breast feeding.  PMHx: She  has a past medical history of Anxiety (06/10/2015), Bronchitis, Bronchitis, Depressed mood (07/01/2015), Exercise-induced asthma (04/09/2015), Keloid (02/11/2015), and Seasonal allergies (04/09/2015). Also,  has a past surgical history that includes External ear surgery (Right) and gallbalder., family history includes Cancer in her maternal grandmother.,  reports that she quit smoking about 3 years ago. Her smoking use included cigars. She has quit using smokeless tobacco. She reports that she does not drink alcohol and does not use drugs.  She has a current medication list which includes the following prescription(s): acetaminophen, blood pressure monitor, blood pressure cuff, docusate sodium, ferrous fumarate, ibuprofen, and prenate mini. Also, has No Known Allergies.  Review of Systems  All other systems reviewed and are negative.   Objective: BP 120/80    Ht 5\' 7"  (1.702 m)    Wt 196 lb (88.9 kg)    LMP 07/15/2020    BMI 30.70 kg/m  Physical Exam Constitutional:      General: She is not in acute distress.    Appearance: Normal appearance. She is well-developed.  Cardiovascular:     Rate and Rhythm: Normal rate and regular rhythm.     Pulses: Normal pulses.     Heart sounds: Normal heart sounds. No murmur heard.  No friction rub. No gallop.   Pulmonary:     Effort: Pulmonary effort is normal.     Breath sounds: Normal breath sounds.  Chest:     Chest wall: No mass, tenderness or edema.     Breasts:        Right: No inverted  nipple, mass, nipple discharge, skin change or tenderness.        Left: No inverted nipple, mass, nipple discharge, skin change or tenderness.    Musculoskeletal:        General: Normal range of motion.  Lymphadenopathy:     Upper Body:     Right upper body: No pectoral adenopathy.     Left upper body: No pectoral adenopathy.  Neurological:     Mental Status: She is alert and oriented to person, place, and time.  Skin:    General: Skin is warm and dry.     Findings: No abrasion, bruising, erythema, lesion or rash.  Psychiatric:        Speech: Speech normal.        Behavior: Behavior normal.        Judgment: Judgment normal.  Vitals reviewed.     ASSESSMENT/PLAN:  1. Breast lesion Can be excised by breast surgeon if causing problems, but otherwise appears benign.  Not infected.  Not causing pain.  2. Contraception options discussed.  Plans Mirena after next periods, for birth control and heavy period control.  Pros and cons and alternatives discussed.   09/14/2020, MD, [facility], [facility] 07/25/2020  2:44 PM

## 2020-12-11 ENCOUNTER — Other Ambulatory Visit: Payer: Self-pay | Admitting: Obstetrics and Gynecology

## 2020-12-11 ENCOUNTER — Other Ambulatory Visit (HOSPITAL_COMMUNITY)
Admission: RE | Admit: 2020-12-11 | Discharge: 2020-12-11 | Disposition: A | Discharge status: Home / Self Care | Payer: Medicaid Other | Source: Ambulatory Visit | Attending: Obstetrics and Gynecology | Admitting: Obstetrics and Gynecology

## 2020-12-11 ENCOUNTER — Other Ambulatory Visit: Payer: Self-pay

## 2020-12-11 ENCOUNTER — Ambulatory Visit (INDEPENDENT_AMBULATORY_CARE_PROVIDER_SITE_OTHER): Payer: Medicaid Other | Admitting: Obstetrics and Gynecology

## 2020-12-11 ENCOUNTER — Encounter: Payer: Self-pay | Admitting: Obstetrics and Gynecology

## 2020-12-11 ENCOUNTER — Telehealth: Payer: Self-pay

## 2020-12-11 VITALS — BP 110/80 | Ht 67.0 in | Wt 187.0 lb

## 2020-12-11 DIAGNOSIS — N760 Acute vaginitis: Secondary | ICD-10-CM

## 2020-12-11 DIAGNOSIS — Z113 Encounter for screening for infections with a predominantly sexual mode of transmission: Secondary | ICD-10-CM

## 2020-12-11 DIAGNOSIS — N898 Other specified noninflammatory disorders of vagina: Secondary | ICD-10-CM | POA: Diagnosis not present

## 2020-12-11 DIAGNOSIS — K429 Umbilical hernia without obstruction or gangrene: Secondary | ICD-10-CM

## 2020-12-11 DIAGNOSIS — B3731 Acute candidiasis of vulva and vagina: Secondary | ICD-10-CM

## 2020-12-11 DIAGNOSIS — B9689 Other specified bacterial agents as the cause of diseases classified elsewhere: Secondary | ICD-10-CM | POA: Diagnosis not present

## 2020-12-11 DIAGNOSIS — B373 Candidiasis of vulva and vagina: Secondary | ICD-10-CM

## 2020-12-11 LAB — POCT WET PREP WITH KOH
Clue Cells Wet Prep HPF POC: POSITIVE
KOH Prep POC: POSITIVE — AB
Trichomonas, UA: NEGATIVE
Yeast Wet Prep HPF POC: NEGATIVE

## 2020-12-11 MED ORDER — METRONIDAZOLE 0.75 % VA GEL: 1.0000 | Freq: Every day | VAGINAL | 0 refills | Status: AC

## 2020-12-11 MED ORDER — METRONIDAZOLE 500 MG PO TABS: 500.0000 mg | ORAL_TABLET | Freq: Two times a day (BID) | ORAL | 0 refills | Status: AC

## 2020-12-11 NOTE — Telephone Encounter (Signed)
Spoke with pt. Don't take flagyl with BF. Rx metrogel instead. Pt aware. No EtOH.

## 2020-12-11 NOTE — Patient Instructions (Signed)
I value your feedback and you entrusting us with your care. If you get a [facility] patient survey, I would appreciate you taking the time to let us know about your experience today. Thank you! ? ? ?

## 2020-12-11 NOTE — Progress Notes (Signed)
Rx metrogel for BV. Pt is Breastfeeding.

## 2020-12-11 NOTE — Telephone Encounter (Signed)
Pt calling; was seen today and given rx for BV; it is okay to take while breastfeeding?  [phone]

## 2020-12-11 NOTE — Progress Notes (Signed)
Patient, No Pcp Per   Chief Complaint  Patient presents with  . STD testing    routine    HPI:      [name] is a 25 y.o. [id number] whose LMP was Patient's last menstrual period was 12/04/2020 (approximate)., presents today for STD testing. No known exposures, just wants to be safe. No new partners; doesn't use condoms but plans to start. No hx of STDs. Would also like HIV and RPR done.  Pt has had increased d/c and odor, no irritation, off and on for 2 wks with mild pelvic discomfort. No urin sx, no LBP/fevers. Hx of BV in past. Uses scented soaps and wipes, no dryer sheets.  Pt wants to do cool sculpting and need to rule out umbilical hernia. Pt states she had on as a toddler but never had surgery. Unsure if she still has one. No bulges.  Last pap 10/20  Past Medical History:  Diagnosis Date  . Anxiety 06/10/2015  . Bronchitis   . Bronchitis   . Depressed mood 07/01/2015  . 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 Keloid 02/11/2015  . Seasonal allergies 04/09/2015    Past Surgical History:  Procedure Laterality Date  . EXTERNAL EAR SURGERY Right   . gallbalder      Family History  Problem Relation Age of Onset  . Lung cancer Maternal Grandmother   . Breast cancer Paternal Grandmother        late years    Social History   Socioeconomic History  . Marital status: Single    Spouse name: Not on file  . Number of children: 1  . Years of education: 72  . Highest education level: High school graduate  Occupational History  . Not on file  Tobacco Use  . Smoking status: Former Smoker    Types: Cigars    Quit date: 08/25/2016    Years since quitting: 4.2  . Smokeless tobacco: Former Clinical biochemist  . Vaping Use: Never used  Substance and Sexual Activity  . Alcohol use: No    Alcohol/week: 0.0 standard drinks  . Drug use: No  . Sexual activity: Yes    Partners: Male    Birth control/protection: None  Other Topics Concern  . Not on file  Social History  Narrative   Lives with mom , sister 15yrs, brother 13 yrs.  Other sister 23.  Was living in [place] for 3 years and now back in [place].  At [employer] in 10th grade.   Social Determinants of Health   Financial Resource Strain: Not on file  Food Insecurity: Not on file  Transportation Needs: Not on file  Physical Activity: Not on file  Stress: Not on file  Social Connections: Not on file  Intimate Partner Violence: Not on file    Outpatient Medications Prior to Visit  Medication Sig Dispense Refill  . acetaminophen (TYLENOL) 500 MG tablet Take 500 mg by mouth as needed for headache.  (Patient not taking: Reported on 07/25/2020)    . Blood Pressure Monitor DEVI Please check blood pressure 1-2 per week (Patient not taking: Reported on 07/25/2020) 1 each 0  . Blood Pressure Monitoring (BLOOD PRESSURE CUFF) MISC 1 Device by Does not apply route once a week. (Patient not taking: Reported on 07/25/2020) 1 each 0  . docusate sodium (COLACE) 100 MG capsule Take 1 capsule (100 mg total) by mouth 2 (two) times daily as needed. (Patient not taking: Reported on 07/25/2020) 30 capsule 2  .  ferrous fumarate (HEMOCYTE - 106 MG FE) 325 (106 Fe) MG TABS tablet Take 1 tablet (106 mg of iron total) by mouth every other day. (Patient not taking: Reported on 07/25/2020) 30 tablet 2  . ibuprofen (ADVIL) 800 MG tablet Take 1 tablet (800 mg total) by mouth every 8 (eight) hours as needed. (Patient not taking: Reported on 07/25/2020) 30 tablet 5  . Prenat-FeCbn-FeAsp-Meth-FA-DHA (PRENATE MINI) 18-0.6-0.4-350 MG CAPS Take 1 capsule by mouth daily. (Patient not taking: Reported on 07/25/2020) 30 capsule 11   No facility-administered medications prior to visit.      ROS:  Review of Systems  Constitutional: Negative for fever, malaise/fatigue and weight loss.  Gastrointestinal: Negative for blood in stool, constipation, diarrhea, nausea and vomiting.  Genitourinary: Positive for vaginal discharge. Negative for  dyspareunia, dysuria, flank pain, frequency, hematuria, urgency, vaginal bleeding and vaginal pain.  Musculoskeletal: Negative for back pain.  Skin: Negative for itching and rash.   BREAST: No symptoms   OBJECTIVE:   Vitals:  BP 110/80   Ht 5\' 7"  (1.702 m)   Wt 187 lb (84.8 kg)   LMP 12/04/2020 (Approximate)   Breastfeeding Yes   BMI 29.29 kg/m   Physical Exam Vitals reviewed.  Constitutional:      Appearance: She is well-developed.  Pulmonary:     Effort: Pulmonary effort is normal.  Abdominal:     Tenderness: There is no abdominal tenderness. There is no guarding or rebound.     Hernia: A hernia is present. Hernia is present in the umbilical area.    Genitourinary:    General: Normal vulva.     Pubic Area: No rash.      Labia:        Right: No rash, tenderness or lesion.        Left: No rash, tenderness or lesion.      Vagina: Normal. No vaginal discharge, erythema or tenderness.     Cervix: Normal.     Uterus: Normal. Not enlarged and not tender.      Adnexa: Right adnexa normal and left adnexa normal.       Right: No mass or tenderness.         Left: No mass or tenderness.    Musculoskeletal:        General: Normal range of motion.     Cervical back: Normal range of motion.  Skin:    General: Skin is warm and dry.  Neurological:     General: No focal deficit present.     Mental Status: She is alert and oriented to person, place, and time.  Psychiatric:        Mood and Affect: Mood normal.        Behavior: Behavior normal.        Thought Content: Thought content normal.        Judgment: Judgment normal.     Results: Results for orders placed or performed in visit on 12/11/20 (from the past 24 hour(s))  POCT Wet Prep with KOH     Status: Abnormal   Collection Time: 12/11/20 11:01 AM  Result Value Ref Range   Trichomonas, UA Negative    Clue Cells Wet Prep HPF POC pos    Epithelial Wet Prep HPF POC     Yeast Wet Prep HPF POC neg    Bacteria Wet Prep  HPF POC     RBC Wet Prep HPF POC     WBC Wet Prep HPF POC  KOH Prep POC Positive (A) Negative     Assessment/Plan: Bacterial vaginosis - Plan: POCT Wet Prep with KOH, metroNIDAZOLE (FLAGYL) 500 MG tablet; pos sx and wet prep. Rx flagyl, no EtOH. Will RF if sx recur. F/u prn. Dove sens skin soap/condoms.   Vaginal discharge--pos BV/rule out STDs.  Screening for STD (sexually transmitted disease) - Plan: HIV Antibody (routine testing w rflx), RPR, POCT Wet Prep with KOH, Cervicovaginal ancillary only  Umbilical hernia without obstruction and without gangrene--question hernia or not. Offered abd u/s to eval further; pt declines for now. Pt to f/u prn.    Meds ordered this encounter  Medications  . metroNIDAZOLE (FLAGYL) 500 MG tablet    Sig: Take 1 tablet (500 mg total) by mouth 2 (two) times daily for 7 days.    Dispense:  14 tablet    Refill:  0    Order Specific Question:   Supervising Provider    Answer:   Nadara Mustard [[id number]]      Return if symptoms worsen or fail to improve.  Noam Franzen B. [place] Warriner, PA-C 12/11/2020 11:03 AM

## 2020-12-12 LAB — CERVICOVAGINAL ANCILLARY ONLY
Chlamydia: NEGATIVE
Comment: NEGATIVE
Comment: NEGATIVE
Comment: NORMAL
Neisseria Gonorrhea: NEGATIVE
Trichomonas: NEGATIVE

## 2020-12-12 LAB — RPR: RPR Ser Ql: NONREACTIVE

## 2020-12-12 LAB — HIV ANTIBODY (ROUTINE TESTING W REFLEX): HIV Screen 4th Generation wRfx: NONREACTIVE

## 2021-01-30 ENCOUNTER — Telehealth: Payer: Self-pay

## 2021-01-30 DIAGNOSIS — K469 Unspecified abdominal hernia without obstruction or gangrene: Secondary | ICD-10-CM | POA: Insufficient documentation

## 2021-01-30 DIAGNOSIS — K429 Umbilical hernia without obstruction or gangrene: Secondary | ICD-10-CM

## 2021-01-30 NOTE — Telephone Encounter (Signed)
Pt calling; is getting Cold Sculpting done at [facility]; they need a letter of medical clearance since pt has small hernia to the left of the belly button.  [phone]

## 2021-01-30 NOTE — Telephone Encounter (Signed)
Pt calling; at last visit she asked provider to feel abd for hernia; was told there is a small one to the left of her belly button.  Pt requests this be added to her chart.  [phone]  Added to problem list.

## 2021-01-31 NOTE — Telephone Encounter (Signed)
Yes, if she'll get it. Declined at appt.

## 2021-01-31 NOTE — Telephone Encounter (Signed)
Discussed this with pt at 2/22 appt. She needs abd u/s to look for hernia since not obvious on exam. I can't give clearnace if I don't know.

## 2021-02-03 NOTE — Telephone Encounter (Signed)
Pt aware.  States she will go.  Prefers a Wed or Thurs any time.  Thanks.

## 2021-02-03 NOTE — Telephone Encounter (Signed)
Order placed. Pt to be contacted re: appt.

## 2021-03-14 ENCOUNTER — Ambulatory Visit: Admission: RE | Admit: 2021-03-14 | Payer: Medicaid Other | Source: Ambulatory Visit

## 2021-06-18 IMAGING — US OBSTETRIC <14 WK US AND TRANSVAGINAL OB US
1 series · 15 of 28 positions shown · non-contrast
Comparison: None.

CLINICAL DATA: 22 y/o  F; bleeding and cramping.

EXAM:
OBSTETRIC <14 WK US AND TRANSVAGINAL OB US
TECHNIQUE: Both transabdominal and transvaginal ultrasound examinations were
performed for complete evaluation of the gestation as well as the
maternal uterus, adnexal regions, and pelvic cul-de-sac.
Transvaginal technique was performed to assess early pregnancy.

[Series 1: obstetric <14 wk us and transvaginal ob us · 15 of 32 slices shown]
[im 1/32]
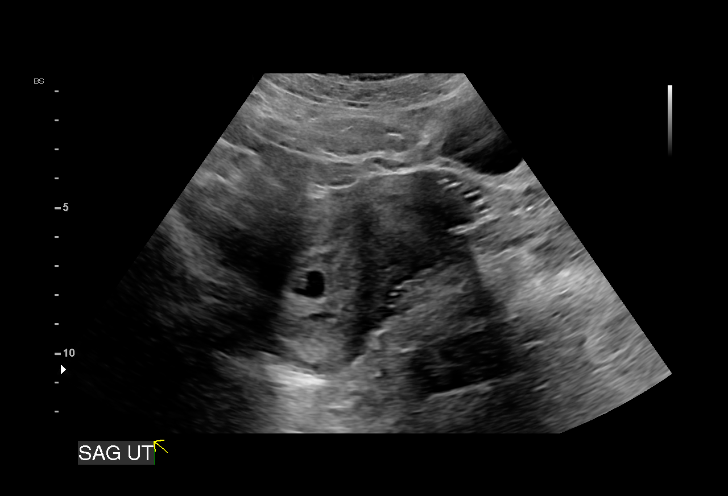
[im 3/32]
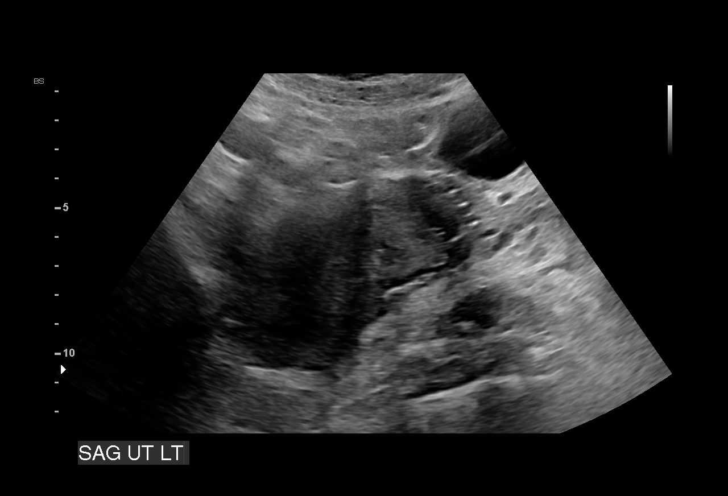
[im 5/32]
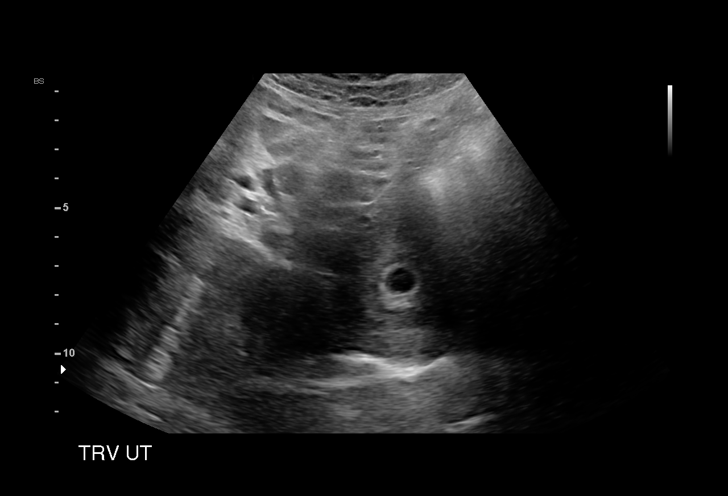
[im 7/32]
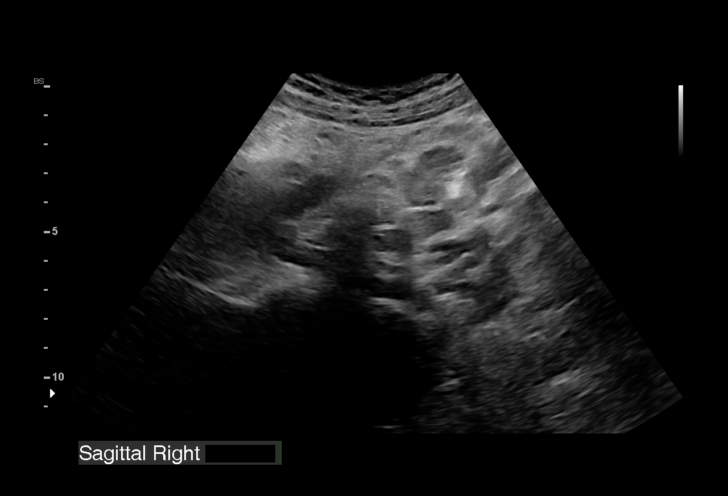
[im 10/32]
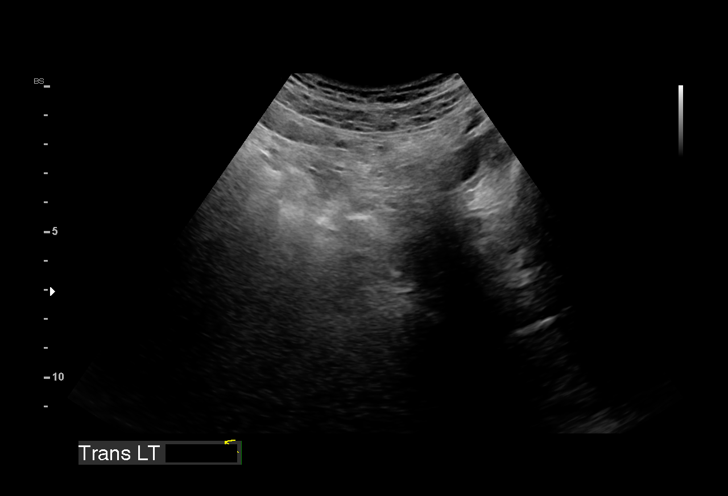
[im 12/32]
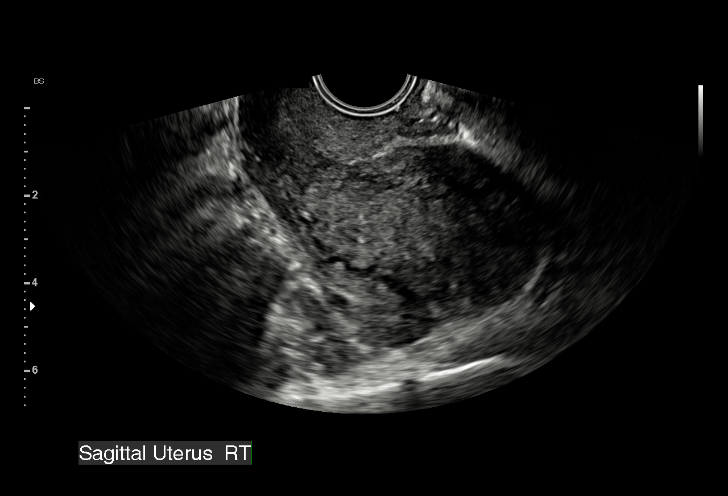
[im 14/32]
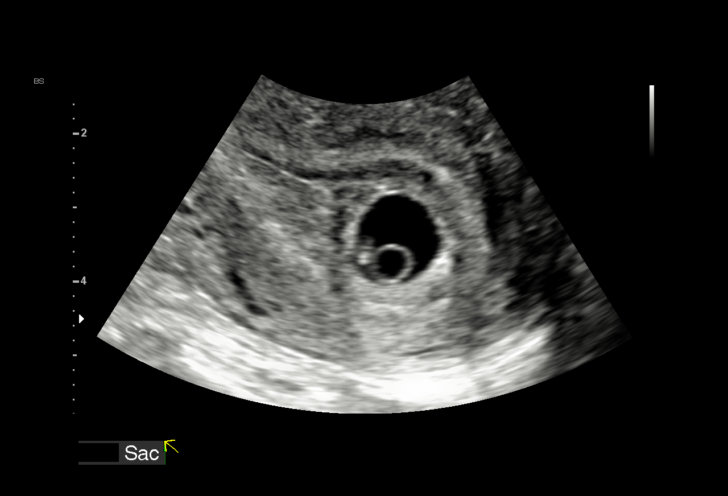
[im 17/32]
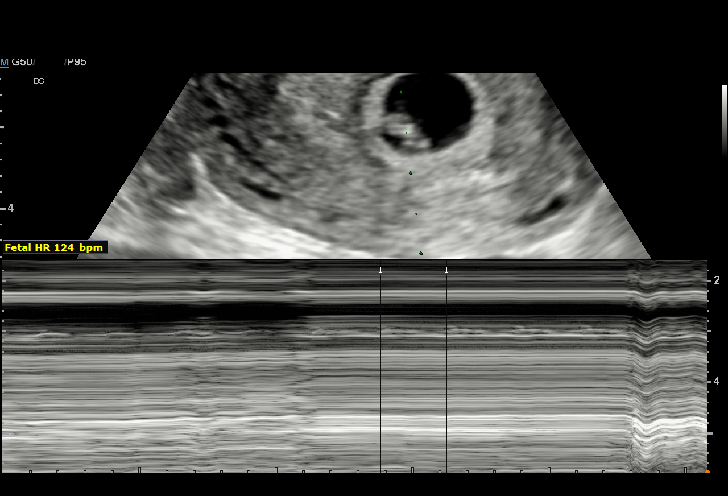
[im 18/32]
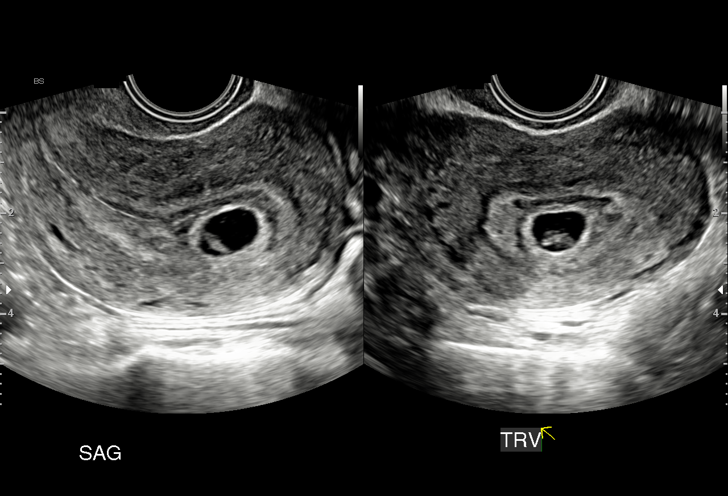
[im 20/32]
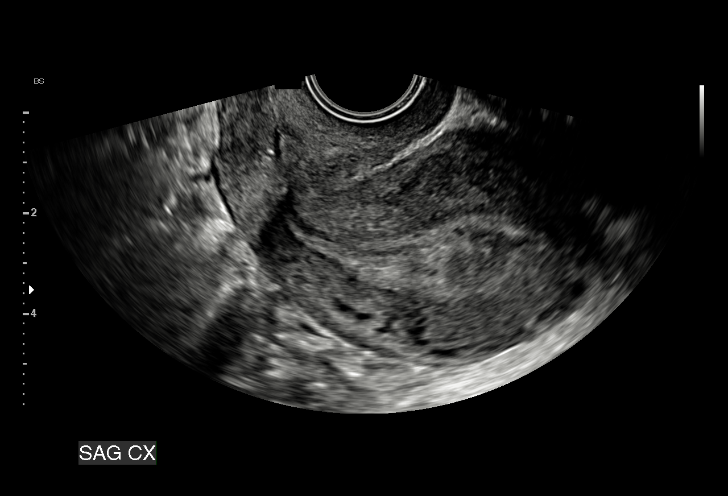
[im 22/32]
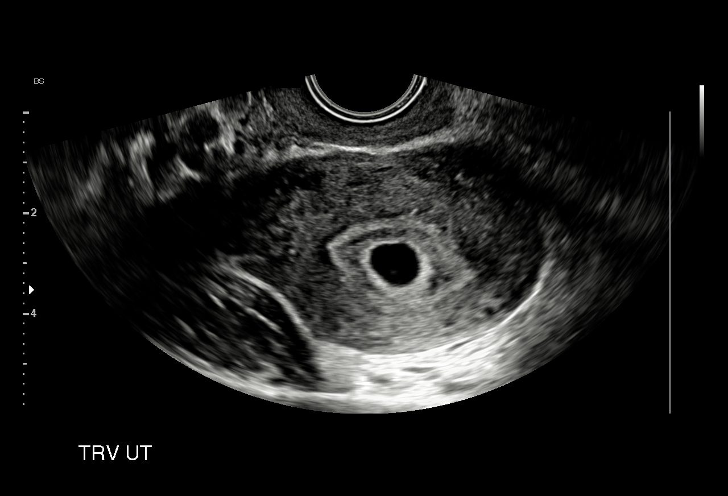
[im 25/32]
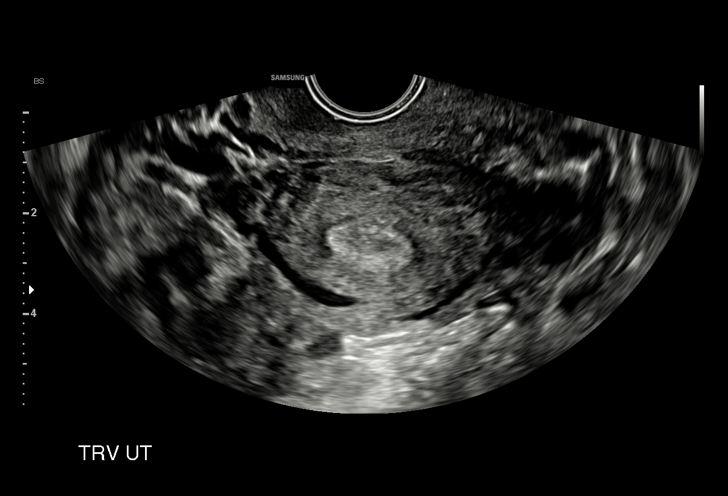
[im 27/32]
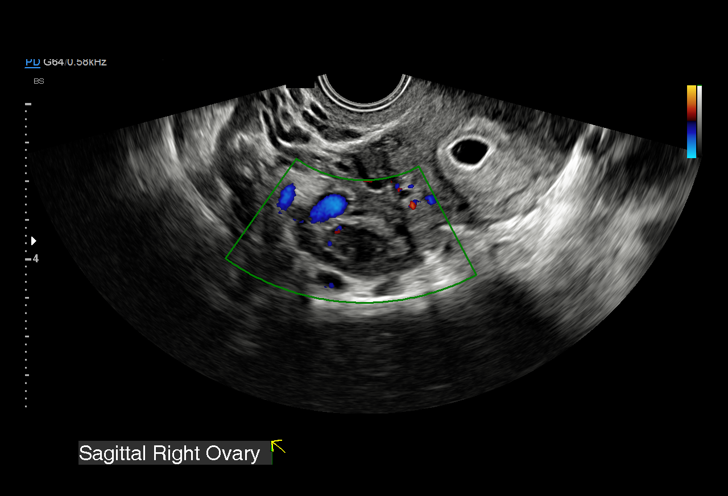
[im 29/32]
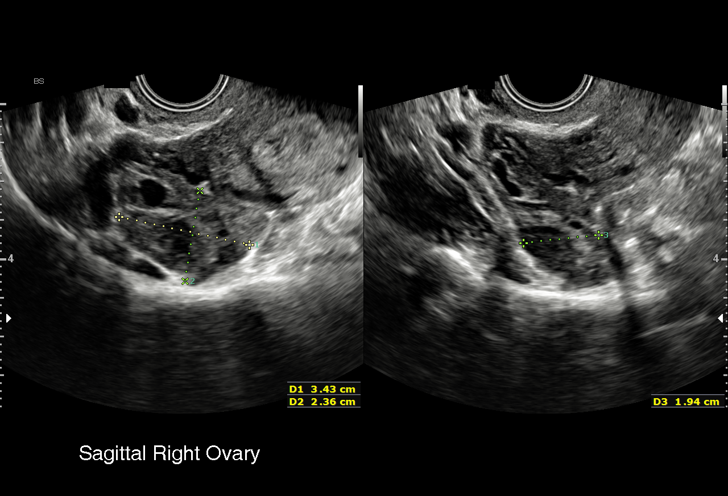
[im 32/32]
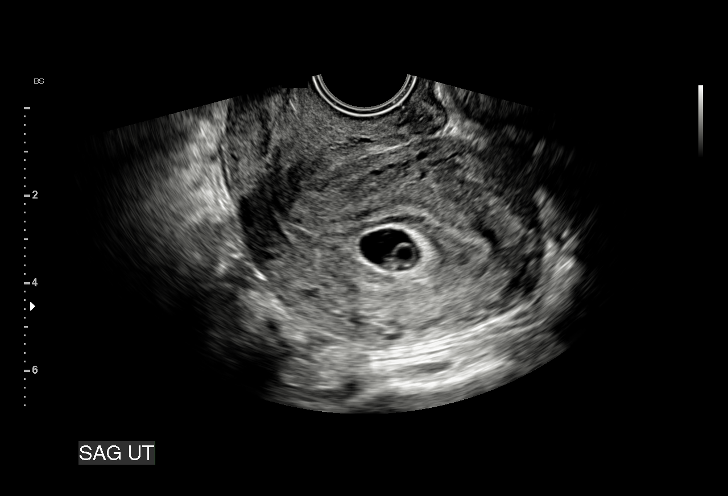

[15 of 28 positions shown; findings below may reference images not displayed]

FINDINGS: Intrauterine gestational sac: Single

Yolk sac:  Visualized.

Embryo:  Visualized.

Cardiac Activity: Visualized.

Heart Rate: 124 bpm

CRL: 6.2 mm   6 w   3 d                  US EDC: 12/20/2019

Subchorionic hemorrhage:  None visualized.

Maternal uterus/adnexae: Normal appearance of adnexa and ovaries.
Right corpus luteum cyst. Retroverted uterus. No pelvic free fluid.
IMPRESSION: Single live intrauterine pregnancy. Estimated gestational age 6
weeks 3 days.

## 2022-02-15 IMAGING — US US MFM OB FOLLOW-UP
1 series · 13 of 28 positions shown · non-contrast
Comparison: none

[Series 1: us mfm ob follow-up · 13 of 55 slices shown]
[im 3/55]
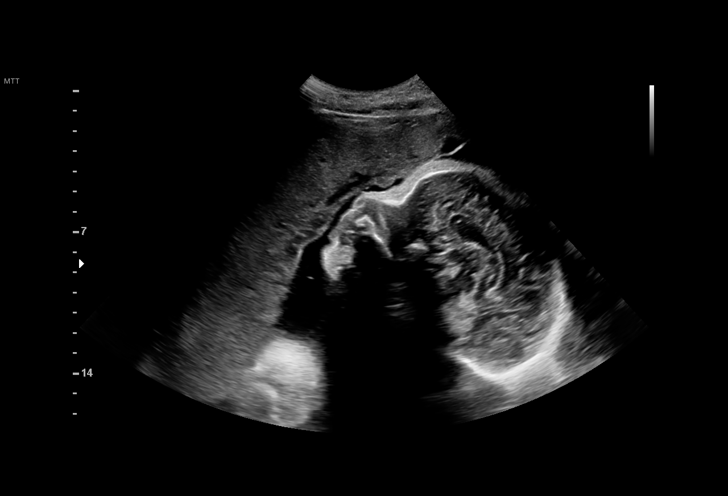
[im 7/55]
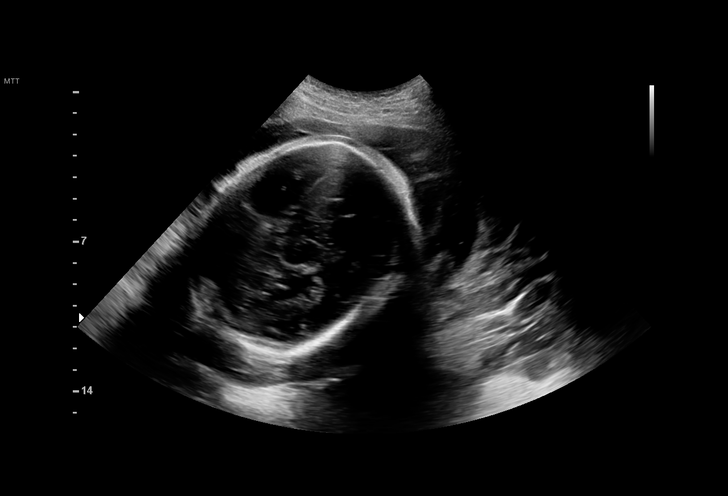
[im 11/55]
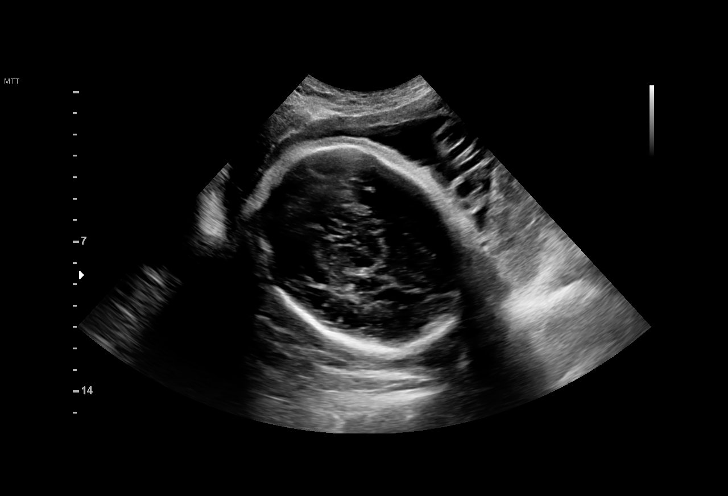
[im 15/55]
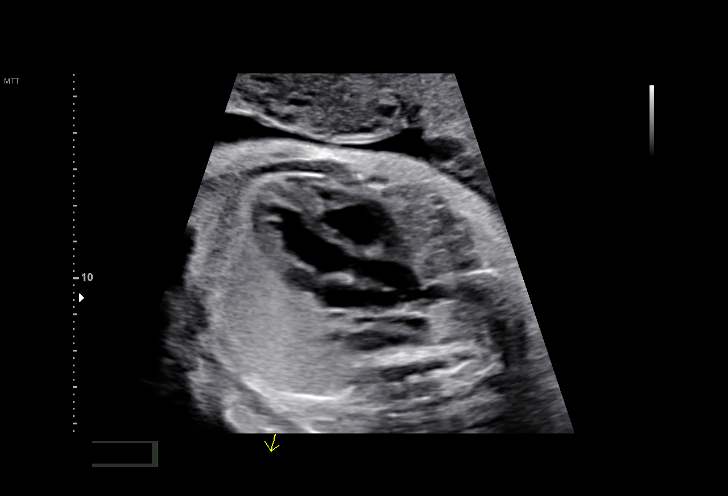
[im 19/55]
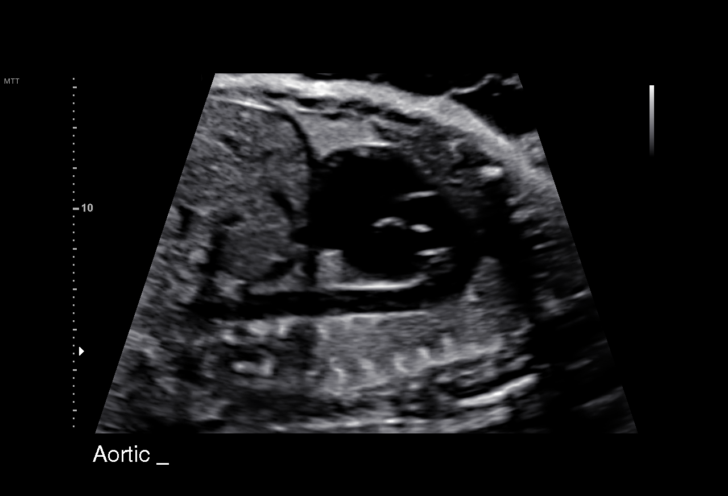
[im 23/55]
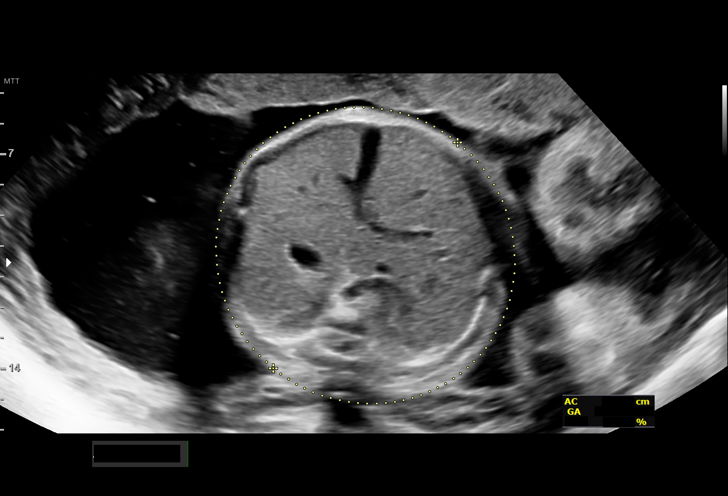
[im 29/55]
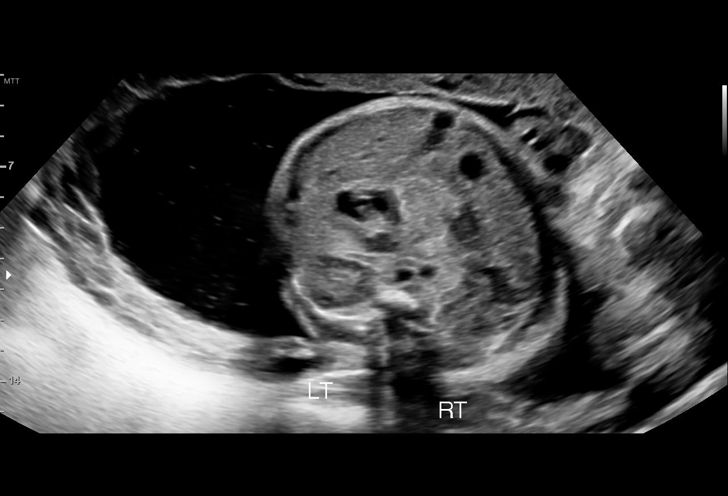
[im 33/55]
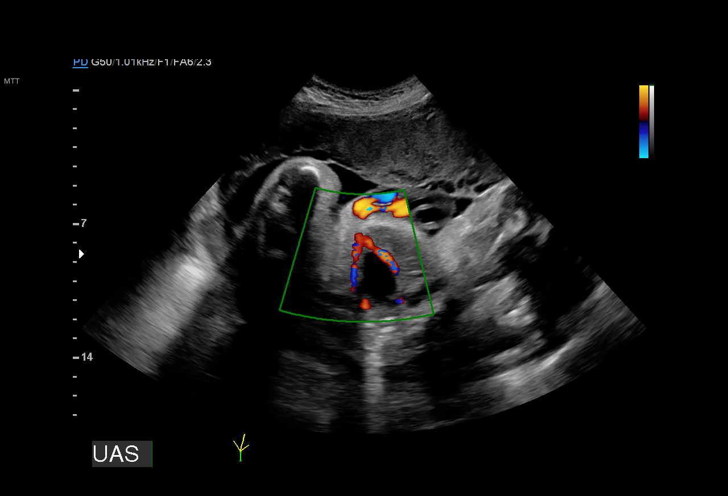
[im 37/55]
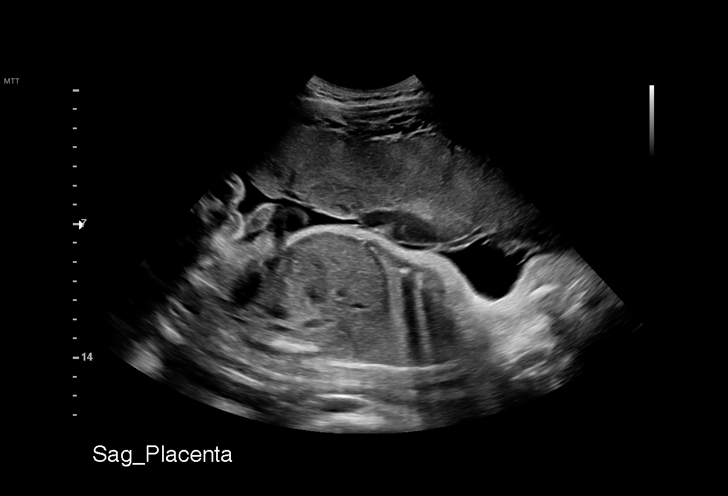
[im 41/55]
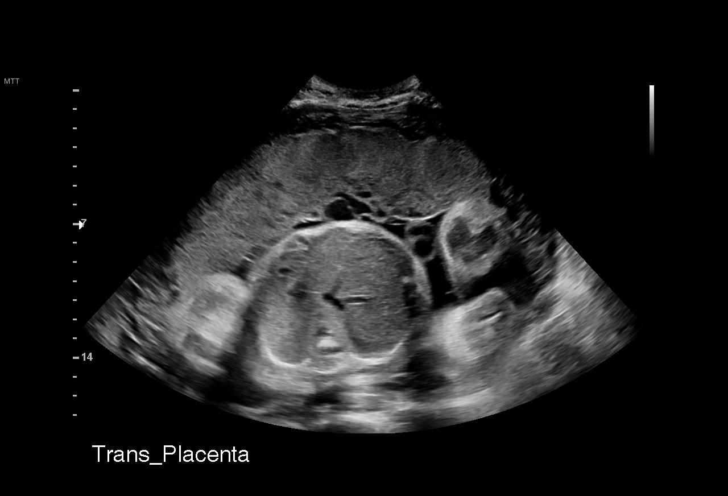
[im 45/55]
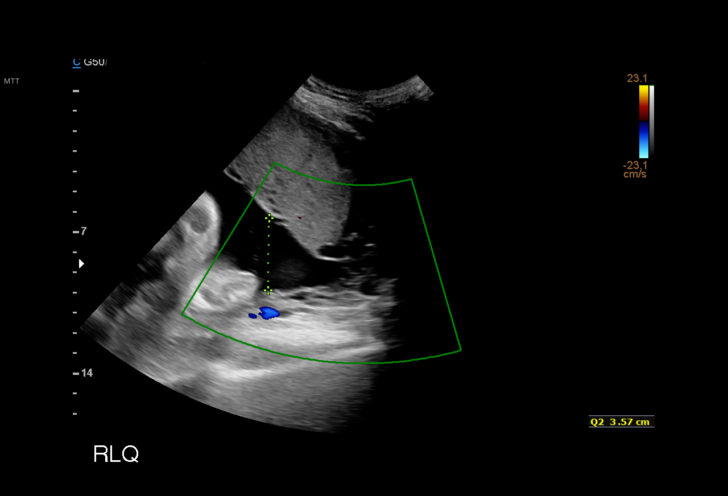
[im 49/55]
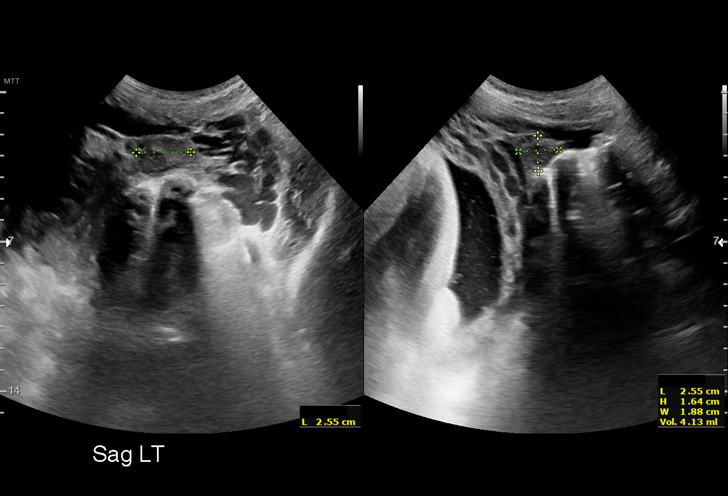
[im 53/55]
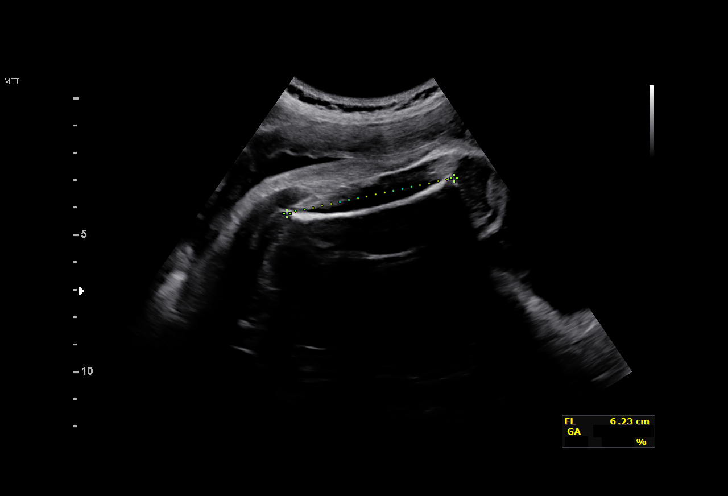

[13 of 28 positions shown; findings below may reference images not displayed]

----------------------------------------------------------------------

 ----------------------------------------------------------------------
Indications

  Late prenatal care, second trimester
  (Negative AFP)(Negative Horizon)
  32 weeks gestation of pregnancy
  Encounter for other antenatal screening
  follow-up
 ----------------------------------------------------------------------
Fetal Evaluation

 Num Of Fetuses:         1
 Cardiac Activity:       Observed
 Presentation:           Cephalic
 Placenta:               Anterior
 P. Cord Insertion:      Previously Visualized

 Amniotic Fluid
 AFI FV:      Within normal limits

 AFI Sum(cm)     %Tile       Largest Pocket(cm)
 22.26           86

 RUQ(cm)       RLQ(cm)       LUQ(cm)        LLQ(cm)

Biometry

 BPD:      82.4  mm     G. Age:  33w 1d         71  %    CI:        71.38   %    70 - 86
                                                         FL/HC:      19.8   %    19.1 -
 HC:      310.6  mm     G. Age:  34w 5d         80  %    HC/AC:      1.02        0.96 -
 AC:      304.9  mm     G. Age:  34w 3d         96  %    FL/BPD:     74.6   %    71 - 87
 FL:       61.5  mm     G. Age:  31w 6d         31  %    FL/AC:      20.2   %    20 - 24

 Est. FW:    3343  gm    4 lb 15 oz      85  %
OB History

 Gravidity:    3         Term:   1        Prem:   0        SAB:   1
 TOP:          0       Ectopic:  0        Living: 1
Gestational Age

 LMP:           32w 1d        Date:  05/16/19                 EDD:   02/20/20
 U/S Today:     33w 4d                                        EDD:   02/10/20
 Best:          32w 1d     Det. By:  LMP  (05/16/19)          EDD:   02/20/20
Anatomy

 Cranium:               Previously seen        LVOT:                   Previously seen
 Cavum:                 Previously seen        Aortic Arch:            Previously seen
 Ventricles:            Previously seen        Ductal Arch:            Previously seen
 Choroid Plexus:        Previously seen        Diaphragm:              Previously seen
 Cerebellum:            Previously seen        Stomach:                Previously Seen
 Posterior Fossa:       Previously seen        Abdomen:                Previously seen
 Nuchal Fold:           Not applicable (>20    Abdominal Wall:         Previously seen
                        wks GA)
 Face:                  Orbits and profile     Cord Vessels:           Appears normal (3
                        previously seen                                vessel cord)
 Lips:                  Previously seen        Kidneys:                Appear normal
 Palate:                Previously seen        Bladder:                Appears normal
 Thoracic:              Previously seen        Spine:                  Previously seen
 Heart:                 Appears normal         Upper Extremities:      Previously seen
                        (4CH, axis, and
                        situs)
 RVOT:                  Previously seen        Lower Extremities:      Previously seen

 Other:  Technically difficult due to fetal position.
Cervix Uterus Adnexa

 Cervix
 Not visualized (advanced GA >31wks)

 Uterus
 No abnormality visualized.

 Left Ovary
 Within normal limits. No adnexal mass visualized.

 Right Ovary
 Within normal limits. No adnexal mass visualized.

 Cul De Sac
 No free fluid seen.

 Adnexa
 No abnormality visualized.
Comments

 This patient was seen for a follow up growth scan as she
 presented late for prenatal care.  She denies any problems
 since her last exam.
 She was informed that the fetal growth and amniotic fluid
 level appears appropriate for her gestational age.
 Her EDC should be kept as February 20, 2020.
 Follow-up as indicated.

## 2022-03-30 ENCOUNTER — Encounter: Payer: Self-pay | Admitting: Obstetrics

## 2022-04-09 ENCOUNTER — Encounter: Payer: Medicaid Other | Admitting: Obstetrics

## 2022-05-04 ENCOUNTER — Encounter: Payer: Medicaid Other | Admitting: Obstetrics

## 2022-07-29 ENCOUNTER — Ambulatory Visit (INDEPENDENT_AMBULATORY_CARE_PROVIDER_SITE_OTHER): Payer: Self-pay | Admitting: Licensed Practical Nurse

## 2022-07-29 ENCOUNTER — Other Ambulatory Visit (HOSPITAL_COMMUNITY)
Admission: RE | Admit: 2022-07-29 | Discharge: 2022-07-29 | Disposition: A | Discharge status: Home / Self Care | Payer: Medicaid Other | Source: Ambulatory Visit | Attending: Licensed Practical Nurse | Admitting: Licensed Practical Nurse

## 2022-07-29 ENCOUNTER — Encounter: Payer: Self-pay | Admitting: Licensed Practical Nurse

## 2022-07-29 VITALS — BP 120/80 | Ht 67.0 in | Wt 172.0 lb

## 2022-07-29 DIAGNOSIS — Z113 Encounter for screening for infections with a predominantly sexual mode of transmission: Secondary | ICD-10-CM

## 2022-07-29 DIAGNOSIS — B3731 Acute candidiasis of vulva and vagina: Secondary | ICD-10-CM

## 2022-07-29 MED ORDER — TERCONAZOLE 0.4 % VA CREA: 1.0000 | TOPICAL_CREAM | Freq: Every day | VAGINAL | 0 refills | Status: DC

## 2022-07-29 NOTE — Progress Notes (Signed)
Gynecology STD Evaluation   Chief Complaint:  Chief Complaint  Patient presents with   Vaginitis    History of Present Illness: Patient is a 26 y.o. [id number] presents for STD testing.  The patient has not noted intermenstrual spotting,  has not experienced postcoital bleeding, and does not report increased vaginal discharge.  There is not a history of prior sexually transmitted infection(s).  But she does frequently get BV and yeast infections  Kaitlin Montgomery has had intense vaginal itching x 1 week, she tried Diflucan x 1 a few days ago, it did not help.  She was seen at [facility] a a few days ago and was told that she does not have BV. It is not clear if she was told that she has yeasts, she did speak with someone prior to taking the Diflucan. In the past Diflucan hsa resolved her symptoms. Kaitlin Montgomery denies any vaginal odor or discharge.  She did have sex about a week ago, during that time they used a condom that was dry so the IC was uncomfortable.  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 She does wear tight clothing.   The patient is sexually active and reports 61 female partners in the last 3 months . She currently uses Condoms for contraception. yes, the patient also relies on condoms to prevent the spread of sexually transmitted infections.    PMHx: She  has a past medical history of Anxiety (06/10/2015), Bronchitis, Bronchitis, Depressed mood (07/01/2015), Exercise-induced asthma (04/09/2015), Keloid (02/11/2015), and Seasonal allergies (04/09/2015). Also,  has a past surgical history that includes External ear surgery (Right) and gallbalder., family history includes Breast cancer in her paternal grandmother; Lung cancer in her maternal grandmother.,  reports that she quit smoking about 5 years ago. Her smoking use included cigars. She has quit using smokeless tobacco. She reports that she does not drink alcohol and does  not use drugs.  She has a current medication list which includes the following prescription(s): terconazole. Also, has No Known Allergies.  Review of Systems  Constitutional: Negative.   Respiratory: Negative.    Cardiovascular: Negative.   Gastrointestinal: Negative.   Genitourinary:        Vaginal itching   Skin:  Positive for itching.  Neurological: Negative.   Psychiatric/Behavioral: Negative.      Objective: BP 120/80   Ht 5\' 7"  (1.702 m)   Wt 172 lb (78 kg)   LMP 07/26/2022   BMI 26.94 kg/m    Assessment: 26 y.o. [id number] No problem-specific Assessment & Plan notes found for this encounter.   Plan: Problem List Items Addressed This Visit   None Visit Diagnoses     Yeast vaginitis    -  Primary   Relevant Medications   terconazole (TERAZOL 7) 0.4 % vaginal cream   Screen for STD (sexually transmitted disease)       Relevant Orders   Cervicovaginal ancillary only   HEP, RPR, HIV Panel   Acute vaginitis            1) Contraception - Education given regarding options for contraception, including IUD placement.   2) STI screening was offered collected   3) STD prevention and family planning choices, will call with any abnormal results  4) reviewed vaginal hygiene  5) reminded to schedule annual exam   Kaitlin Montgomery, [facility]  07/29/22  2:04  PM

## 2022-07-30 LAB — HEP, RPR, HIV PANEL
HIV Screen 4th Generation wRfx: NONREACTIVE
Hepatitis B Surface Ag: NEGATIVE
RPR Ser Ql: NONREACTIVE

## 2022-07-31 LAB — CERVICOVAGINAL ANCILLARY ONLY
Bacterial Vaginitis (gardnerella): POSITIVE — AB
Candida Glabrata: NEGATIVE
Candida Vaginitis: POSITIVE — AB
Chlamydia: NEGATIVE
Comment: NEGATIVE
Comment: NEGATIVE
Comment: NEGATIVE
Comment: NEGATIVE
Comment: NEGATIVE
Comment: NORMAL
Neisseria Gonorrhea: NEGATIVE
Trichomonas: NEGATIVE

## 2022-08-03 ENCOUNTER — Telehealth: Payer: Self-pay | Admitting: Licensed Practical Nurse

## 2022-08-03 DIAGNOSIS — B9689 Other specified bacterial agents as the cause of diseases classified elsewhere: Secondary | ICD-10-CM

## 2022-08-03 DIAGNOSIS — B379 Candidiasis, unspecified: Secondary | ICD-10-CM

## 2022-08-03 MED ORDER — METRONIDAZOLE 500 MG PO TABS: 500.0000 mg | ORAL_TABLET | Freq: Two times a day (BID) | ORAL | 0 refills | Status: AC

## 2022-08-03 MED ORDER — FLUCONAZOLE 150 MG PO TABS: 150.0000 mg | ORAL_TABLET | Freq: Once | ORAL | 0 refills | Status: AC

## 2022-08-03 NOTE — Telephone Encounter (Signed)
Patient is callin to follow up on lab results. Patient is requesting medication sent [place]. Please advise?

## 2022-08-03 NOTE — Telephone Encounter (Signed)
PT AWARE RX SENT TO PHARMACY  FOR YEAST AND BV AND DO NOT DRINK ANY ALCOHOL WITH THE METRONIDAZOLE

## 2022-08-12 ENCOUNTER — Ambulatory Visit: Payer: Self-pay | Admitting: Licensed Practical Nurse

## 2022-08-17 ENCOUNTER — Ambulatory Visit
Admission: EM | Admit: 2022-08-17 | Discharge: 2022-08-17 | Disposition: A | Discharge status: Home / Self Care | Payer: Self-pay | Attending: Urgent Care | Admitting: Urgent Care

## 2022-08-17 DIAGNOSIS — J029 Acute pharyngitis, unspecified: Secondary | ICD-10-CM

## 2022-08-17 DIAGNOSIS — J039 Acute tonsillitis, unspecified: Secondary | ICD-10-CM

## 2022-08-17 LAB — POCT RAPID STREP A (OFFICE): Rapid Strep A Screen: NEGATIVE

## 2022-08-17 MED ORDER — PREDNISONE 10 MG PO TABS: 30.0000 mg | ORAL_TABLET | Freq: Every day | ORAL | 0 refills | Status: AC

## 2022-08-17 NOTE — ED Triage Notes (Signed)
Pt. Is c/o a sore throat that started 3 day ago. Pt. States her throat hurts mainly on the left side and is feeling pain on the outside of her throat as well.

## 2022-08-17 NOTE — ED Provider Notes (Signed)
Renaldo Fiddler    CSN: [id number] Arrival date & time: 08/17/22  0930      History   Chief Complaint Chief Complaint  Patient presents with   Sore Throat    HPI Kaitlin Montgomery is a 26 y.o. female.    Sore Throat   Presents to urgent care with complaint of sore throat starting 3 days ago.  She states the pain is mostly on the left side and feels pain in her neck as well.  Denies fever, myalgia.  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 Authorizing Provider  terconazole (TERAZOL 7) 0.4 % vaginal cream Place 1 applicator vaginally at bedtime. Use for seven days 07/29/22   Dominic, Courtney Heys, CNM    Family History Family History  Problem Relation Age of Onset   Lung cancer Maternal Grandmother    Breast cancer Paternal Grandmother        late years    Social History Social History   Tobacco Use   Smoking status: Former    Types: Cigars    Quit date: 08/25/2016    Years since quitting: 5.9   Smokeless tobacco: Former  Building services engineer Use: Never used  Substance Use Topics   Alcohol use: No    Alcohol/week: 0.0 standard drinks of  alcohol   Drug use: No     Allergies   Patient has no known allergies.   Review of Systems Review of Systems   Physical Exam Triage Vital Signs ED Triage Vitals  Enc Vitals Group     BP 08/17/22 1001 93/66     Pulse Rate 08/17/22 1001 (!) 119     Resp 08/17/22 1001 16     Temp 08/17/22 1001 99.6 F (37.6 C)     Temp Source 08/17/22 1001 Oral     SpO2 08/17/22 1001 96 %     Weight --      Height --      Head Circumference --      Peak Flow --      Pain Score 08/17/22 1004 9     Pain Loc --      Pain Edu? --      Excl. in [place]? --    No data found.  Updated Vital Signs BP 93/66 (BP Location: Left Arm)   Pulse Marland Kitchen)  119   Temp 99.6 F (37.6 C) (Oral)   Resp 16   LMP  (LMP Unknown)   SpO2 96%   Visual Acuity Right Eye Distance:   Left Eye Distance:   Bilateral Distance:    Right Eye Near:   Left Eye Near:    Bilateral Near:     Physical Exam HENT:     Mouth/Throat:     Mouth: Mucous membranes are moist.     Pharynx: Posterior oropharyngeal erythema present.     Tonsils: 3+ on the right. 3+ on the left.  Cardiovascular:     Rate and Rhythm: Tachycardia present.  Lymphadenopathy:     Cervical: Cervical adenopathy present.  Skin:    General: Skin is dry.  Neurological:     General: No focal deficit present.     Mental Status: She is alert and oriented to person, place, and time.  Psychiatric:        Mood and Affect: Mood normal.        Behavior: Behavior normal.      UC Treatments / Results  Labs (all labs ordered are listed, but only abnormal results are displayed) Labs Reviewed - No data to display  EKG   Radiology No results found.  Procedures Procedures (including critical care time)  Medications Ordered in UC Medications - No data to display  Initial Impression / Assessment and Plan / UC Course  I have reviewed the triage vital signs and the nursing notes.  Pertinent labs & imaging results that were available during my care of  the patient were reviewed by me and considered in my medical decision making (see chart for details).   Physical exam is remarkable for pharyngeal erythema and 3+ tonsils left > right..  There is no peritonsillar exudate or tonsillar abscesses noted.  Some cervical tenderness on the left side.  Rapid strep is negative.  Viral pharyngitis is suspected and provider recommended use of NSAIDs for pain management.  Also recommended warm salt water gargles.  Will prescribe short course of corticosteroid for relief of acute inflammation of her tonsils.   Final Clinical Impressions(s) / UC Diagnoses   Final diagnoses:  None   Discharge Instructions   None    ED Prescriptions   None    PDMP not reviewed this encounter.   Rose Phi,  08/17/22 [phone]

## 2022-08-17 NOTE — Discharge Instructions (Addendum)
Follow up here or with your primary care provider if your symptoms are worsening or not improving with treatment.     

## 2022-08-18 ENCOUNTER — Ambulatory Visit (INDEPENDENT_AMBULATORY_CARE_PROVIDER_SITE_OTHER): Payer: Self-pay

## 2022-08-18 ENCOUNTER — Ambulatory Visit
Admission: EM | Admit: 2022-08-18 | Discharge: 2022-08-18 | Disposition: A | Discharge status: Home / Self Care | Payer: Self-pay

## 2022-08-18 DIAGNOSIS — M79641 Pain in right hand: Secondary | ICD-10-CM

## 2022-08-18 NOTE — Discharge Instructions (Addendum)
There is no evidence of any acute fracture in your hand.  As we discussed, I recommend you seek evaluation at an orthopedic or hand specialist, possibly the same 1 who treated your carpal tunnel and de Quervain's synovitis previously.  Since the dose of prednisone preceded the onset of your current symptoms, I recommend you stop taking prednisone.

## 2022-08-18 NOTE — ED Provider Notes (Signed)
Roderic Palau    CSN: [id number] Arrival date & time: 08/18/22  1022      History   Chief Complaint Chief Complaint  Patient presents with   Hand Pain    HPI Esthefani Bastien is a 26 y.o. female.    Hand Pain   Presents to urgent care with complaint of acute right hand pain mostly affecting her thumb.  She states that it feels like her hand is "broken".  She denies any injury or precipitating event.  Patient was seen in urgent care yesterday for acute sore throat and discharged with a course of corticosteroid for relief of tonsillitis.  She states she took 1 dose of the medication and reports improved throat symptoms, however right hand pain started.  Patient endorses history of bilateral carpal tunnel with steroid injections.  Also endorses history of left de Quervain's synovitis treated with steroid injection.    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 Authorizing Provider  fluconazole (DIFLUCAN) 150 MG tablet Take 150 mg by mouth once. 08/03/22   [provider]  predniSONE (DELTASONE) 10 MG tablet Take 3 tablets (30 mg total) by mouth daily  with breakfast for 3 days. 08/17/22 08/20/22  Kallin Henk, Annie Main, FNP  terconazole (TERAZOL 7) 0.4 % vaginal cream Place 1 applicator vaginally at bedtime. Use for seven days 07/29/22   Dominic, Nunzio Cobbs, CNM    Family History Family History  Problem Relation Age of Onset   Lung cancer Maternal Grandmother    Breast cancer Paternal Grandmother        late years    Social History Social History   Tobacco Use   Smoking status: Former    Types: Cigars    Quit date: 08/25/2016    Years since quitting: 5.9   Smokeless tobacco: Former  Scientific laboratory technician Use: Never used  Substance Use Topics   Alcohol use: No    Alcohol/week: 0.0 standard drinks of alcohol   Drug use: No     Allergies   Patient has no known allergies.   Review of Systems Review of Systems   Physical Exam Triage Vital Signs ED Triage Vitals [08/18/22 1156]  Enc Vitals Group     BP 111/73     Pulse Rate 91     Resp 16     Temp 99.3  F (37.4 C)     Temp Source Oral     SpO2 98 %     Weight      Height      Head Circumference      Peak Flow      Pain Score 10     Pain Loc      Pain Edu?      Excl. in [place]?    No data found.  Updated Vital Signs BP 111/73 (BP Location: Left Arm)   Pulse 91   Temp 99.3 F (37.4 C) (Oral)   Resp 16   LMP  (LMP Unknown)   SpO2 98%   Visual Acuity Right Eye Distance:   Left Eye Distance:   Bilateral Distance:    Right Eye Near:   Left Eye Near:    Bilateral Near:     Physical Exam Vitals reviewed.  Constitutional:      Appearance: Normal appearance.  Musculoskeletal:     Comments: Movement of digits of right hand limited by pain, especially her thumb.  Pain with wrist motion in her thumb.  Tingling in her thumb.  Skin:    General: Skin is warm and dry.  Neurological:     General: No focal deficit present.     Mental Status: She is alert and oriented to person, place, and time.  Psychiatric:        Mood and Affect: Mood normal.         Behavior: Behavior normal.     UC Treatments / Results  Labs (all labs ordered are listed, but only abnormal results are displayed) Labs Reviewed - No data to display  EKG   Radiology No results found.  Procedures Procedures (including critical care time)  Medications Ordered in UC Medications - No data to display  Initial Impression / Assessment and Plan / UC Course  I have reviewed the triage vital signs and the nursing notes.  Pertinent labs & imaging results that were available during my care of the patient were reviewed by me and considered in my medical decision making (see chart for details).   Symptoms seem to be first second and third digits so likely radial nerve related.  They are unlikely to be related to the single dose of prednisone ordered yesterday.  However, given she states her throat symptoms were improved, I asked her to stop taking the prednisone in the event this was related to her new symptom.  She again states that her hand feels "broken" and requests imaging which reveals no acute fracture.  Encouraged the patient to be evaluated at an orthopedic or hand specialist.  She states she was seen by a hand specialist in [facility] for her other reported issues.  Final Clinical Impressions(s) / UC Diagnoses   Final diagnoses:  Pain of right hand   Discharge Instructions   None    ED Prescriptions   None    PDMP not reviewed this encounter.   Rose Phi, [place] 08/18/22 1254

## 2022-08-18 NOTE — ED Triage Notes (Signed)
Pt. Was treated @ this urgent care yesterday.Pt. states after taking her prescribed medication yesterday she started having right hand pain. Pt. States it feels like her hand is "broken"

## 2022-09-24 ENCOUNTER — Other Ambulatory Visit: Payer: Self-pay

## 2022-09-24 ENCOUNTER — Telehealth: Payer: Self-pay

## 2022-09-24 DIAGNOSIS — B9689 Other specified bacterial agents as the cause of diseases classified elsewhere: Secondary | ICD-10-CM

## 2022-09-24 DIAGNOSIS — N760 Acute vaginitis: Secondary | ICD-10-CM

## 2022-09-24 DIAGNOSIS — B379 Candidiasis, unspecified: Secondary | ICD-10-CM

## 2022-09-24 MED ORDER — FLUCONAZOLE 150 MG PO TABS: 150.0000 mg | ORAL_TABLET | Freq: Once | ORAL | 0 refills | Status: AC

## 2022-09-24 MED ORDER — METRONIDAZOLE 500 MG PO TABS: 500.0000 mg | ORAL_TABLET | Freq: Two times a day (BID) | ORAL | 0 refills | Status: DC

## 2022-10-21 ENCOUNTER — Ambulatory Visit (INDEPENDENT_AMBULATORY_CARE_PROVIDER_SITE_OTHER): Payer: Medicaid Other

## 2022-10-21 ENCOUNTER — Telehealth: Payer: Self-pay

## 2022-10-21 ENCOUNTER — Other Ambulatory Visit (HOSPITAL_COMMUNITY)
Admission: RE | Admit: 2022-10-21 | Discharge: 2022-10-21 | Disposition: A | Discharge status: Home / Self Care | Payer: Medicaid Other | Source: Ambulatory Visit | Attending: Licensed Practical Nurse | Admitting: Licensed Practical Nurse

## 2022-10-21 VITALS — BP 100/60 | Ht 67.0 in | Wt 170.0 lb

## 2022-10-21 DIAGNOSIS — N898 Other specified noninflammatory disorders of vagina: Secondary | ICD-10-CM

## 2022-10-21 NOTE — Progress Notes (Signed)
Patient self swabbed today due to ongoing vaginal itching, internal and external. Has some vaginal discharge and odor, not fishy or sour. Denies pelvic pain, vaginal spotting/bleeding. She was last seen in September, positive BV and yeast, says Rx's did not clear symptoms. Pt is interested in blood work. Message sent to provider.

## 2022-10-21 NOTE — Telephone Encounter (Signed)
Pt self swabbed today for STD testing, BV and yeast. She would like to know if she can get a full blood panel too for infections?

## 2022-10-22 ENCOUNTER — Other Ambulatory Visit: Payer: Self-pay | Admitting: Licensed Practical Nurse

## 2022-10-22 DIAGNOSIS — Z113 Encounter for screening for infections with a predominantly sexual mode of transmission: Secondary | ICD-10-CM

## 2022-10-22 LAB — CERVICOVAGINAL ANCILLARY ONLY
Bacterial Vaginitis (gardnerella): POSITIVE — AB
Candida Glabrata: NEGATIVE
Candida Vaginitis: POSITIVE — AB
Chlamydia: NEGATIVE
Comment: NEGATIVE
Comment: NEGATIVE
Comment: NEGATIVE
Comment: NEGATIVE
Comment: NEGATIVE
Comment: NORMAL
Neisseria Gonorrhea: NEGATIVE
Trichomonas: NEGATIVE

## 2022-10-22 NOTE — Progress Notes (Signed)
Pt recently seen for self swab for BV.  Now ask for blood work for STD, orders placed Carie Caddy, Ina Homes  [facility]  10/22/22  5:20 PM

## 2022-10-22 NOTE — Addendum Note (Signed)
Addended by: Carie Caddy on: 10/22/2022 05:21 PM   Modules accepted: Orders

## 2022-10-23 ENCOUNTER — Other Ambulatory Visit: Payer: Self-pay | Admitting: Licensed Practical Nurse

## 2022-10-23 ENCOUNTER — Encounter: Payer: Self-pay | Admitting: Licensed Practical Nurse

## 2022-10-23 DIAGNOSIS — B9689 Other specified bacterial agents as the cause of diseases classified elsewhere: Secondary | ICD-10-CM

## 2022-10-23 MED ORDER — METRONIDAZOLE 500 MG PO TABS: 500.0000 mg | ORAL_TABLET | Freq: Two times a day (BID) | ORAL | 0 refills | Status: DC

## 2022-10-23 NOTE — Progress Notes (Signed)
Pt self swabbed, swab shows both BV and yeast, script for Flagyl sent to pharmacy.  Attempted to call pt, mailbox full unable  to LVM.  Mychart message sent Jannifer Hick  [facility]  10/23/22  5:08 PM

## 2022-11-21 ENCOUNTER — Emergency Department (HOSPITAL_BASED_OUTPATIENT_CLINIC_OR_DEPARTMENT_OTHER)
Admission: EM | Admit: 2022-11-21 | Discharge: 2022-11-21 | Disposition: A | Discharge status: Home / Self Care | Payer: Medicaid Other | Attending: Emergency Medicine | Admitting: Emergency Medicine

## 2022-11-21 ENCOUNTER — Encounter (HOSPITAL_BASED_OUTPATIENT_CLINIC_OR_DEPARTMENT_OTHER): Payer: Self-pay | Admitting: Emergency Medicine

## 2022-11-21 ENCOUNTER — Emergency Department (HOSPITAL_BASED_OUTPATIENT_CLINIC_OR_DEPARTMENT_OTHER): Payer: Medicaid Other

## 2022-11-21 ENCOUNTER — Other Ambulatory Visit: Payer: Self-pay

## 2022-11-21 DIAGNOSIS — S161XXA Strain of muscle, fascia and tendon at neck level, initial encounter: Secondary | ICD-10-CM | POA: Diagnosis not present

## 2022-11-21 DIAGNOSIS — S39012A Strain of muscle, fascia and tendon of lower back, initial encounter: Secondary | ICD-10-CM | POA: Diagnosis not present

## 2022-11-21 DIAGNOSIS — M542 Cervicalgia: Secondary | ICD-10-CM | POA: Diagnosis not present

## 2022-11-21 DIAGNOSIS — M545 Low back pain, unspecified: Secondary | ICD-10-CM | POA: Insufficient documentation

## 2022-11-21 DIAGNOSIS — Y92481 Parking lot as the place of occurrence of the external cause: Secondary | ICD-10-CM | POA: Diagnosis not present

## 2022-11-21 DIAGNOSIS — S199XXA Unspecified injury of neck, initial encounter: Secondary | ICD-10-CM | POA: Diagnosis present

## 2022-11-21 MED ORDER — IBUPROFEN 600 MG PO TABS: 600.0000 mg | ORAL_TABLET | Freq: Four times a day (QID) | ORAL | 0 refills | Status: DC | PRN

## 2022-11-21 MED ORDER — IBUPROFEN 400 MG PO TABS
600.0000 mg | ORAL_TABLET | Freq: Once | ORAL | Status: AC
  Administered 2022-11-21: 600 mg via ORAL
  Filled 2022-11-21: qty 1

## 2022-11-21 MED ORDER — LIDOCAINE 5 % EX PTCH
1.0000 | MEDICATED_PATCH | CUTANEOUS | Status: DC
  Administered 2022-11-21: 1 via TRANSDERMAL
  Filled 2022-11-21: qty 1

## 2022-11-21 MED ORDER — LIDOCAINE 5 % EX PTCH: 1.0000 | MEDICATED_PATCH | CUTANEOUS | 0 refills | Status: DC
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
## 2022-11-21 NOTE — ED Notes (Signed)
Discharge instructions, follow up care, and prescriptions reviewed and explained, pt verbalized understanding. Pt had no further questions. Pt caox4 and ambulatory on d/c.  
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
## 2022-12-17 ENCOUNTER — Encounter: Payer: Self-pay | Admitting: Obstetrics

## 2022-12-17 ENCOUNTER — Other Ambulatory Visit (HOSPITAL_COMMUNITY)
Admission: RE | Admit: 2022-12-17 | Discharge: 2022-12-17 | Disposition: A | Discharge status: Home / Self Care | Payer: Medicaid Other | Source: Ambulatory Visit | Attending: Obstetrics | Admitting: Obstetrics

## 2022-12-17 ENCOUNTER — Ambulatory Visit: Payer: Medicaid Other | Admitting: Obstetrics

## 2022-12-17 VITALS — BP 110/98 | Ht 67.0 in | Wt 176.0 lb

## 2022-12-17 DIAGNOSIS — N912 Amenorrhea, unspecified: Secondary | ICD-10-CM

## 2022-12-17 DIAGNOSIS — Z113 Encounter for screening for infections with a predominantly sexual mode of transmission: Secondary | ICD-10-CM | POA: Insufficient documentation

## 2022-12-17 DIAGNOSIS — Z3201 Encounter for pregnancy test, result positive: Secondary | ICD-10-CM | POA: Diagnosis not present

## 2022-12-17 DIAGNOSIS — N898 Other specified noninflammatory disorders of vagina: Secondary | ICD-10-CM | POA: Insufficient documentation

## 2022-12-17 LAB — POCT URINE PREGNANCY: Preg Test, Ur: POSITIVE — AB

## 2022-12-17 MED ORDER — ONDANSETRON 4 MG PO TBDP: 4.0000 mg | ORAL_TABLET | Freq: Four times a day (QID) | ORAL | 0 refills | Status: DC | PRN

## 2022-12-17 NOTE — Progress Notes (Addendum)
Obstetrics & Gynecology Office Visit   Chief Complaint:  Chief Complaint  Patient presents with   STD testing   Vaginal Discharge    Irritation, ammonia odor    History of Present Illness: Kaitlin Montgomery presents requesting STI screening. She has recently ended a relationship. As an asie, she reports her last period was 11/03/2022. She would like a pregnancy test. With past pregnancies, she develops migraines and nausea, and she has had both these complaints recently.she asks for zofran , ash she has had a number of episodes of vomiting. She has been sexually active, but has not used any contraception. She does not tolerate estrogen bearing birth control - develops headaches. She has not been using condoms.    Review of Systems:  Review of Systems  Constitutional: Negative.   Gastrointestinal:  Positive for nausea.  Genitourinary: Negative.   Musculoskeletal: Negative.   Skin: Negative.   Neurological:  Positive for headaches.  Endo/Heme/Allergies: Negative.   Psychiatric/Behavioral: Negative.       Past Medical History:  Past Medical History:  Diagnosis Date   Anxiety 06/10/2015   Bronchitis    Bronchitis    Depressed mood 07/01/2015   Exercise-induced asthma 04/09/2015   Keloid 02/11/2015   Seasonal allergies 04/09/2015    Past Surgical History:  Past Surgical History:  Procedure Laterality Date   EXTERNAL EAR SURGERY Right    gallbalder      Gynecologic History: Patient's last menstrual period was 11/03/2022 (exact date).  Obstetric History: [id number]  Family History:  Family History  Problem Relation Age of Onset   Lung cancer Maternal Grandmother    Breast cancer Paternal Grandmother        late years    Social History:  Social History   Socioeconomic History   Marital status: Single    Spouse name: Not on file   Number of children: 1   Years of education: 13   Highest education level: High school graduate  Occupational History   Not on file  Tobacco  Use   Smoking status: Former    Types: Cigars    Quit date: 08/25/2016    Years since quitting: 6.3   Smokeless tobacco: Former  Scientific laboratory technician Use: Never used  Substance and Sexual Activity   Alcohol use: No    Alcohol/week: 0.0 standard drinks of alcohol   Drug use: No   Sexual activity: Not Currently    Partners: Male    Birth control/protection: None  Other Topics Concern   Not on file  Social History Narrative   Lives with mom , sister 64yr, brother 161yrs.  Other sister 157  [place]   Social Determinants of Health   Financial Resource Strain: Not on file  Food Insecurity: Not on file  Transportation Needs: Not on file  Physical Activity: Not on file  Stress: Not on file  Social Connections: Not on file  Intimate Partner Violence: Not on file    Allergies:  No Known Allergies  Medications: Prior to Admission medications   Medication Sig Start Date End Date Taking? Authorizing Provider  ibuprofen (ADVIL) 600 MG tablet Take 1 tablet (600 mg total) by mouth every 6 (six) hours as needed. 11/21/22  Yes BGwenevere Abbot PA-C    Physical Exam Vitals:  Vitals:   12/17/22 1101  BP: (!) 110/98   Patient's last menstrual period was  11/03/2022 (exact date).  Physical Exam Constitutional:      Appearance: Normal appearance. She is normal weight.  HENT:     Head: Normocephalic and atraumatic.  Cardiovascular:     Rate and Rhythm: Normal rate and regular rhythm.  Pulmonary:     Effort: Pulmonary effort is normal.     Breath sounds: Normal breath sounds.  Genitourinary:    General: Normal vulva.     Rectum: Normal.     Comments: No external lesions or irritation. Speculum exam reveals some white, thin vaginal discharge. Aptima swab retrieved. Skin:    General: Skin is warm and dry.  Neurological:     Mental Status: She is alert.  Psychiatric:        Mood and Affect:  Mood normal.        Behavior: Behavior normal.   Urine pregnancy test today is POSITIVE   Assessment: 27 y.o. [id number] requesting STI testing. Positive pregnancy test- amenorrhea  Plan: Problem List Items Addressed This Visit   None Visit Diagnoses     Amenorrhea    -  Primary   Relevant Orders   POCT urine pregnancy   Routine screening for STI (sexually transmitted infection)         An Aptima swab is sent for her, and blood work is drawn for HIV, Hep B, and RPR. She is slightly tearful when informed of the pregnancy confirmation. Brief counseling provided, although she is quick to share that she will not continue the pregnancy. Discussed her options, including continuing the pregnancy and parenting, releasing for adoption, or opting for abortion. Encouraged to RTC as needed based on her decision. I have prescribed her some limited Zofran. I will contact her with the results of her labwork.  Imagene Riches, CNM  12/18/2022 6:04 PM

## 2022-12-18 ENCOUNTER — Other Ambulatory Visit: Payer: Self-pay | Admitting: Obstetrics

## 2022-12-18 ENCOUNTER — Encounter: Payer: Self-pay | Admitting: Obstetrics

## 2022-12-18 DIAGNOSIS — B9689 Other specified bacterial agents as the cause of diseases classified elsewhere: Secondary | ICD-10-CM

## 2022-12-18 LAB — CERVICOVAGINAL ANCILLARY ONLY
Bacterial Vaginitis (gardnerella): POSITIVE — AB
Candida Glabrata: NEGATIVE
Candida Vaginitis: POSITIVE — AB
Chlamydia: NEGATIVE
Comment: NEGATIVE
Comment: NEGATIVE
Comment: NEGATIVE
Comment: NEGATIVE
Comment: NEGATIVE
Comment: NORMAL
Neisseria Gonorrhea: NEGATIVE
Trichomonas: NEGATIVE

## 2022-12-18 LAB — HEP, RPR, HIV PANEL
HIV Screen 4th Generation wRfx: NONREACTIVE
Hepatitis B Surface Ag: NEGATIVE
RPR Ser Ql: NONREACTIVE

## 2022-12-18 MED ORDER — METRONIDAZOLE 500 MG PO TABS: 500.0000 mg | ORAL_TABLET | Freq: Two times a day (BID) | ORAL | 0 refills | Status: AC

## 2022-12-18 NOTE — Progress Notes (Signed)
Patient was seen in the office for vaginal discharge. She has tested + for both BV and yeast. I have sent in a RS for Metronidazole and she is advised to use Monistat cream for the yeast.  She had a + pregnancy test in the office and shared her intention to end the pregnancy. I have communicated the result of her Aptima via MyChart.  Imagene Riches, CNM  12/18/2022 4:07 PM

## 2023-01-26 ENCOUNTER — Telehealth: Payer: Self-pay

## 2023-01-26 NOTE — Telephone Encounter (Signed)
Schelly called triage on 01/02/2023 she went in for an abortion, she states that she's been bleeding like a normal period, like a dark red blood, and she went to [facility] in [place], what do you advise her to do.   I told her to call them back since they need the procedure.

## 2023-01-27 NOTE — Telephone Encounter (Signed)
Melora contacted me again this evening, she got in touch with [facility], they finally understood what she was telling them, they are going to see her Friday.

## 2023-01-28 DIAGNOSIS — O034 Incomplete spontaneous abortion without complication: Secondary | ICD-10-CM | POA: Diagnosis not present

## 2023-01-29 DIAGNOSIS — O034 Incomplete spontaneous abortion without complication: Secondary | ICD-10-CM | POA: Diagnosis not present

## 2023-02-01 NOTE — Telephone Encounter (Signed)
I contacted the patient, she is aware that the nurse has left for the day and will call her back tomorrow.

## 2023-02-01 NOTE — Telephone Encounter (Signed)
The patient is calling to speak with the nurse she last spoke with, the patient couldn't remember her name. Please advise?

## 2023-02-02 ENCOUNTER — Telehealth: Payer: Self-pay

## 2023-02-02 NOTE — Telephone Encounter (Signed)
Called Angell back after she called me yesterday as I was out of the office. She was letting me know she had anther D&C because tissue was left inside after her first D&C. She has a following up in April with [facility] and told her to keep that appointment.

## 2023-03-24 ENCOUNTER — Telehealth: Payer: Self-pay

## 2023-03-24 NOTE — Telephone Encounter (Signed)
Patient contacted office requesting antibiotic to treat for bacterial vaginosis and yeast infection. Patient states that she had an abortion in February and in March ( see ED note 01/28/23)  had to have procedure repeated because she states remains were left. Patient states that when she had procedure in March she was treated for BV and yeast and states that she never completed course of antibiotics. Patient reported on phone that she has had internal vaginal itching since March as well as milky white discharge, vaginal odor and burning. I advised patient that an office visit with a provider or nurse visit is needed to address symptoms and to test for possible vaginal infection or STI. Patient declined several times on phone to come in for office visit stating " I have BV and yeast all the time and is documented in my chart." Patient is requesting antibiotics be sent to pharmacy [facility] [address]). Please review chart and advise. KW

## 2023-03-25 NOTE — Telephone Encounter (Signed)
Left message to call back to schedule appointment. KW

## 2023-03-26 ENCOUNTER — Other Ambulatory Visit: Payer: Self-pay | Admitting: Obstetrics

## 2023-03-26 DIAGNOSIS — B9689 Other specified bacterial agents as the cause of diseases classified elsewhere: Secondary | ICD-10-CM

## 2023-03-26 MED ORDER — METRONIDAZOLE 0.75 % VA GEL: 1.0000 | Freq: Every day | VAGINAL | 0 refills | Status: AC

## 2023-03-26 NOTE — Telephone Encounter (Signed)
Pt called triage and is asking if flagyl refill can please be sent, she would like to try the cream this time. She has a hard time getting time off from work. Aware she is due for annual exam.

## 2023-03-26 NOTE — Progress Notes (Signed)
Patient has requested refills on her Metrogel. She has asked for na number of refills but has not had an Annual exam with our office, and has been seen only for problem visits. Office staff have notified her of the need to get a pap smear, STI screening and and a physical.  I have renewed Metrogel for her, and will also let he know that she cannot get renewed RXs until she has an Annual. Mirna Mires, CNM  03/26/2023 5:44 PM

## 2023-03-29 NOTE — Telephone Encounter (Signed)
Pt aware of Rx sent and annual needs to be scheduled.

## 2023-03-29 NOTE — Telephone Encounter (Signed)
I contacted patient via phone. I left voicemail for patient to call back to be scheduled.  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

## 2023-03-30 ENCOUNTER — Encounter: Payer: Self-pay | Admitting: Obstetrics

## 2023-03-30 NOTE — Telephone Encounter (Signed)
I was unable to reach patient via phone. Sending letter via mail.

## 2023-04-28 ENCOUNTER — Telehealth: Payer: Self-pay

## 2023-04-28 NOTE — Telephone Encounter (Signed)
Pt calling; was told to be seen if cream for BV didn't help; doesn't think it worked; is off today.  [phone]

## 2023-04-28 NOTE — Telephone Encounter (Signed)
I contacted the patient via phone. She is schedule for annual with [facility] on 7/17. The patient is requesting a call back from the nurse. Please advise?

## 2023-04-28 NOTE — Telephone Encounter (Signed)
Called pt to let her know I was going to send the message to [facility];  pt wants [facility] to know she got off track using the cream and taking the pill b/c of her work schedule but has no more. Also, pt is not sexually active at this time.  Pharm correct in chart.

## 2023-05-26 ENCOUNTER — Other Ambulatory Visit (HOSPITAL_COMMUNITY)
Admission: RE | Admit: 2023-05-26 | Discharge: 2023-05-26 | Disposition: A | Discharge status: Home / Self Care | Payer: 59 | Source: Ambulatory Visit | Attending: Obstetrics | Admitting: Obstetrics

## 2023-05-26 ENCOUNTER — Encounter: Payer: Self-pay | Admitting: Obstetrics

## 2023-05-26 ENCOUNTER — Other Ambulatory Visit: Payer: Self-pay | Admitting: Obstetrics

## 2023-05-26 ENCOUNTER — Ambulatory Visit (INDEPENDENT_AMBULATORY_CARE_PROVIDER_SITE_OTHER): Payer: 59 | Admitting: Obstetrics

## 2023-05-26 VITALS — BP 103/67 | HR 88 | Ht 67.0 in | Wt 180.0 lb

## 2023-05-26 DIAGNOSIS — Z01419 Encounter for gynecological examination (general) (routine) without abnormal findings: Secondary | ICD-10-CM | POA: Insufficient documentation

## 2023-05-26 DIAGNOSIS — Z124 Encounter for screening for malignant neoplasm of cervix: Secondary | ICD-10-CM

## 2023-05-26 DIAGNOSIS — Z01411 Encounter for gynecological examination (general) (routine) with abnormal findings: Secondary | ICD-10-CM

## 2023-05-26 DIAGNOSIS — Z113 Encounter for screening for infections with a predominantly sexual mode of transmission: Secondary | ICD-10-CM

## 2023-05-26 DIAGNOSIS — Z1151 Encounter for screening for human papillomavirus (HPV): Secondary | ICD-10-CM | POA: Diagnosis not present

## 2023-05-26 DIAGNOSIS — N760 Acute vaginitis: Secondary | ICD-10-CM | POA: Insufficient documentation

## 2023-05-26 DIAGNOSIS — B9689 Other specified bacterial agents as the cause of diseases classified elsewhere: Secondary | ICD-10-CM

## 2023-05-26 DIAGNOSIS — B3731 Acute candidiasis of vulva and vagina: Secondary | ICD-10-CM | POA: Insufficient documentation

## 2023-05-26 DIAGNOSIS — Z8619 Personal history of other infectious and parasitic diseases: Secondary | ICD-10-CM | POA: Insufficient documentation

## 2023-05-26 DIAGNOSIS — Z30011 Encounter for initial prescription of contraceptive pills: Secondary | ICD-10-CM

## 2023-05-26 DIAGNOSIS — Z202 Contact with and (suspected) exposure to infections with a predominantly sexual mode of transmission: Secondary | ICD-10-CM | POA: Diagnosis not present

## 2023-05-26 MED ORDER — NORETHINDRONE 0.35 MG PO TABS
1.0000 | ORAL_TABLET | Freq: Every day | ORAL | 4 refills | Status: DC
Start: 2023-05-26 — End: 2024-07-19

## 2023-05-26 MED ORDER — METRONIDAZOLE 0.75 % VA GEL
1.0000 | Freq: Every day | VAGINAL | 5 refills | Status: DC
Start: 2023-05-26 — End: 2024-03-17

## 2023-05-26 NOTE — Telephone Encounter (Signed)
Pt is seeing [facility] today.

## 2023-05-26 NOTE — Progress Notes (Signed)
Gynecology Annual Exam  PCP: Patient, No Pcp Per  Chief Complaint:  Chief Complaint  Patient presents with   Gynecologic Exam    Chest pain x couple of months    History of Present Illness:  [name] is a 27 y.o. [phone] who LMP was Patient's last menstrual period was 05/10/2023., presents today for her annual examination.   Her menses are usually regular, but she had an elective AB in February, and then weeks later was treated for retained products, sent to [facility], and then back to [facility] for a D and C procedure., .  Dysmenorrhea mild, occurring first 1-2 days of flow. She does not have intermenstrual bleeding.  She is not sexually active. Has had three different partners this year, but is  not presently active. 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 There is no FH of ovarian cancer. The patient does not do self-breast exams.  Tobacco use: The patient denies current or previous tobacco use. Alcohol use: social drinker Exercise: moderately active    The patient wears seatbelts: yes.   The patient reports that domestic violence in her life is absent.   Past Medical History:  Diagnosis Date   Anxiety 06/10/2015   Bronchitis    Bronchitis    Depressed mood 07/01/2015   Exercise-induced asthma 04/09/2015   Keloid 02/11/2015   Seasonal allergies 04/09/2015    Past Surgical History:  Procedure Laterality Date   EXTERNAL EAR SURGERY Right    gallbalder      Prior to Admission medications   Medication Sig Start Date End Date Taking? Authorizing Provider  metroNIDAZOLE (METROGEL) 0.75 % vaginal gel Place 1 Applicatorful vaginally at bedtime. Apply one applicatorful to vagina at bedtime for 10 days, then twice a week for 6 months. 05/26/23  Yes Mirna Mires, CNM  norethindrone (MICRONOR) 0.35 MG tablet  Take 1 tablet (0.35 mg total) by mouth daily. 05/26/23  Yes Mirna Mires, CNM    No Known Allergies  Gynecologic History: Patient's last menstrual period was 05/10/2023. History of abnormal pap smear: No History of STI: No   Obstetric History: [id number]  Social History   Socioeconomic History   Marital status: Single    Spouse name: Not on file   Number of children: 1   Years of education: 13   Highest education level: High school graduate  Occupational History   Not on file  Tobacco Use   Smoking status: Former    Types: Cigars    Quit date: 08/25/2016    Years since quitting: 6.7   Smokeless tobacco: Former  Building services engineer status: Never Used  Substance and Sexual Activity   Alcohol use: Yes   Drug use: No   Sexual activity: Not Currently    Partners: Male    Birth control/protection: None  Other Topics Concern   Not on file  Social History Narrative   Lives with mom , sister 35yrs, brother 13 yrs.  Other sister 71.  Was living in [place] for 3 years and now back in [place].  At [employer] in 10th grade.   Social Determinants of Health   Financial Resource Strain: Not on file  Food Insecurity: Not on file  Transportation Needs: Not on file  Physical Activity: Not on file  Stress: Not on file  Social Connections: Not on file  Intimate Partner Violence: Not on file    Family History  Problem Relation Age of Onset   Lung cancer Maternal Grandmother    Breast cancer Paternal Grandmother        late years    Review of Systems  Constitutional: Negative.   HENT: Negative.    Eyes: Negative.   Respiratory: Negative.    Cardiovascular: Negative.   Gastrointestinal: Negative.   Genitourinary: Negative.   Skin: Negative.   All other systems reviewed and are negative.    Physical Exam BP 103/67   Pulse 88   Ht 5\' 7"  (1.702 m)   Wt 180 lb (81.6 kg)   LMP 05/10/2023   Breastfeeding No   BMI 28.19 kg/m    Physical Exam Constitutional:       Appearance: Normal appearance.  Genitourinary:     Vulva normal.     Genitourinary Comments: Bar piercing noted at right side at top of clitoral hood. No external lesions White , thin, malodorous vaginal discharge noted- moderate amount Bimanual- uterus is non enlarged, mobile , midline, anteverted.  An irregular border is felt posteriorly on her left back of the vault ?fibroid. No tenderness noted.     Vaginal discharge present.  HENT:     Head: Normocephalic and atraumatic.  Cardiovascular:     Rate and Rhythm: Normal rate and regular rhythm.     Pulses: Normal pulses.     Heart sounds: Normal heart sounds.  Pulmonary:     Effort: Pulmonary effort is normal.     Breath sounds: Normal breath sounds.  Abdominal:     Palpations: Abdomen is soft.  Musculoskeletal:     Cervical back: Normal range of motion and neck supple.  Neurological:     Mental Status: She is alert.     Female chaperone present for pelvic and breast  portions of the physical exam  Results:  PHQ-9: 6 GAD 2   Assessment: 27 y.o. [phone] female here for routine annual gynecologic examination No contraceptive plan Recent EAB Malodorous vaginal discharge  Plan: Problem List Items Addressed This Visit   None Visit Diagnoses     BV (bacterial vaginosis)    -  Primary   Relevant Medications   metroNIDAZOLE (METROGEL) 0.75 % vaginal gel   Encounter for annual routine gynecological examination       Relevant Medications   norethindrone (MICRONOR) 0.35 MG tablet   Other Relevant Orders   Cervicovaginal ancillary only   Cytology - PAP   HEP, RPR, HIV Panel   Cervical cancer screening       Relevant Orders   Cytology - PAP   Screen for STD (sexually transmitted disease)       Relevant Orders   Cervicovaginal ancillary only   Cytology - PAP   HEP, RPR, HIV Panel   Encounter for initial prescription of contraceptive pills       Relevant Medications   norethindrone (MICRONOR) 0.35 MG tablet        Screening: -- Blood pressure screen normal -- Weight screening: normal -- Depression screening negative (PHQ-9) -- Nutrition: normal -- cholesterol screening: not due for screening -- osteoporosis screening: not due -- tobacco screening: not using -- alcohol screening: AUDIT questionnaire indicates low-risk usage. -- family history of breast cancer screening: done. not at high risk. -- no evidence of domestic violence or intimate partner violence. -- STD screening: gonorrhea/chlamydia NAAT collected -- pap smear collected per ASCCP guidelines --  flu vaccine  declines today -- HPV vaccination series: has not received - pt refuses  I have prescribed her some Metrogel while awaiting the swab results.Marland KitchenAptima sent today and will notify had of the results of testing. Blood work drawn today to ck for full STI screen. Condoms addvised. We did discuss contraception, and she agrees to [employer] as she feels that she is Estrogen sensitive.  F/U based on testing today.  Mirna Mires, CNM  05/26/2023 6:31 PM   05/26/2023 6:18 PM

## 2023-05-27 LAB — HEP, RPR, HIV PANEL
HIV Screen 4th Generation wRfx: NONREACTIVE
Hepatitis B Surface Ag: NEGATIVE
RPR Ser Ql: NONREACTIVE

## 2023-05-28 ENCOUNTER — Other Ambulatory Visit: Payer: Self-pay | Admitting: Obstetrics

## 2023-05-28 LAB — CERVICOVAGINAL ANCILLARY ONLY
Bacterial Vaginitis (gardnerella): POSITIVE — AB
Candida Glabrata: NEGATIVE
Candida Vaginitis: POSITIVE — AB
Chlamydia: NEGATIVE
Comment: NEGATIVE
Comment: NEGATIVE
Comment: NEGATIVE
Comment: NEGATIVE
Comment: NEGATIVE
Comment: NORMAL
Neisseria Gonorrhea: NEGATIVE
Trichomonas: NEGATIVE

## 2023-05-28 MED ORDER — FLUCONAZOLE 150 MG PO TABS: 150.0000 mg | ORAL_TABLET | Freq: Once | ORAL | 1 refills | Status: AC

## 2023-05-28 NOTE — Progress Notes (Signed)
Kaitlin Montgomery seen in the office for an Annual Gyn physical. Aptima swab indicates both yeast and BV. She was given Rxs for both after her visit.  Mirna Mires, CNM  05/28/2023 1:27 PM

## 2023-06-01 LAB — CYTOLOGY - PAP: Diagnosis: NEGATIVE

## 2023-06-02 ENCOUNTER — Ambulatory Visit: Payer: Medicaid Other | Admitting: Podiatry

## 2023-06-02 ENCOUNTER — Encounter: Payer: Self-pay | Admitting: Podiatry

## 2023-06-02 VITALS — BP 121/78 | HR 91

## 2023-06-02 DIAGNOSIS — B351 Tinea unguium: Secondary | ICD-10-CM | POA: Diagnosis not present

## 2023-06-02 DIAGNOSIS — B353 Tinea pedis: Secondary | ICD-10-CM | POA: Diagnosis not present

## 2023-06-02 MED ORDER — TERBINAFINE HCL 250 MG PO TABS: 250.0000 mg | ORAL_TABLET | Freq: Every day | ORAL | 0 refills | Status: AC

## 2023-06-02 NOTE — Progress Notes (Signed)
  Subjective:  Patient ID: Nicolette Bang, female    DOB: 30-Dec-1995,  MRN: [id number]  Chief Complaint  Patient presents with   Nail Problem    "I went to the nail salon and he told me I have fungus.  I have some dry skin on the bottom of my heel." N - fungus L - hallux left and heel left D - 1-2 months O - suddenly C - discolored, brittle A - none T - tried to cut it back    27 y.o. female presents with the above complaint. History confirmed with patient.   Objective:  Physical Exam: warm, good capillary refill, no trophic changes or ulcerative lesions, normal DP and PT pulses, normal sensory exam, and dry cracking peeling skin on heel, left hallux distal lateral quadrant onychomycosis  Assessment:   1. Onychomycosis   2. Tinea pedis of both feet      Plan:  Patient was evaluated and treated and all questions answered.  Onychomycosis -Educated on etiology of nail fungus and tinea pedis. -Discussed oral topical and laser therapy and risks and benefits of each -eRx for oral terbinafine #90. Educated on risks and benefits of the medication.     Return if symptoms worsen or fail to improve.

## 2023-06-07 ENCOUNTER — Other Ambulatory Visit: Payer: Self-pay

## 2023-06-07 DIAGNOSIS — B379 Candidiasis, unspecified: Secondary | ICD-10-CM

## 2023-06-07 MED ORDER — FLUCONAZOLE 150 MG PO TABS
150.0000 mg | ORAL_TABLET | Freq: Every day | ORAL | 0 refills | Status: AC
Start: 2023-06-07 — End: ?

## 2023-12-16 ENCOUNTER — Other Ambulatory Visit: Payer: Self-pay

## 2023-12-16 ENCOUNTER — Other Ambulatory Visit: Payer: Self-pay | Admitting: Certified Nurse Midwife

## 2023-12-16 DIAGNOSIS — N760 Acute vaginitis: Secondary | ICD-10-CM

## 2023-12-16 MED ORDER — METRONIDAZOLE 1 % EX GEL: Freq: Every day | CUTANEOUS | 0 refills | Status: DC

## 2023-12-16 MED ORDER — METRONIDAZOLE 500 MG PO TABS
500.0000 mg | ORAL_TABLET | Freq: Two times a day (BID) | ORAL | 0 refills | Status: DC
Start: 2023-12-16 — End: 2023-12-16

## 2023-12-16 NOTE — Progress Notes (Signed)
 Pt called with BV symptoms, she has a history of recurrent BV. I have sent in the metronidazole  to treat for now. . pt is to call and make an appointment with DR ROBY, she states BV always comes back. I advised her it could be a strain of BV that needs a different medication to treat that's why it keeps coming back. We could send a one swab and see.

## 2023-12-16 NOTE — Progress Notes (Signed)
 Pt called triage needing the metrogel  sent in to her pharmacy, not the pills. RX changed. Chalee Hirota cma

## 2024-01-24 ENCOUNTER — Ambulatory Visit (INDEPENDENT_AMBULATORY_CARE_PROVIDER_SITE_OTHER)

## 2024-01-24 VITALS — BP 114/69 | HR 103 | Wt 184.3 lb

## 2024-01-24 DIAGNOSIS — B9689 Other specified bacterial agents as the cause of diseases classified elsewhere: Secondary | ICD-10-CM

## 2024-01-24 DIAGNOSIS — N760 Acute vaginitis: Secondary | ICD-10-CM

## 2024-01-24 MED ORDER — METRONIDAZOLE 1 % EX GEL: Freq: Every day | CUTANEOUS | 0 refills | Status: DC

## 2024-01-24 NOTE — Progress Notes (Signed)
    NURSE VISIT NOTE  Subjective:    Patient ID: Kaitlin Montgomery, female    DOB: 1996-02-06, 28 y.o.   MRN: [id number]  HPI  Patient is a 28 y.o. [id number] female who presents for clear and milky vaginal discharge for several  year(s). Denies abnormal vaginal bleeding or significant pelvic pain or fever. denies . Patient denies history of known exposure to STD. Patient states that she's had this on going issue for awhile. Last seen Paula Compton, CNM  05/26/2023, Patient did state the Metrogel helps but after the course is completed it comes right back.    Objective:    BP 114/69 (BP Location: Left Arm, Patient Position: Sitting, Cuff Size: Normal)   Pulse (!) 103   Wt 184 lb 4.8 oz (83.6 kg)   BMI 28.87 kg/m    @THIS  VISIT ONLY@  Assessment:   1. BV (bacterial vaginosis)     bacterial vaginosis  Plan:   GC and chlamydia DNA  probe sent to lab. Treatment: waiting on swab results. Metrogel sent in  ROV prn if symptoms persist or worsen.   Burtis Junes, CMA

## 2024-01-26 ENCOUNTER — Other Ambulatory Visit: Payer: Self-pay

## 2024-01-26 DIAGNOSIS — B9689 Other specified bacterial agents as the cause of diseases classified elsewhere: Secondary | ICD-10-CM

## 2024-01-26 DIAGNOSIS — N760 Acute vaginitis: Secondary | ICD-10-CM

## 2024-01-26 LAB — NUSWAB VAGINITIS PLUS (VG+)
Candida albicans, NAA: POSITIVE — AB
Candida glabrata, NAA: NEGATIVE
Chlamydia trachomatis, NAA: NEGATIVE
Neisseria gonorrhoeae, NAA: NEGATIVE
Trich vag by NAA: NEGATIVE

## 2024-01-26 MED ORDER — FLUCONAZOLE 150 MG PO TABS: 150.0000 mg | ORAL_TABLET | Freq: Once | ORAL | 0 refills | Status: AC

## 2024-01-26 MED ORDER — METRONIDAZOLE 500 MG PO TABS: 500.0000 mg | ORAL_TABLET | Freq: Two times a day (BID) | ORAL | 0 refills | Status: DC

## 2024-01-26 NOTE — Progress Notes (Signed)
 Atopobium vaginae Moderate - 1  She states she has BV symptoms. With that result could she have metronidazole?

## 2024-03-17 ENCOUNTER — Other Ambulatory Visit: Payer: Self-pay

## 2024-03-17 DIAGNOSIS — B9689 Other specified bacterial agents as the cause of diseases classified elsewhere: Secondary | ICD-10-CM

## 2024-03-17 MED ORDER — METRONIDAZOLE 0.75 % VA GEL: 1.0000 | Freq: Every day | VAGINAL | 0 refills | Status: DC

## 2024-04-20 ENCOUNTER — Encounter: Payer: Self-pay | Admitting: Licensed Practical Nurse

## 2024-04-20 ENCOUNTER — Ambulatory Visit: Admitting: Licensed Practical Nurse

## 2024-04-20 ENCOUNTER — Other Ambulatory Visit (HOSPITAL_COMMUNITY)
Admission: RE | Admit: 2024-04-20 | Discharge: 2024-04-20 | Disposition: A | Discharge status: Home / Self Care | Source: Ambulatory Visit | Attending: Licensed Practical Nurse | Admitting: Licensed Practical Nurse

## 2024-04-20 VITALS — BP 117/75 | HR 86 | Ht 67.0 in | Wt 186.9 lb

## 2024-04-20 DIAGNOSIS — N760 Acute vaginitis: Secondary | ICD-10-CM | POA: Diagnosis not present

## 2024-04-20 DIAGNOSIS — Z3169 Encounter for other general counseling and advice on procreation: Secondary | ICD-10-CM | POA: Diagnosis not present

## 2024-04-20 DIAGNOSIS — Z113 Encounter for screening for infections with a predominantly sexual mode of transmission: Secondary | ICD-10-CM

## 2024-04-20 DIAGNOSIS — N898 Other specified noninflammatory disorders of vagina: Secondary | ICD-10-CM | POA: Insufficient documentation

## 2024-04-20 DIAGNOSIS — B9689 Other specified bacterial agents as the cause of diseases classified elsewhere: Secondary | ICD-10-CM

## 2024-04-20 MED ORDER — METRONIDAZOLE 500 MG PO TABS: 500.0000 mg | ORAL_TABLET | Freq: Two times a day (BID) | ORAL | 0 refills | Status: DC

## 2024-04-20 MED ORDER — METRONIDAZOLE 0.75 % VA GEL: 1.0000 | Freq: Every day | VAGINAL | 5 refills | Status: DC

## 2024-04-20 NOTE — Progress Notes (Signed)
 Patient, No Pcp Per   No chief complaint on file.   HPI:      Kaitlin Montgomery is a 28 y.o. [id number] whose LMP was Patient's last menstrual period was 04/11/2024 (approximate)., presents today for recurrent BV symptoms. Currently she has a vaginal odor x 1 week with itching on and off, denies bleeding between cycles or after IC.   Over the last 2 years Lizette has been treated for BV multiple times, her symptoms improve while on medication but return as soon as she stops. She has tried a long term course of Metrogel -this worked for a short time. She was last tested and treated for yeast in March. She does wear cotton underwear, sleeps with out underwear when able, does not douche. She did try Boric Acid for one day but then was worried it was not safe. She tried probiotics for a short time, felt it did not  help. She is sexually active with 1 female partner, they got married this past January. She stopped taking POP's 6 months because she had an unplanned pregnancy. She had a TAB. Now she and her husband are considering a pregnancy. He does not have any children, she has 2 young children.   Denies tobacco/nicotine or illicit drug use, barely uses alcohol only on special occasions-she noticed palpitations when consuming alcohol.    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   Gall bladder polyp 12/30/2016    Past Surgical History:  Procedure Laterality Date   EXTERNAL EAR SURGERY Right    gallbalder      Family History  Problem Relation Age of Onset   Lung cancer Maternal Grandmother    Breast cancer Paternal Grandmother        late years    Social History   Socioeconomic History   Marital status: Single    Spouse name: Not on file   Number of children: 1   Years of education: 13    Highest education level: High school graduate  Occupational History   Not on file  Tobacco Use   Smoking status: Never   Smokeless tobacco: Former  Building services engineer status: Never Used  Substance and Sexual Activity   Alcohol use: Yes    Alcohol/week: 1.0 standard drink of alcohol    Types: 1 Glasses of wine per week    Comment: once a month   Drug use: No   Sexual activity: Yes    Partners: Male    Birth control/protection: Pill  Other Topics Concern   Not on file  Social History Narrative   Lives with mom , sister 8yrs, brother 13 yrs.  Other sister 14.  Was living in [place] for 3 years and now back in [place].  At [employer]: [employer] in 10th grade.   Social Drivers of Corporate investment banker Strain: Not on file  Food Insecurity: Not on file  Transportation Needs: Not on file  Physical Activity: Not on file  Stress: Not on file  Social Connections: Not on file  Intimate Partner Violence: Not on file    Outpatient Medications Prior to Visit  Medication Sig Dispense Refill   norethindrone  (MICRONOR ) 0.35 MG tablet Take 1 tablet (0.35 mg total) by mouth daily. 84 tablet 4   fluconazole  (DIFLUCAN ) 150 MG tablet Take 1  tablet (150 mg total) by mouth daily. 1 tablet 0   metroNIDAZOLE  (FLAGYL ) 500 MG tablet Take 1 tablet (500 mg total) by mouth 2 (two) times daily. (Patient not taking: Reported on 04/20/2024) 14 tablet 0   metroNIDAZOLE  (METROGEL ) 0.75 % vaginal gel Place 1 Applicatorful vaginally at bedtime. Use nightly for 7 nights. (Patient not taking: Reported on 04/20/2024) 70 g 0   metroNIDAZOLE  (METROGEL ) 1 % gel Apply topically daily. (Patient not taking: Reported on 04/20/2024) 45 g 0   No facility-administered medications prior to visit.      ROS:  Review of Systems see HPI    OBJECTIVE:   Vitals:  BP 117/75 (BP Location: Right Arm, Patient Position: Sitting, Cuff Size: Normal)   Pulse 86   Ht 5' 7 (1.702 m)   Wt 186 lb 14.4 oz (84.8 kg)   LMP 04/11/2024  (Approximate)   BMI 29.27 kg/m   Physical Exam Constitutional:      Appearance: Normal appearance.   Cardiovascular:     Rate and Rhythm: Normal rate.  Pulmonary:     Effort: Pulmonary effort is normal.  Abdominal:     General: Abdomen is flat.     Tenderness: There is no abdominal tenderness.  Genitourinary:    General: Normal vulva.     Vagina: No vaginal discharge.     Comments: SSE: cervix pink no lesions, clear mucus like discharge present with some white discharge adherent  to cervix   White discharge present at introitus   Bimanual excam: uterus non tender no masses not enlarged, adnexa non tender no masses, not enlarged, no CMT   Musculoskeletal:     Cervical back: Normal range of motion.   Skin:    General: Skin is warm.   Neurological:     General: No focal deficit present.     Mental Status: She is alert.   Psychiatric:        Mood and Affect: Mood normal.        Thought Content: Thought content normal.     Results: No results found for this or any previous visit (from the past 24 hours).   Assessment/Plan: BV (bacterial vaginosis) - Plan: metroNIDAZOLE  (METROGEL ) 0.75 % vaginal gel, metroNIDAZOLE  (FLAGYL ) 500 MG tablet  Screening examination for venereal disease - Plan: HEP, RPR, HIV Panel, Hepatitis C antibody, Cervicovaginal ancillary only  Encounter for preconception consultation - Plan: Varicella zoster antibody, IgG, Rubella screen  Vaginal odor - Plan: Cervicovaginal ancillary only, Mycoplasma / Ureaplasma Culture    Meds ordered this encounter  Medications   metroNIDAZOLE  (METROGEL ) 0.75 % vaginal gel    Sig: Place 1 Applicatorful vaginally at bedtime. twice a week for 6 months.    Dispense:  70 g    Refill:  5   metroNIDAZOLE  (FLAGYL ) 500 MG tablet    Sig: Take 1 tablet (500 mg total) by mouth 2 (two) times daily.    Dispense:  14 tablet    Refill:  0   Will treat for recurrent BV: Flagyl  PO 500mg  BID  x 1 week followed by Boric  Acid 600mg  daily x 21 days followed by Metrogel  twice a week times 6 months  -Will treat for chronic yeast if swab returns with yeast  -continue vaginal probiotic, may try fermented foods and decreasing high sugar foods.   -Schedule annual exam   Berkley Breech [facility], CNM 04/20/2024 2:56 PM

## 2024-04-21 LAB — HEP, RPR, HIV PANEL
HIV Screen 4th Generation wRfx: NONREACTIVE
Hepatitis B Surface Ag: NEGATIVE
RPR Ser Ql: NONREACTIVE

## 2024-04-21 LAB — RUBELLA SCREEN: Rubella Antibodies, IGG: 4.13 {index} (ref >0.99)

## 2024-04-21 LAB — VARICELLA ZOSTER ANTIBODY, IGG: Varicella zoster IgG: REACTIVE

## 2024-04-21 LAB — HEPATITIS C ANTIBODY: Hep C Virus Ab: NONREACTIVE

## 2024-04-24 ENCOUNTER — Ambulatory Visit: Payer: Self-pay | Admitting: Licensed Practical Nurse

## 2024-04-25 ENCOUNTER — Other Ambulatory Visit: Payer: Self-pay | Admitting: Licensed Practical Nurse

## 2024-04-25 DIAGNOSIS — Z2239 Carrier of other specified bacterial diseases: Secondary | ICD-10-CM

## 2024-04-25 DIAGNOSIS — A493 Mycoplasma infection, unspecified site: Secondary | ICD-10-CM

## 2024-04-25 LAB — MYCOPLASMA / UREAPLASMA CULTURE
Mycoplasma hominis Culture: POSITIVE — AB
Ureaplasma urealyticum: POSITIVE — AB

## 2024-04-25 LAB — CERVICOVAGINAL ANCILLARY ONLY
Bacterial Vaginitis (gardnerella): POSITIVE — AB
Candida Glabrata: NEGATIVE
Candida Vaginitis: POSITIVE — AB
Chlamydia: NEGATIVE
Comment: NEGATIVE
Comment: NEGATIVE
Comment: NEGATIVE
Comment: NEGATIVE
Comment: NEGATIVE
Comment: NORMAL
Neisseria Gonorrhea: NEGATIVE
Trichomonas: NEGATIVE

## 2024-04-25 MED ORDER — DOXYCYCLINE HYCLATE 100 MG PO CAPS: 100.0000 mg | ORAL_CAPSULE | Freq: Two times a day (BID) | ORAL | 0 refills | Status: DC

## 2024-04-25 MED ORDER — FLUCONAZOLE 150 MG PO TABS: ORAL_TABLET | ORAL | 5 refills | Status: DC

## 2024-04-25 NOTE — Progress Notes (Signed)
 Pt seen for chronic BV/yeast, swab shows BV and yeast. Will treat with long term course. Mychart message sent to pt. Flagyl  previous ordered, Diflucan  ordered.  Anice Kerbs, CNM   [facility]  04/25/24  11:11 AM

## 2024-05-19 ENCOUNTER — Other Ambulatory Visit: Payer: Self-pay

## 2024-05-19 DIAGNOSIS — A493 Mycoplasma infection, unspecified site: Secondary | ICD-10-CM

## 2024-05-19 DIAGNOSIS — Z2239 Carrier of other specified bacterial diseases: Secondary | ICD-10-CM

## 2024-05-19 DIAGNOSIS — B9689 Other specified bacterial agents as the cause of diseases classified elsewhere: Secondary | ICD-10-CM

## 2024-05-19 MED ORDER — DOXYCYCLINE HYCLATE 100 MG PO CAPS: 100.0000 mg | ORAL_CAPSULE | Freq: Two times a day (BID) | ORAL | 0 refills | Status: DC

## 2024-05-19 MED ORDER — METRONIDAZOLE 500 MG PO TABS: 500.0000 mg | ORAL_TABLET | Freq: Two times a day (BID) | ORAL | 0 refills | Status: DC

## 2024-05-19 NOTE — Telephone Encounter (Signed)
 Medication was misplaced, one refill sent in. Made aware a repeat test will be needed for any future refills.

## 2024-07-19 ENCOUNTER — Other Ambulatory Visit

## 2024-07-19 ENCOUNTER — Other Ambulatory Visit: Payer: Self-pay

## 2024-07-19 ENCOUNTER — Ambulatory Visit (INDEPENDENT_AMBULATORY_CARE_PROVIDER_SITE_OTHER)

## 2024-07-19 VITALS — BP 103/62 | HR 80 | Ht 67.0 in | Wt 194.0 lb

## 2024-07-19 DIAGNOSIS — Z3687 Encounter for antenatal screening for uncertain dates: Secondary | ICD-10-CM

## 2024-07-19 DIAGNOSIS — Z3201 Encounter for pregnancy test, result positive: Secondary | ICD-10-CM

## 2024-07-19 DIAGNOSIS — N912 Amenorrhea, unspecified: Secondary | ICD-10-CM

## 2024-07-19 LAB — POCT URINE PREGNANCY: Preg Test, Ur: POSITIVE — AB

## 2024-07-19 NOTE — Progress Notes (Signed)
 Patient here for pregnancy confirmation with unknown LMP. Per Dr. Leigh, order a beta.

## 2024-07-19 NOTE — Patient Instructions (Signed)
 First Trimester of Pregnancy  The first trimester of pregnancy starts on the first day of your last monthly period until the end of week 13. This is months 1 through 3 of pregnancy. A week after a sperm fertilizes an egg, the egg will implant into the wall of the uterus and begin to develop into a baby. Body changes during your first trimester Your body goes through many changes during pregnancy. The changes usually return to normal after your baby is born. Physical changes Your breasts may grow larger and may hurt. The area around your nipples may get darker. Your periods will stop. Your hair and nails may grow faster. You may pee more often. Health changes You may tire easily. Your gums may bleed and may be sensitive when you brush and floss. You may not feel hungry. You may have heartburn. You may throw up or feel like you may throw up. You may want to eat some foods, but not others. You may have headaches. You may have trouble pooping (constipation). Other changes Your emotions may change from day to day. You may have more dreams. Follow these instructions at home: Medicines Talk to your health care provider if you're taking medicines. Ask if the medicines are safe to take during pregnancy. Your provider may change the medicines that you take. Do not take any medicines unless told to by your provider. Take a prenatal vitamin that has at least 600 micrograms (mcg) of folic acid. Do not use herbal medicines, illegal substances, or medicines that are not approved by your provider. Eating and drinking While you're pregnant your body needs extra food for your growing baby. Talk with your provider about what to eat while pregnant. Activity Most women are able to exercise during pregnancy. Exercises may need to change as your pregnancy goes on. Talk to your provider about your activities and exercise routines. Relieving pain and discomfort Wear a good, supportive bra if your breasts  hurt. Rest with your legs raised if you have leg cramps or low back pain. Safety Wear your seatbelt at all times when you're in a car. Talk to your provider if someone hits you, hurts you, or yells at you. Talk with your provider if you're feeling sad or have thoughts of hurting yourself. Lifestyle Certain things can be harmful while you're pregnant. Follow these rules: Do not use hot tubs, steam rooms, or saunas. Do not douche. Do not use tampons or scented pads. Do not drink alcohol,smoke, vape, or use products with nicotine or tobacco in them. If you need help quitting, talk with your provider. Avoid cat litter boxes and soil used by cats. These things carry germs that can cause harm to your pregnancy and your baby. General instructions Keep all follow-up visits. It helps you and your unborn baby stay as healthy as possible. Write down your questions. Take them to your visits. Your provider will: Talk with you about your overall health. Give you advice or refer you to specialists who can help with different needs, including: Prenatal education classes. Mental health and counseling. Foods and healthy eating. Ask for help if you need help with food. Call your dentist and ask to be seen. Brush your teeth with a soft toothbrush. Floss gently. Where to find more information American Pregnancy Association: americanpregnancy.org [employer]: acog.org Office on [employer] Health: [employer] Contact a health care provider if: You feel dizzy, faint, or have a fever. You vomit or have watery poop (diarrhea) for 2  days or more. You have abnormal discharge or bleeding from your vagina. You have pain when you pee or your pee smells bad. You have cramps, pain, or pressure in your belly area. Get help right away if: You have trouble breathing or chest pain. You have any kind of injury, such as from a fall or a car crash. These symptoms may be an  emergency. Get help right away. Call 911. Do not wait to see if the symptoms will go away. Do not drive yourself to the hospital. This information is not intended to replace advice given to you by your health care provider. Make sure you discuss any questions you have with your health care provider. Document Revised: 07/29/2023 Document Reviewed: 02/26/2023 Elsevier Patient Education  2024 Elsevier Inc.   Common Medications Safe in Pregnancy  Acne:      Constipation:  Benzoyl Peroxide     Colace  Clindamycin      Dulcolax Suppository  Topica Erythromycin     Fibercon  Salicylic Acid      Metamucil         Miralax AVOID:        Senakot   Accutane    Cough:  Retin-A       Cough Drops  Tetracycline      Phenergan w/ Codeine if Rx  Minocycline      Robitussin (Plain & DM)  Antibiotics:     Crabs/Lice:  Ceclor       RID  Cephalosporins    AVOID:  E-Mycins      Kwell  Keflex  Macrobid/Macrodantin   Diarrhea:  Penicillin      Kao-Pectate  Zithromax      Imodium AD         PUSH FLUIDS AVOID:       Cipro     Fever:  Tetracycline      Tylenol (Regular or Extra  Minocycline       Strength)  Levaquin      Extra Strength-Do not          Exceed 8 tabs/24 hrs Caffeine:        200mg /day (equiv. To 1 cup of coffee or  approx. 3 12 oz sodas)         Gas: Cold/Hayfever:       Gas-X  Benadryl      Mylicon  Claritin       Phazyme  **Claritin-D        Chlor-Trimeton    Headaches:  Dimetapp      ASA-Free Excedrin  Drixoral-Non-Drowsy     Cold Compress  Mucinex (Guaifenasin)     Tylenol (Regular or Extra  Sudafed/Sudafed-12 Hour     Strength)  **Sudafed PE Pseudoephedrine   Tylenol Cold & Sinus     Vicks Vapor Rub  Zyrtec  **AVOID if Problems With Blood Pressure         Heartburn: Avoid lying down for at least 1 hour after meals  Aciphex      Maalox     Rash:  Milk of Magnesia     Benadryl    Mylanta       1% Hydrocortisone Cream  Pepcid  Pepcid Complete   Sleep  Aids:  Prevacid      Ambien   Prilosec       Benadryl  Rolaids       Chamomile Tea  Tums (Limit 4/day)     Unisom  Tylenol PM         Warm milk-add vanilla or  Hemorrhoids:       Sugar for taste  Anusol/Anusol H.C.  (RX: Analapram 2.5%)  Sugar Substitutes:  Hydrocortisone OTC     Ok in moderation  Preparation H      Tucks        Vaseline lotion applied to tissue with wiping    Herpes:     Throat:  Acyclovir      Oragel  Famvir  Valtrex     Vaccines:         Flu Shot Leg Cramps:       *Gardasil  Benadryl      Hepatitis A         Hepatitis B Nasal Spray:       Pneumovax  Saline Nasal Spray     Polio Booster         Tetanus Nausea:       Tuberculosis test or PPD  Vitamin B6 25 mg TID   AVOID:    Dramamine      *Gardasil  Emetrol       Live Poliovirus  Ginger Root 250 mg QID    MMR (measles, mumps &  High Complex Carbs @ Bedtime    rebella)  Sea Bands-Accupressure    Varicella (Chickenpox)  Unisom 1/2 tab TID     *No known complications           If received before Pain:         Known pregnancy;   Darvocet       Resume series after  Lortab        Delivery  Percocet    Yeast:   Tramadol      Femstat  Tylenol 3      Gyne-lotrimin  Ultram       Monistat  Vicodin           MISC:         All Sunscreens           Hair Coloring/highlights          Insect Repellant's          (Including DEET)         Mystic Tans   Commonly Asked Questions During Pregnancy   Cats: A parasite can be excreted in cat feces.  To avoid exposure you need to have another person empty the little box.  If you must empty the litter box you will need to wear gloves.  Wash your hands after handling your cat.  This parasite can also be found in raw or undercooked meat so this should also be avoided.  Colds, Sore Throats, Flu: Please check your medication sheet to see what you can take for symptoms.  If your symptoms are unrelieved by these medications please call the office.  Dental Work: Most  any dental work Agricultural consultant recommends is permitted.  X-rays should only be taken during the first trimester if absolutely necessary.  Your abdomen should be shielded with a lead apron during all x-rays.  Please notify your provider prior to receiving any x-rays.  Novocaine is fine; gas is not recommended.  If your dentist requires a note from [place] prior to dental work please call the office and we will provide one for you.  Exercise: Exercise is an important part of staying healthy during your pregnancy.  You may continue most exercises you were accustomed to prior to pregnancy.  Later in your pregnancy you will most likely notice you have difficulty with activities requiring balance like riding a bicycle.  It is important that you listen to your body and avoid activities that put you at a higher risk of falling.  Adequate rest and staying well hydrated are a must!  If you have questions about the safety of specific activities ask your provider.    Exposure to Children with illness: Try to avoid obvious exposure; report any symptoms to [place] when noted,  If you have chicken pos, red measles or mumps, you should be immune to these diseases.   Please do not take any vaccines while pregnant unless you have checked with your OB provider.  Fetal Movement: After 28 weeks we recommend you do "kick counts" twice daily.  Lie or sit down in a calm quiet environment and count your baby movements "kicks".  You should feel your baby at least 10 times per hour.  If you have not felt 10 kicks within the first hour get up, walk around and have something sweet to eat or drink then repeat for an additional hour.  If count remains less than 10 per hour notify your provider.  Fumigating: Follow your pest control agent's advice as to how long to stay out of your home.  Ventilate the area well before re-entering.  Hemorrhoids:   Most over-the-counter preparations can be used during pregnancy.  Check your medication to see what is  safe to use.  It is important to use a stool softener or fiber in your diet and to drink lots of liquids.  If hemorrhoids seem to be getting worse please call the office.   Hot Tubs:  Hot tubs Jacuzzis and saunas are not recommended while pregnant.  These increase your internal body temperature and should be avoided.  Intercourse:  Sexual intercourse is safe during pregnancy as long as you are comfortable, unless otherwise advised by your provider.  Spotting may occur after intercourse; report any bright red bleeding that is heavier than spotting.  Labor:  If you know that you are in labor, please go to the hospital.  If you are unsure, please call the office and let us help you decide what to do.  Lifting, straining, etc:  If your job requires heavy lifting or straining please check with your provider for any limitations.  Generally, you should not lift items heavier than that you can lift simply with your hands and arms (no back muscles)  Painting:  Paint fumes do not harm your pregnancy, but may make you ill and should be avoided if possible.  Latex or water based paints have less odor than oils.  Use adequate ventilation while painting.  Permanents & Hair Color:  Chemicals in hair dyes are not recommended as they cause increase hair dryness which can increase hair loss during pregnancy.  " Highlighting" and permanents are allowed.  Dye may be absorbed differently and permanents may not hold as well during pregnancy.  Sunbathing:  Use a sunscreen, as skin burns easily during pregnancy.  Drink plenty of fluids; avoid over heating.  Tanning Beds:  Because their possible side effects are still unknown, tanning beds are not recommended.  Ultrasound Scans:  Routine ultrasounds are performed at approximately 20 weeks.  You will be able to see your baby's general anatomy an if you would like to know the gender this can usually be determined as well.  If it is questionable when you conceived you may  also  receive an ultrasound early in your pregnancy for dating purposes.  Otherwise ultrasound exams are not routinely performed unless there is a medical necessity.  Although you can request a scan we ask that you pay for it when conducted because insurance does not cover " patient request" scans.  Work: If your pregnancy proceeds without complications you may work until your due date, unless your physician or employer advises otherwise.  Round Ligament Pain/Pelvic Discomfort:  Sharp, shooting pains not associated with bleeding are fairly common, usually occurring in the second trimester of pregnancy.  They tend to be worse when standing up or when you remain standing for long periods of time.  These are the result of pressure of certain pelvic ligaments called "round ligaments".  Rest, Tylenol and heat seem to be the most effective relief.  As the womb and fetus grow, they rise out of the pelvis and the discomfort improves.  Please notify the office if your pain seems different than that described.  It may represent a more serious condition.

## 2024-07-19 NOTE — Progress Notes (Signed)
    NURSE VISIT NOTE  Subjective:    Patient ID: Kaitlin Montgomery, female    DOB: 1996/09/29, 28 y.o.   MRN: [id number]  HPI  Patient is a 28 y.o. [id number] female who presents for evaluation of amenorrhea. She believes she could be pregnant. Pregnancy is desired. Sexual Activity: has sex with males. Current symptoms also include: nausea and positive home pregnancy test. Last period was normal.    Objective:    BP 103/62   Pulse 80   Ht 5' 7 (1.702 m)   Wt 194 lb (88 kg)   LMP  (LMP Unknown)   BMI 30.38 kg/m   Lab Review  Results for orders placed or performed in visit on 07/19/24  POCT urine pregnancy  Result Value Ref Range   Preg Test, Ur Positive (A) Negative    Assessment:   1. Amenorrhea   2. Encounter for antenatal screening for uncertain dates     Plan:   Pregnancy Test: Positive  Encouraged well-balanced diet, plenty of rest when needed, pre-natal vitamins daily and walking for exercise.  Discussed self-help for nausea, avoiding OTC medications until consulting provider or pharmacist, other than Tylenol  as needed, minimal caffeine (1-2 cups daily) and avoiding alcohol.   She will schedule her nurse visit @ 7-[redacted] wks pregnant, u/s for dating @10  wk, and NOB visit at [redacted] wk pregnant.    Feel free to call with any questions. Unknown LMP, per Dr. Leigh order a beta.  Beola Skeens, CMA

## 2024-07-20 LAB — BETA HCG QUANT (REF LAB): hCG Quant: 80962 m[IU]/mL

## 2024-07-31 ENCOUNTER — Telehealth (INDEPENDENT_AMBULATORY_CARE_PROVIDER_SITE_OTHER)

## 2024-07-31 ENCOUNTER — Telehealth: Payer: Self-pay

## 2024-07-31 DIAGNOSIS — Z3689 Encounter for other specified antenatal screening: Secondary | ICD-10-CM

## 2024-07-31 DIAGNOSIS — Z3480 Encounter for supervision of other normal pregnancy, unspecified trimester: Secondary | ICD-10-CM

## 2024-07-31 NOTE — Telephone Encounter (Signed)
 Spoke with patient today on phone for NOB intake, she has already been scheduled for a ultrasound on 08/03/24 but has not been scheduled yet for her initial prenatal visit. Will you please contact patient and schedule? Thank you.  GA today- 12wk 2d EDD-02/10/2025

## 2024-07-31 NOTE — Telephone Encounter (Signed)
 Contacted the patient via phone, she is scheduled for 9/29 with [facility].

## 2024-07-31 NOTE — Patient Instructions (Signed)
 First Trimester of Pregnancy  The first trimester of pregnancy starts on the first day of your last monthly period until the end of week 13. This is months 1 through 3 of pregnancy. A week after a sperm fertilizes an egg, the egg will implant into the wall of the uterus and begin to develop into a baby. Body changes during your first trimester Your body goes through many changes during pregnancy. The changes usually return to normal after your baby is born. Physical changes Your breasts may grow larger and may hurt. The area around your nipples may get darker. Your periods will stop. Your hair and nails may grow faster. You may pee more often. Health changes You may tire easily. Your gums may bleed and may be sensitive when you brush and floss. You may not feel hungry. You may have heartburn. You may throw up or feel like you may throw up. You may want to eat some foods, but not others. You may have headaches. You may have trouble pooping (constipation). Other changes Your emotions may change from day to day. You may have more dreams. Follow these instructions at home: Medicines Talk to your health care provider if you're taking medicines. Ask if the medicines are safe to take during pregnancy. Your provider may change the medicines that you take. Do not take any medicines unless told to by your provider. Take a prenatal vitamin that has at least 600 micrograms (mcg) of folic acid. Do not use herbal medicines, illegal substances, or medicines that are not approved by your provider. Eating and drinking While you're pregnant your body needs extra food for your growing baby. Talk with your provider about what to eat while pregnant. Activity Most women are able to exercise during pregnancy. Exercises may need to change as your pregnancy goes on. Talk to your provider about your activities and exercise routines. Relieving pain and discomfort Wear a good, supportive bra if your breasts  hurt. Rest with your legs raised if you have leg cramps or low back pain. Safety Wear your seatbelt at all times when you're in a car. Talk to your provider if someone hits you, hurts you, or yells at you. Talk with your provider if you're feeling sad or have thoughts of hurting yourself. Lifestyle Certain things can be harmful while you're pregnant. Follow these rules: Do not use hot tubs, steam rooms, or saunas. Do not douche. Do not use tampons or scented pads. Do not drink alcohol,smoke, vape, or use products with nicotine or tobacco in them. If you need help quitting, talk with your provider. Avoid cat litter boxes and soil used by cats. These things carry germs that can cause harm to your pregnancy and your baby. General instructions Keep all follow-up visits. It helps you and your unborn baby stay as healthy as possible. Write down your questions. Take them to your visits. Your provider will: Talk with you about your overall health. Give you advice or refer you to specialists who can help with different needs, including: Prenatal education classes. Mental health and counseling. Foods and healthy eating. Ask for help if you need help with food. Call your dentist and ask to be seen. Brush your teeth with a soft toothbrush. Floss gently. Where to find more information American Pregnancy Association: americanpregnancy.org [employer]: acog.org Office on [employer] Health: [employer] Contact a health care provider if: You feel dizzy, faint, or have a fever. You vomit or have watery poop (diarrhea) for 2  days or more. You have abnormal discharge or bleeding from your vagina. You have pain when you pee or your pee smells bad. You have cramps, pain, or pressure in your belly area. Get help right away if: You have trouble breathing or chest pain. You have any kind of injury, such as from a fall or a car crash. These symptoms may be an  emergency. Get help right away. Call 911. Do not wait to see if the symptoms will go away. Do not drive yourself to the hospital. This information is not intended to replace advice given to you by your health care provider. Make sure you discuss any questions you have with your health care provider. Document Revised: 07/29/2023 Document Reviewed: 02/26/2023 Elsevier Patient Education  2024 Elsevier Inc.   Common Medications Safe in Pregnancy  Acne:      Constipation:  Benzoyl Peroxide     Colace  Clindamycin      Dulcolax Suppository  Topica Erythromycin     Fibercon  Salicylic Acid      Metamucil         Miralax AVOID:        Senakot   Accutane    Cough:  Retin-A       Cough Drops  Tetracycline      Phenergan w/ Codeine if Rx  Minocycline      Robitussin (Plain & DM)  Antibiotics:     Crabs/Lice:  Ceclor       RID  Cephalosporins    AVOID:  E-Mycins      Kwell  Keflex  Macrobid/Macrodantin   Diarrhea:  Penicillin      Kao-Pectate  Zithromax      Imodium AD         PUSH FLUIDS AVOID:       Cipro     Fever:  Tetracycline      Tylenol (Regular or Extra  Minocycline       Strength)  Levaquin      Extra Strength-Do not          Exceed 8 tabs/24 hrs Caffeine:        200mg /day (equiv. To 1 cup of coffee or  approx. 3 12 oz sodas)         Gas: Cold/Hayfever:       Gas-X  Benadryl      Mylicon  Claritin       Phazyme  **Claritin-D        Chlor-Trimeton    Headaches:  Dimetapp      ASA-Free Excedrin  Drixoral-Non-Drowsy     Cold Compress  Mucinex (Guaifenasin)     Tylenol (Regular or Extra  Sudafed/Sudafed-12 Hour     Strength)  **Sudafed PE Pseudoephedrine   Tylenol Cold & Sinus     Vicks Vapor Rub  Zyrtec  **AVOID if Problems With Blood Pressure         Heartburn: Avoid lying down for at least 1 hour after meals  Aciphex      Maalox     Rash:  Milk of Magnesia     Benadryl    Mylanta       1% Hydrocortisone Cream  Pepcid  Pepcid Complete   Sleep  Aids:  Prevacid      Ambien   Prilosec       Benadryl  Rolaids       Chamomile Tea  Tums (Limit 4/day)     Unisom  Tylenol PM         Warm milk-add vanilla or  Hemorrhoids:       Sugar for taste  Anusol/Anusol H.C.  (RX: Analapram 2.5%)  Sugar Substitutes:  Hydrocortisone OTC     Ok in moderation  Preparation H      Tucks        Vaseline lotion applied to tissue with wiping    Herpes:     Throat:  Acyclovir      Oragel  Famvir  Valtrex     Vaccines:         Flu Shot Leg Cramps:       *Gardasil  Benadryl      Hepatitis A         Hepatitis B Nasal Spray:       Pneumovax  Saline Nasal Spray     Polio Booster         Tetanus Nausea:       Tuberculosis test or PPD  Vitamin B6 25 mg TID   AVOID:    Dramamine      *Gardasil  Emetrol       Live Poliovirus  Ginger Root 250 mg QID    MMR (measles, mumps &  High Complex Carbs @ Bedtime    rebella)  Sea Bands-Accupressure    Varicella (Chickenpox)  Unisom 1/2 tab TID     *No known complications           If received before Pain:         Known pregnancy;   Darvocet       Resume series after  Lortab        Delivery  Percocet    Yeast:   Tramadol      Femstat  Tylenol 3      Gyne-lotrimin  Ultram       Monistat  Vicodin           MISC:         All Sunscreens           Hair Coloring/highlights          Insect Repellant's          (Including DEET)         Mystic Tans   Commonly Asked Questions During Pregnancy   Cats: A parasite can be excreted in cat feces.  To avoid exposure you need to have another person empty the little box.  If you must empty the litter box you will need to wear gloves.  Wash your hands after handling your cat.  This parasite can also be found in raw or undercooked meat so this should also be avoided.  Colds, Sore Throats, Flu: Please check your medication sheet to see what you can take for symptoms.  If your symptoms are unrelieved by these medications please call the office.  Dental Work: Most  any dental work Agricultural consultant recommends is permitted.  X-rays should only be taken during the first trimester if absolutely necessary.  Your abdomen should be shielded with a lead apron during all x-rays.  Please notify your provider prior to receiving any x-rays.  Novocaine is fine; gas is not recommended.  If your dentist requires a note from [place] prior to dental work please call the office and we will provide one for you.  Exercise: Exercise is an important part of staying healthy during your pregnancy.  You may continue most exercises you were accustomed to prior to pregnancy.  Later in your pregnancy you will most likely notice you have difficulty with activities requiring balance like riding a bicycle.  It is important that you listen to your body and avoid activities that put you at a higher risk of falling.  Adequate rest and staying well hydrated are a must!  If you have questions about the safety of specific activities ask your provider.    Exposure to Children with illness: Try to avoid obvious exposure; report any symptoms to [place] when noted,  If you have chicken pos, red measles or mumps, you should be immune to these diseases.   Please do not take any vaccines while pregnant unless you have checked with your OB provider.  Fetal Movement: After 28 weeks we recommend you do "kick counts" twice daily.  Lie or sit down in a calm quiet environment and count your baby movements "kicks".  You should feel your baby at least 10 times per hour.  If you have not felt 10 kicks within the first hour get up, walk around and have something sweet to eat or drink then repeat for an additional hour.  If count remains less than 10 per hour notify your provider.  Fumigating: Follow your pest control agent's advice as to how long to stay out of your home.  Ventilate the area well before re-entering.  Hemorrhoids:   Most over-the-counter preparations can be used during pregnancy.  Check your medication to see what is  safe to use.  It is important to use a stool softener or fiber in your diet and to drink lots of liquids.  If hemorrhoids seem to be getting worse please call the office.   Hot Tubs:  Hot tubs Jacuzzis and saunas are not recommended while pregnant.  These increase your internal body temperature and should be avoided.  Intercourse:  Sexual intercourse is safe during pregnancy as long as you are comfortable, unless otherwise advised by your provider.  Spotting may occur after intercourse; report any bright red bleeding that is heavier than spotting.  Labor:  If you know that you are in labor, please go to the hospital.  If you are unsure, please call the office and let us help you decide what to do.  Lifting, straining, etc:  If your job requires heavy lifting or straining please check with your provider for any limitations.  Generally, you should not lift items heavier than that you can lift simply with your hands and arms (no back muscles)  Painting:  Paint fumes do not harm your pregnancy, but may make you ill and should be avoided if possible.  Latex or water based paints have less odor than oils.  Use adequate ventilation while painting.  Permanents & Hair Color:  Chemicals in hair dyes are not recommended as they cause increase hair dryness which can increase hair loss during pregnancy.  " Highlighting" and permanents are allowed.  Dye may be absorbed differently and permanents may not hold as well during pregnancy.  Sunbathing:  Use a sunscreen, as skin burns easily during pregnancy.  Drink plenty of fluids; avoid over heating.  Tanning Beds:  Because their possible side effects are still unknown, tanning beds are not recommended.  Ultrasound Scans:  Routine ultrasounds are performed at approximately 20 weeks.  You will be able to see your baby's general anatomy an if you would like to know the gender this can usually be determined as well.  If it is questionable when you conceived you may  also  receive an ultrasound early in your pregnancy for dating purposes.  Otherwise ultrasound exams are not routinely performed unless there is a medical necessity.  Although you can request a scan we ask that you pay for it when conducted because insurance does not cover " patient request" scans.  Work: If your pregnancy proceeds without complications you may work until your due date, unless your physician or employer advises otherwise.  Round Ligament Pain/Pelvic Discomfort:  Sharp, shooting pains not associated with bleeding are fairly common, usually occurring in the second trimester of pregnancy.  They tend to be worse when standing up or when you remain standing for long periods of time.  These are the result of pressure of certain pelvic ligaments called "round ligaments".  Rest, Tylenol and heat seem to be the most effective relief.  As the womb and fetus grow, they rise out of the pelvis and the discomfort improves.  Please notify the office if your pain seems different than that described.  It may represent a more serious condition.

## 2024-07-31 NOTE — Progress Notes (Signed)
 New OB Intake  I connected with  Sena Kucher on 07/31/24 at  1:15 PM EDT by telephone Video Visit and verified that I am speaking with the correct person using two identifiers. Nurse is located at [facility] and pt is located at home.  I discussed the limitations, risks, security and privacy concerns of performing an evaluation and management service by telephone and the availability of in person appointments. I also discussed with the patient that there may be a patient responsible charge related to this service. The patient expressed understanding and agreed to proceed.  I explained I am completing New OB Intake today. We discussed her EDD of  that is based on LMP 05/06/2024. Pt is G5/P2. I reviewed her allergies, medications, Medical/Surgical/OB history, and appropriate screenings. There are cats in the home: no. Based on history, this is a/an pregnancy uncomplicated . Her obstetrical history is significant for none.  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  Anatomy US  Explained first scheduled US  will be 08/03/24. Anatomy US  will be scheduled around [redacted] weeks gestational age.  Labs Discussed genetic screening with patient. Patient yes genetic testing to be drawn at new OB visit. Discussed possible labs to be drawn at new OB appointment.  COVID Vaccine Patient has not had COVID vaccine.   Social Determinants of Health Food Insecurity: denies food insecurity WIC Referral: Patient is interested in referral to [facility].  Transportation: Patient denies transportation needs. Childcare: Discussed no children allowed at ultrasound appointments.   First visit  review I reviewed new OB appt with pt. I explained she will have blood work and pap smear/pelvic exam if indicated. Explained pt will be seen by Midwife at first visit; encounter routed to appropriate provider, and message sent to front staff to schedule patient for initial prenatal visit.\.   Rollo JINNY Maxin, CMA 07/31/2024  1:07 PM

## 2024-08-03 ENCOUNTER — Ambulatory Visit

## 2024-08-03 DIAGNOSIS — Z3687 Encounter for antenatal screening for uncertain dates: Secondary | ICD-10-CM

## 2024-08-03 DIAGNOSIS — O3680X0 Pregnancy with inconclusive fetal viability, not applicable or unspecified: Secondary | ICD-10-CM

## 2024-08-03 DIAGNOSIS — Z3A01 Less than 8 weeks gestation of pregnancy: Secondary | ICD-10-CM

## 2024-08-07 ENCOUNTER — Encounter: Admitting: Registered Nurse

## 2024-08-25 NOTE — Progress Notes (Deleted)
 NEW OB HISTORY AND PHYSICAL  SUBJECTIVE:       Kaitlin Montgomery is a 28 y.o. [phone] female, Patient's last menstrual period was 05/06/2024., Estimated Date of Delivery: 02/10/25, [redacted]w[redacted]d, presents today for establishment of Prenatal Care. She reports {:[id number]}   Social history Partner/Relationship: Living situation: Work: Exercise: Substance use:  Indications for ASA therapy (per uptodate) One of the following: Previous pregnancy with preeclampsia, especially early onset and with an adverse outcome {yes/no:20286} Multifetal gestation {yes/no:20286} Chronic hypertension {yes/no:20286} Type 1 or 2 diabetes mellitus {yes/no:20286} Chronic kidney disease {yes/no:20286} Autoimmune disease (antiphospholipid syndrome, systemic lupus erythematosus) {yes/no:20286}  Two or more of the following: Nulliparity {yes/no:20286} Obesity (body mass index >30 kg/m2) {yes/no:20286} Family history of preeclampsia in mother or sister {yes/no:20286} Age >=35 years {yes/no:20286} Sociodemographic characteristics (African American race, low socioeconomic level) {yes/no:20286} Personal risk factors (eg, previous pregnancy with low birth weight or small for gestational age infant, previous adverse pregnancy outcome [eg, stillbirth], interval >10 years between pregnancies) {yes/no:20286}  Indications for early GDM screening  First-degree relative with diabetes {yes/no:20286} BMI >30kg/m2 {yes/no:20286} Age > 35 {yes/no:20286} Previous birth of an infant weighing >=4000 g {yes/no:20286} Gestational diabetes mellitus in a previous pregnancy {yes/no:20286} Glycated hemoglobin >=5.7 percent (39 mmol/mol), impaired glucose tolerance, or impaired fasting glucose on previous testing {yes/no:20286} High-risk race/ethnicity (eg, African American, Latino, Native American, [place] American, [place] Islander) {yes/no:20286} Previous stillbirth of unknown cause {yes/no:20286} Maternal birthweight > 9 lbs  {yes/no:20286} History of cardiovascular disease {yes/no:20286} Hypertension or on therapy for hypertension {yes/no:20286} High-density lipoprotein cholesterol level <35 mg/dL (9.09 mmol/L) and/or a triglyceride level >250 mg/dL (7.17 mmol/L) {bzd/wn:79713} Polycystic ovary syndrome {yes/no:20286} Physical inactivity {yes/no:20286} Other clinical condition associated with insulin resistance (eg, severe obesity, acanthosis nigricans) {yes/no:20286} Current use of glucocorticoids {yes/no:20286}   Early screening tests: FBS, A1C, Random CBG, glucose challenge  Gynecologic History Patient's last menstrual period was 05/06/2024. {Blank multiple:19196::Normal,Abnormal,Unknown} Contraception: {method:5051} Last Pap:     Component Value Date/Time   DIAGPAP  05/26/2023 1434    - Negative for intraepithelial lesion or malignancy (NILM)   DIAGPAP  09/06/2019 1400    - Negative for intraepithelial lesion or malignancy (NILM)   HPVHIGH Negative 09/06/2019 1400   ADEQPAP  05/26/2023 1434    Satisfactory for evaluation; transformation zone component PRESENT.   ADEQPAP  09/06/2019 1400    Satisfactory for evaluation; transformation zone component PRESENT.  SABRA Results were: {norm/abn:16337}  Obstetric History OB History  Gravida Para Term Preterm AB Living  5 2 2  2 2   SAB IAB Ectopic Multiple Live Births  1 1  0 2    # Outcome Date GA Lbr Len/2nd Weight Sex Type Anes PTL Lv  5 Current           4 IAB 12/2022          3 Term 02/27/20 [redacted]w[redacted]d 03:05 / 00:28 10 lb 9.3 oz (4.8 kg) M Vag-Spont EPI  LIV  2 SAB 06/06/19 [redacted]w[redacted]d         1 Term 04/21/17 [redacted]w[redacted]d 05:41 / 00:48 7 lb 5.1 oz (3.32 kg) F Vag-Spont EPI  LIV    Past Medical History:  Diagnosis Date   Anxiety 06/10/2015   Bronchitis    Bronchitis    Depressed mood 07/01/2015   Exercise-induced asthma 04/09/2015   Keloid 02/11/2015   Seasonal allergies 04/09/2015    Past Surgical History:  Procedure Laterality Date   EXTERNAL EAR SURGERY  Right    gallbalder      Current  Outpatient Medications on File Prior to Visit  Medication Sig Dispense Refill   doxycycline  (VIBRAMYCIN ) 100 MG capsule Take 1 capsule (100 mg total) by mouth 2 (two) times daily. (Patient not taking: Reported on 07/19/2024) 20 capsule 0   metroNIDAZOLE  (METROGEL ) 0.75 % vaginal gel Place 1 Applicatorful vaginally at bedtime. twice a week for 6 months. 70 g 5   No current facility-administered medications on file prior to visit.    No Known Allergies  Social History   Socioeconomic History   Marital status: Single    Spouse name: Not on file   Number of children: 2   Years of education: 13   Highest education level: High school graduate  Occupational History   Not on file  Tobacco Use   Smoking status: Never   Smokeless tobacco: Former  Building services engineer status: Never Used  Substance and Sexual Activity   Alcohol use: Not Currently    Alcohol/week: 1.0 standard drink of alcohol    Types: 1 Glasses of wine per week   Drug use: No   Sexual activity: Yes    Partners: Male    Birth control/protection: None  Other Topics Concern   Not on file  Social History Narrative   Living with husband and two children.    Social Drivers of Corporate investment banker Strain: Low Risk  (07/31/2024)   Overall Financial Resource Strain (CARDIA)    Difficulty of Paying Living Expenses: Not hard at all  Food Insecurity: No Food Insecurity (07/31/2024)   Hunger Vital Sign    Worried About Running Out of Food in the Last Year: Never true    Ran Out of Food in the Last Year: Never true  Transportation Needs: No Transportation Needs (07/31/2024)   PRAPARE - Administrator, Civil Service (Medical): No    Lack of Transportation (Non-Medical): No  Physical Activity: Sufficiently Active (07/31/2024)   Exercise Vital Sign    Days of Exercise per Week: 7 days    Minutes of Exercise per Session: 30 min  Stress: No Stress Concern Present (07/31/2024)    [employer] of Occupational Health - Occupational Stress Questionnaire    Feeling of Stress: Not at all  Social Connections: Moderately Integrated (07/31/2024)   Social Connection and Isolation Panel    Frequency of Communication with Friends and Family: More than three times a week    Frequency of Social Gatherings with Friends and Family: More than three times a week    Attends Religious Services: More than 4 times per year    Active Member of [employer] or Organizations: No    Attends Banker Meetings: Never    Marital Status: Married  Catering manager Violence: Not At Risk (07/31/2024)   Humiliation, Afraid, Rape, and Kick questionnaire    Fear of Current or Ex-Partner: No    Emotionally Abused: No    Physically Abused: No    Sexually Abused: No    Family History  Problem Relation Age of Onset   Healthy Mother    Healthy Father    Healthy Sister    Healthy Sister    Healthy Sister    Healthy Brother    Healthy Brother    Healthy Brother    Lung cancer Maternal Grandmother    Breast cancer Paternal Grandmother        late years    The following portions of the patient's history were reviewed and updated as appropriate: allergies,  current medications, past OB history, past medical history, past surgical history, past family history, past social history, and problem list.  Constitutional: Denied constitutional symptoms, night sweats, recent illness, fatigue, fever, insomnia and weight loss.  Eyes: Denied eye symptoms, eye pain, photophobia, vision change and visual disturbance.  Ears/Nose/Throat/Neck: Denied ear, nose, throat or neck symptoms, hearing loss, nasal discharge, sinus congestion and sore throat.  Cardiovascular: Denied cardiovascular symptoms, arrhythmia, chest pain/pressure, edema, exercise intolerance, orthopnea and palpitations.  Respiratory: Denied pulmonary symptoms, asthma, pleuritic pain, productive sputum, cough, dyspnea and wheezing.   Gastrointestinal: Denied gastro-esophageal reflux, melena, nausea and vomiting.  Genitourinary:*** Denied genitourinary symptoms including symptomatic vaginal discharge, pelvic relaxation issues, and urinary complaints.  Musculoskeletal: Denied musculoskeletal symptoms, stiffness, swelling, muscle weakness and myalgia.  Dermatologic: Denied dermatology symptoms, rash and scar.  Neurologic: Denied neurology symptoms, dizziness, headache, neck pain and syncope.  Psychiatric: Denied psychiatric symptoms, anxiety and depression.  Endocrine: Denied endocrine symptoms including hot flashes and night sweats.     OBJECTIVE: Initial Physical Exam (New OB)  Physical Exam     ASSESSMENT: {pregnancy state assessment:313271::Normal pregnancy}   PLAN: Routine prenatal care. We discussed an overview of prenatal care and when to call. Reviewed diet, exercise, and weight gain recommendations in pregnancy. Discussed benefits of breastfeeding and lactation resources at [facility]. I reviewed labs and answered all questions.  There are no diagnoses linked to this encounter.  Burnard LITTIE Ro, CMA

## 2024-08-28 ENCOUNTER — Encounter: Admitting: Licensed Practical Nurse

## 2024-08-28 DIAGNOSIS — Z3A16 16 weeks gestation of pregnancy: Secondary | ICD-10-CM

## 2024-08-28 DIAGNOSIS — Z3480 Encounter for supervision of other normal pregnancy, unspecified trimester: Secondary | ICD-10-CM

## 2024-10-16 ENCOUNTER — Encounter (HOSPITAL_COMMUNITY): Payer: Self-pay

## 2024-10-16 ENCOUNTER — Other Ambulatory Visit: Payer: Self-pay

## 2024-10-16 ENCOUNTER — Emergency Department (HOSPITAL_COMMUNITY): Admission: EM | Admit: 2024-10-16 | Discharge: 2024-10-17 | Disposition: A

## 2024-10-16 DIAGNOSIS — J111 Influenza due to unidentified influenza virus with other respiratory manifestations: Secondary | ICD-10-CM

## 2024-10-16 LAB — CBC WITH DIFFERENTIAL/PLATELET
Abs Immature Granulocytes: 0.04 K/uL (ref 0.00–0.07)
Basophils Absolute: 0 K/uL (ref 0.0–0.1)
Basophils Relative: 0 %
Eosinophils Absolute: 0 K/uL (ref 0.0–0.5)
Eosinophils Relative: 0 %
HCT: 29.4 % — ABNORMAL LOW (ref 36.0–46.0)
Hemoglobin: 9.8 g/dL — ABNORMAL LOW (ref 12.0–15.0)
Immature Granulocytes: 1 %
Lymphocytes Relative: 9 %
Lymphs Abs: 0.6 K/uL — ABNORMAL LOW (ref 0.7–4.0)
MCH: 28.9 pg (ref 26.0–34.0)
MCHC: 33.3 g/dL (ref 30.0–36.0)
MCV: 86.7 fL (ref 80.0–100.0)
Monocytes Absolute: 0.5 K/uL (ref 0.1–1.0)
Monocytes Relative: 8 %
Neutro Abs: 5.6 K/uL (ref 1.7–7.7)
Neutrophils Relative %: 82 %
Platelets: 182 K/uL (ref 150–400)
RBC: 3.39 MIL/uL — ABNORMAL LOW (ref 3.87–5.11)
RDW: 13.2 % (ref 11.5–15.5)
WBC: 6.8 K/uL (ref 4.0–10.5)
nRBC: 0 % (ref 0.0–0.2)

## 2024-10-16 MED ORDER — ACETAMINOPHEN 500 MG PO TABS
1000.0000 mg | ORAL_TABLET | Freq: Once | ORAL | Status: AC
  Administered 2024-10-16: 1000 mg via ORAL
  Filled 2024-10-16: qty 2

## 2024-10-16 MED ORDER — OSELTAMIVIR PHOSPHATE 75 MG PO CAPS
75.0000 mg | ORAL_CAPSULE | Freq: Once | ORAL | Status: DC
  Filled 2024-10-16: qty 1

## 2024-10-16 MED ORDER — OSELTAMIVIR PHOSPHATE 75 MG PO CAPS: 75.0000 mg | ORAL_CAPSULE | Freq: Two times a day (BID) | ORAL | 0 refills | Status: AC

## 2024-10-16 MED ORDER — LACTATED RINGERS IV BOLUS
1000.0000 mL | Freq: Once | INTRAVENOUS | Status: AC
  Administered 2024-10-16: 1000 mL via INTRAVENOUS

## 2024-10-16 NOTE — ED Provider Triage Note (Signed)
 Emergency Medicine Provider Triage Evaluation Note  Mechille Varghese , a 28 y.o. female  was evaluated in triage.  Pt complains of fever, body aches, cough that started today. She is being seen here after kids were seen in peds ED and diagnosed with flu. Currently [redacted] weeks pregnant, has started prenatal care. No abdominal pain, vaginal bleeding or other pregnancy concerns.    HR noted to be tachycardic, fever 100.6. Feel influenza is very likely. Viral panel pending, Tylenol  provided, patient drinking fluids. HR to be rechecked prior to more aggressive work-up.   Review of Systems  Positive:  Negative:   Physical Exam  BP (!) 104/46 (BP Location: Right Arm)   Pulse (!) 142   Temp (!) 100.6 F (38.1 C)   Resp 20   Ht 5' 7 (1.702 m)   Wt 81.2 kg   LMP 05/06/2024   SpO2 100%   BMI 28.04 kg/m  Gen:   Awake, no distress   Resp:  Normal effort MSK:   Moves extremities without difficulty  Other:    Medical Decision Making  Medically screening exam initiated at 9:27 PM.  Appropriate orders placed.  Ericah Moree was informed that the remainder of the evaluation will be completed by another provider, this initial triage assessment does not replace that evaluation, and the importance of remaining in the ED until their evaluation is complete.     Odell Balls, PA-C 10/16/24 2134

## 2024-10-16 NOTE — ED Triage Notes (Addendum)
 Pt presents with cough, HA, body aches, chills that started yesterday. Pt's kids are positive for the flu. Pt is [redacted] weeks pregnant.

## 2024-10-16 NOTE — Discharge Instructions (Addendum)
 Please take the Tamiflu  as prescribed and follow-up with your OB/GYN.  Return to the ER for worsening symptoms.  Take Tylenol  as needed for body aches, headache, or fever.

## 2024-10-16 NOTE — ED Provider Notes (Signed)
 Kaitlin Montgomery EMERGENCY DEPARTMENT AT [facility] Provider Note   CSN: [id number] Arrival date & time: 10/16/24  2116     Patient presents with: No chief complaint on file.   Kaitlin Montgomery is a 28 y.o. female.   48-year-old female with no reported past medical history who is [redacted] weeks pregnant by first trimester ultrasound presenting to the emergency department today with flulike symptoms.  The patient states that she started feeling unwell yesterday.  Reports that she started with a headache, myalgias, cough, and some posttussive tussive emesis.  She states that she is coughing up some sputum.  She brought her 2 children to the pediatric ER today and they both tested positive for flu.  She came to the emergency department here after leaving for further evaluation.  She has been febrile intermittently.        Prior to Admission medications   Medication Sig Start Date End Date Taking? Authorizing Provider  oseltamivir  (TAMIFLU ) 75 MG capsule Take 1 capsule (75 mg total) by mouth every 12 (twelve) hours. 10/16/24  Yes Ula Prentice SAUNDERS, MD  doxycycline  (VIBRAMYCIN ) 100 MG capsule Take 1 capsule (100 mg total) by mouth 2 (two) times daily. Patient not taking: Reported on 07/19/2024 05/19/24   DominicJinnie Jansky, CNM  metroNIDAZOLE  (METROGEL ) 0.75 % vaginal gel Place 1 Applicatorful vaginally at bedtime. twice a week for 6 months. 05/20/24   Dominic, Jinnie Jansky, CNM    Allergies: Patient has no known allergies.    Review of Systems  Respiratory:  Positive for cough.   Gastrointestinal:  Positive for vomiting.  Musculoskeletal:  Positive for myalgias.  All other systems reviewed and are negative.   Updated Vital Signs BP 105/62   Pulse (!) 128   Temp (!) 100.6 F (38.1 C)   Resp 18   Ht 5' 7 (1.702 m)   Wt 81.2 kg   LMP 05/06/2024   SpO2 97%   BMI 28.04 kg/m   Physical Exam Vitals and nursing note reviewed.   Gen: NAD Eyes: PERRL, EOMI HEENT: no oropharyngeal  swelling, dry mucous membranes Neck: trachea midline, no meningismus Resp: clear to auscultation bilaterally Card: Tachycardic, no murmurs, rubs, or gallops Abd: nontender, nondistended Extremities: no calf tenderness, no edema Vascular: 2+ radial pulses bilaterally, 2+ DP pulses bilaterally Neuro: no focal deficits Skin: no rashes Psyc: acting appropriately   (all labs ordered are listed, but only abnormal results are displayed) Labs Reviewed  CBC WITH DIFFERENTIAL/PLATELET - Abnormal; Notable for the following components:      Result Value   RBC 3.39 (*)    Hemoglobin 9.8 (*)    HCT 29.4 (*)    Lymphs Abs 0.6 (*)    All other components within normal limits  RESP PANEL BY RT-PCR (RSV, FLU A&B, COVID)  [id number]  BASIC METABOLIC PANEL WITH GFR    EKG: EKG Interpretation Date/Time:  Monday October 16 2024 22:17:46 EST Ventricular Rate:  121 PR Interval:  148 QRS Duration:  81 QT Interval:  288 QTC Calculation: 409 R Axis:   79  Text Interpretation: Sinus tachycardia Borderline T wave abnormalities Baseline wander in lead(s) I II III aVR aVL aVF V1 V2 V3 V4 V5 V6 Confirmed by Ula Prentice [phone]) on 10/16/2024 10:54:39 PM  Radiology: No results found.   Procedures   Medications Ordered in the ED  oseltamivir  (TAMIFLU ) capsule 75 mg (has no administration in time range)  acetaminophen  (TYLENOL ) tablet 1,000 mg (1,000 mg Oral Given 10/16/24 2137)  lactated ringers  bolus 1,000 mL (1,000 mLs Intravenous New Bag/Given 10/16/24 2330)                                    Medical Decision Making 28 year old female with no reported past medical history who is [redacted] weeks pregnant by first trimester ultrasound presenting to the emergency department today with flulike symptoms.  The patient's children did just test positive for flu.  Given the patient's vomiting today and decreased oral intake will obtain basic labs to eval for AKI.  Will give patient IV fluids here.  Her EKG  interpreted by me does show a sinus tachycardia.  Her pulse ox is 97 to 100% on room air in the room.  With her children having flu I suspect is likely etiology.  Symptoms are more consistent with flulike illness.  She is denying any chest pain or significant shortness of breath with this so suspicion for pulmonary embolism, myocarditis, or pericarditis is low at this time.  I will give give the patient IV fluids and reassess for ultimate disposition.  The patient's CBC is unremarkable.  Hemoglobin is 9.8 but normal baseline is around 10.  The patient is given a dose of Tamiflu  here given her pregnancy status.  Plan is for reevaluation of vital signs for ultimate disposition.  If heart rate improves and renal function is within normal limits patient be discharged with return precautions.  This was pending at time of signout.  Amount and/or Complexity of Data Reviewed Labs: ordered.  Risk Prescription drug management.        Final diagnoses:  Influenza    ED Discharge Orders          Ordered    oseltamivir  (TAMIFLU ) 75 MG capsule  Every 12 hours        10/16/24 2357               Ula Prentice SAUNDERS, MD 10/16/24 2358

## 2024-10-17 LAB — RESP PANEL BY RT-PCR (RSV, FLU A&B, COVID)  RVPGX2
Influenza A by PCR: POSITIVE — AB
Influenza B by PCR: NEGATIVE
Resp Syncytial Virus by PCR: NEGATIVE
SARS Coronavirus 2 by RT PCR: NEGATIVE

## 2024-10-17 LAB — BASIC METABOLIC PANEL WITH GFR
Anion gap: 4 — ABNORMAL LOW (ref 5–15)
BUN: <5 mg/dL — ABNORMAL LOW (ref 6–20)
CO2: 28 mmol/L (ref 22–32)
Calcium: 8.6 mg/dL — ABNORMAL LOW (ref 8.9–10.3)
Chloride: 102 mmol/L (ref 98–111)
Creatinine, Ser: 0.55 mg/dL (ref 0.44–1.00)
GFR, Estimated: >60 mL/min (ref >60)
Glucose, Bld: 113 mg/dL — ABNORMAL HIGH (ref 70–99)
Potassium: 2.8 mmol/L — ABNORMAL LOW (ref 3.5–5.1)
Sodium: 134 mmol/L — ABNORMAL LOW (ref 135–145)

## 2024-10-17 MED ORDER — POTASSIUM CHLORIDE CRYS ER 20 MEQ PO TBCR
40.0000 meq | EXTENDED_RELEASE_TABLET | Freq: Once | ORAL | Status: AC
  Administered 2024-10-17: 40 meq via ORAL
  Filled 2024-10-17: qty 2

## 2024-10-17 NOTE — ED Provider Notes (Signed)
 Pt improved She is walking around in no distress HR improved Meds already prescribed by previous MD Pt safe for d/c We discussed strict ER Return precautions    Midge Golas, MD 10/17/24 0117

## 2024-10-19 ENCOUNTER — Ambulatory Visit: Payer: Self-pay

## 2024-10-19 NOTE — Telephone Encounter (Signed)
 FYI Only or Action Required?: FYI only for provider: Advise UC pt will call EMS.  Patient was last seen in primary care on .  Called Nurse Triage reporting Palpitations. Rapid heart rate - was 128 in hospital  Symptoms began several days ago.  Interventions attempted: Other: Seen at ED.  Symptoms are: unchanged.  Triage Disposition: See HCP Within 4 Hours (Or PCP Triage)  Patient/caregiver understands and will follow disposition?: No, refuses disposition - pt will call EMS                      Copied from CRM #[id number]. Topic: Clinical - Red Word Triage >> Oct 19, 2024  2:06 PM Tiffini S wrote: [facility] that prompted transfer to Nurse Triage: Patient was seen in the ED on 10/16/24/ have the Flu and fast/ high heart rate Reason for Disposition  [1] Heart beating very rapidly (e.g., > 140 / minute) AND [2] NOT present now  (Exception: Only during exercise.)  Answer Assessment - Initial Assessment Questions 1. DESCRIPTION: Please describe your heart rate or heartbeat that you are having (e.g., fast/slow, regular/irregular, skipped or extra beats, palpitations)     Pt states she thinks that her heart is beating very fast 2. ONSET: When did it start? (e.g., minutes, hours, days)      A few days ago 3. DURATION: How long does it last (e.g., seconds, minutes, hours)     Comes and goes for awhile 4. PATTERN Does it come and go, or has it been constant since it started?  Does it get worse with exertion?   Are you feeling it now?     Comes and goes 5. TAP: Using your hand, can you tap out what you are feeling on a chair or table in front of you, so that I can hear? Note: Not all patients can do this.       na 6. HEART RATE: Can you tell me your heart rate? How many beats in 15 seconds?  Note: Not all patients can do this.       No was 128 at ED 7. RECURRENT SYMPTOM: Have you ever had this before? If Yes, ask: When was the last time? and What  happened that time?      Fairly new 8. CAUSE: What do you think is causing the palpitations?     unsure 9. CARDIAC HISTORY: Do you have any history of heart disease? (e.g., heart attack, angina, bypass surgery, angioplasty, arrhythmia)      unsure 10. OTHER SYMPTOMS: Do you have any other symptoms? (e.g., dizziness, chest pain, sweating, difficulty breathing)       Has FLU 11. PREGNANCY: Is there any chance you are pregnant? When was your last menstrual period?       Yes pt is pregnant  Protocols used: Heart Rate and Heartbeat Questions-A-AH

## 2024-10-27 ENCOUNTER — Encounter: Payer: Self-pay | Admitting: Family Medicine

## 2024-10-27 ENCOUNTER — Ambulatory Visit: Admitting: Family Medicine

## 2024-10-27 VITALS — BP 104/72 | HR 113 | Resp 16 | Ht 67.0 in | Wt 180.0 lb

## 2024-10-27 DIAGNOSIS — E860 Dehydration: Secondary | ICD-10-CM | POA: Diagnosis not present

## 2024-10-27 DIAGNOSIS — D508 Other iron deficiency anemias: Secondary | ICD-10-CM | POA: Diagnosis not present

## 2024-11-30 DIAGNOSIS — D649 Anemia, unspecified: Secondary | ICD-10-CM | POA: Insufficient documentation

## 2024-11-30 DIAGNOSIS — E860 Dehydration: Secondary | ICD-10-CM | POA: Insufficient documentation

## 2024-11-30 NOTE — Assessment & Plan Note (Addendum)
 Outside labs taken 10/16/2024 shows anemia with a hemoglobin of 9.8.  She is taking a prenatal vitamin with iron and encouraged her to continue with that.  Follow-up in a month to get your hemoglobin checked.

## 2024-11-30 NOTE — Progress Notes (Signed)
 "  New Patient Office Visit  Subjective    Patient ID: Kaitlin Montgomery, female    DOB: 03-19-1996  Age: 29 y.o. MRN: [id number]  CC:  Chief Complaint  Patient presents with   Establish Care    Had flu 10/16/2024 had high and low BP and heart rate     HPI Kaitlin Montgomery presents to establish care Delightful 29 year old here to establish care.   During her third pregnancy she had right upper quadrant pain and an ultrasound showed polyps in her gallbladder.  She did not have stones.  Her weight got up to 193 but she is back down to 180 now. She reports she had the flu at the beginning of the month.  She tested positive 12/8 but did not take Tamiflu .  She took Tylenol  for her fever.  She reports she is still feeling bad from the flu.  Her heart rate remains high and her blood pressure is low.  Blood pressure today is 102/58 with a pulse of 94.  She has been trying to increase fluids.  She denies feeling lightheaded or dizzy when she stands up.  She is just very tired today.  She takes a gummy multivitamin. She did eat today about 3:30 she had Hibachi chicken and rice.  She does not have nausea.   She is already had a flu shot this year.  Past surgical history: Cosmetic surgery for keloid on her ear and wisdom teeth extracted. Labs 10/16/2024 show a hemoglobin of 9.8 and hematocrit of 29.4.  Sodium 134 and potassium 2.8.         Outpatient Encounter Medications as of 10/27/2024  Medication Sig   oseltamivir  (TAMIFLU ) 75 MG capsule Take 1 capsule (75 mg total) by mouth every 12 (twelve) hours.   Prenatal Vit-Fe Fumarate-FA (PRENATAL MULTIVITAMIN) TABS tablet Take 1 tablet by mouth daily at 12 noon.   [DISCONTINUED] metroNIDAZOLE  (METROGEL ) 0.75 % vaginal gel Place 1 Applicatorful vaginally at bedtime. twice a week for 6 months.   [DISCONTINUED] doxycycline  (VIBRAMYCIN ) 100 MG capsule Take 1 capsule (100 mg total) by mouth 2 (two) times daily. (Patient not taking: Reported on 10/27/2024)    No facility-administered encounter medications on file as of 10/27/2024.    Past Medical History:  Diagnosis Date   Anxiety 06/10/2015   Bronchitis    Bronchitis    Depressed mood 07/01/2015   Exercise-induced asthma 04/09/2015   Keloid 02/11/2015   Seasonal allergies 04/09/2015    Past Surgical History:  Procedure Laterality Date   EXTERNAL EAR SURGERY Right     Family History  Problem Relation Age of Onset   Healthy Mother    Healthy Father    Healthy Sister    Healthy Sister    Healthy Sister    Healthy Brother    Healthy Brother    Healthy Brother    Lung cancer Maternal Grandmother    Breast cancer Paternal Grandmother        late years    Social History   Socioeconomic History   Marital status: Single    Spouse name: Not on file   Number of children: 2   Years of education: 13   Highest education level: High school graduate  Occupational History   Not on file  Tobacco Use   Smoking status: Never   Smokeless tobacco: Former  Building Services Engineer status: Never Used  Substance and Sexual Activity   Alcohol use: Not Currently    Alcohol/week: 1.0 standard  drink of alcohol    Types: 1 Glasses of wine per week   Drug use: No   Sexual activity: Yes    Partners: Male    Birth control/protection: None  Other Topics Concern   Not on file  Social History Narrative   Living with husband and two children.    Social Drivers of Health   Tobacco Use: Medium Risk (10/27/2024)   Patient History    Smoking Tobacco Use: Never    Smokeless Tobacco Use: Former    Passive Exposure: Not on Actuary Strain: Low Risk (07/31/2024)   Overall Financial Resource Strain (CARDIA)    Difficulty of Paying Living Expenses: Not hard at all  Food Insecurity: No Food Insecurity (07/31/2024)   Epic    Worried About Radiation Protection Practitioner of Food in the Last Year: Never true    Ran Out of Food in the Last Year: Never true  Transportation Needs: No Transportation Needs  (07/31/2024)   Epic    Lack of Transportation (Medical): No    Lack of Transportation (Non-Medical): No  Physical Activity: Sufficiently Active (07/31/2024)   Exercise Vital Sign    Days of Exercise per Week: 7 days    Minutes of Exercise per Session: 30 min  Stress: No Stress Concern Present (07/31/2024)   [employer] of Occupational Health - Occupational Stress Questionnaire    Feeling of Stress: Not at all  Social Connections: Moderately Integrated (07/31/2024)   Social Connection and Isolation Panel    Frequency of Communication with Friends and Family: More than three times a week    Frequency of Social Gatherings with Friends and Family: More than three times a week    Attends Religious Services: More than 4 times per year    Active Member of Clubs or Organizations: No    Attends Banker Meetings: Never    Marital Status: Married  Catering Manager Violence: Not At Risk (07/31/2024)   Epic    Fear of Current or Ex-Partner: No    Emotionally Abused: No    Physically Abused: No    Sexually Abused: No  Depression (PHQ2-9): Low Risk (10/27/2024)   Depression (PHQ2-9)    PHQ-2 Score: 3  Alcohol Screen: Low Risk (07/31/2024)   Alcohol Screen    Last Alcohol Screening Score (AUDIT): 0  Housing: Low Risk (07/31/2024)   Epic    Unable to Pay for Housing in the Last Year: No    Number of Times Moved in the Last Year: 1    Homeless in the Last Year: No  Utilities: Not At Risk (07/31/2024)   Epic    Threatened with loss of utilities: No  Health Literacy: Adequate Health Literacy (07/31/2024)   B1300 Health Literacy    Frequency of need for help with medical instructions: Never    ROS      Objective   BP 104/72 (BP Location: Left Arm, Patient Position: Standing, Cuff Size: Normal)   Pulse (!) 113   Resp 16   Ht 5' 7 (1.702 m)   Wt 180 lb (81.6 kg)   LMP 05/06/2024   BMI 28.19 kg/m    Physical Exam Vitals and nursing note reviewed.  Constitutional:       Appearance: Normal appearance.  HENT:     Head: Normocephalic and atraumatic.  Eyes:     Conjunctiva/sclera: Conjunctivae normal.  Cardiovascular:     Rate and Rhythm: Normal rate and regular rhythm.  Pulmonary:  Effort: Pulmonary effort is normal.     Breath sounds: Normal breath sounds.  Musculoskeletal:     Right lower leg: No edema.     Left lower leg: No edema.  Skin:    General: Skin is warm and dry.  Neurological:     Mental Status: She is alert and oriented to person, place, and time.  Psychiatric:        Mood and Affect: Mood normal.        Behavior: Behavior normal.        Thought Content: Thought content normal.        Judgment: Judgment normal.            The ASCVD Risk score (Arnett DK, et al., 2019) failed to calculate for the following reasons:   The 2019 ASCVD risk score is only valid for ages 9 to 53   * - Cholesterol units were assumed     Assessment & Plan:   Dehydration Assessment & Plan: Has relatively low blood pressure and elevated heart rate likely related to dehydration and recent flu infection.  She is able to take fluids and ate a meal before she came in.  She denies any nausea.  She is not lightheaded and dizzy when she stands.  Continue to push fluids for the next few days.   Other iron deficiency anemia Assessment & Plan: Outside labs taken 10/16/2024 shows anemia with a hemoglobin of 9.8.  She is taking a prenatal vitamin with iron and encouraged her to continue with that.  Follow-up in a month to get your hemoglobin checked.       Return if symptoms worsen or fail to improve.   Cherrell Maybee K Treena Cosman, MD  "

## 2024-11-30 NOTE — Assessment & Plan Note (Signed)
 Has relatively low blood pressure and elevated heart rate likely related to dehydration and recent flu infection.  She is able to take fluids and ate a meal before she came in.  She denies any nausea.  She is not lightheaded and dizzy when she stands.  Continue to push fluids for the next few days.
